# Patient Record
Sex: Female | Born: 1990
Health system: Southern US, Community
[De-identification: ages and names within clinical notes are randomized; demographics above are authoritative.]

## PROBLEM LIST (undated history)

## (undated) DIAGNOSIS — O139 Gestational [pregnancy-induced] hypertension without significant proteinuria, unspecified trimester: Secondary | ICD-10-CM

## (undated) DIAGNOSIS — Z933 Colostomy status: Secondary | ICD-10-CM

## (undated) DIAGNOSIS — C189 Malignant neoplasm of colon, unspecified: Secondary | ICD-10-CM

## (undated) DIAGNOSIS — R188 Other ascites: Secondary | ICD-10-CM

## (undated) DIAGNOSIS — J40 Bronchitis, not specified as acute or chronic: Secondary | ICD-10-CM

## (undated) HISTORY — PX: COLON SURGERY: SHX602

---

## 2012-03-26 ENCOUNTER — Emergency Department (HOSPITAL_BASED_OUTPATIENT_CLINIC_OR_DEPARTMENT_OTHER): Payer: Self-pay

## 2012-03-26 ENCOUNTER — Emergency Department (HOSPITAL_BASED_OUTPATIENT_CLINIC_OR_DEPARTMENT_OTHER)
Admission: EM | Admit: 2012-03-26 | Discharge: 2012-03-26 | Disposition: A | Discharge status: Home / Self Care | Payer: Self-pay | Attending: Emergency Medicine | Admitting: Emergency Medicine

## 2012-03-26 ENCOUNTER — Encounter (HOSPITAL_BASED_OUTPATIENT_CLINIC_OR_DEPARTMENT_OTHER): Payer: Self-pay | Admitting: *Deleted

## 2012-03-26 DIAGNOSIS — J029 Acute pharyngitis, unspecified: Secondary | ICD-10-CM | POA: Insufficient documentation

## 2012-03-26 DIAGNOSIS — R509 Fever, unspecified: Secondary | ICD-10-CM | POA: Insufficient documentation

## 2012-03-26 DIAGNOSIS — IMO0001 Reserved for inherently not codable concepts without codable children: Secondary | ICD-10-CM | POA: Insufficient documentation

## 2012-03-26 HISTORY — DX: Gestational (pregnancy-induced) hypertension without significant proteinuria, unspecified trimester: O13.9

## 2012-03-26 HISTORY — DX: Bronchitis, not specified as acute or chronic: J40

## 2012-03-26 LAB — URINALYSIS, ROUTINE W REFLEX MICROSCOPIC
Bilirubin Urine: NEGATIVE
Ketones, ur: 15 mg/dL — AB
Nitrite: NEGATIVE
Specific Gravity, Urine: 1.036 — ABNORMAL HIGH (ref 1.005–1.030)
Urobilinogen, UA: 1 mg/dL (ref 0.0–1.0)

## 2012-03-26 LAB — CBC WITH DIFFERENTIAL/PLATELET
Basophils Absolute: 0 10*3/uL (ref 0.0–0.1)
Basophils Relative: 0 % (ref 0–1)
Eosinophils Absolute: 0 10*3/uL (ref 0.0–0.7)
Eosinophils Relative: 0 % (ref 0–5)
MCH: 28.3 pg (ref 26.0–34.0)
MCHC: 35 g/dL (ref 30.0–36.0)
MCV: 80.9 fL (ref 78.0–100.0)
Platelets: 200 10*3/uL (ref 150–400)
RDW: 13.3 % (ref 11.5–15.5)

## 2012-03-26 LAB — URINE MICROSCOPIC-ADD ON

## 2012-03-26 LAB — BASIC METABOLIC PANEL
Calcium: 9.7 mg/dL (ref 8.4–10.5)
GFR calc non Af Amer: >90 mL/min (ref >90)
Glucose, Bld: 101 mg/dL — ABNORMAL HIGH (ref 70–99)
Sodium: 132 mEq/L — ABNORMAL LOW (ref 135–145)

## 2012-03-26 LAB — RAPID STREP SCREEN (MED CTR MEBANE ONLY): Streptococcus, Group A Screen (Direct): NEGATIVE

## 2012-03-26 MED ORDER — ACETAMINOPHEN 325 MG PO TABS
650.0000 mg | ORAL_TABLET | Freq: Once | ORAL | Status: AC
  Administered 2012-03-26: 650 mg via ORAL
  Filled 2012-03-26: qty 2

## 2012-03-26 MED ORDER — ONDANSETRON HCL 4 MG/2ML IJ SOLN
4.0000 mg | Freq: Once | INTRAMUSCULAR | Status: AC
  Administered 2012-03-26: 4 mg via INTRAVENOUS
  Filled 2012-03-26: qty 2

## 2012-03-26 MED ORDER — SODIUM CHLORIDE 0.9 % IV BOLUS (SEPSIS)
1000.0000 mL | Freq: Once | INTRAVENOUS | Status: AC
  Administered 2012-03-26: 1000 mL via INTRAVENOUS

## 2012-03-26 MED ORDER — IBUPROFEN 800 MG PO TABS: 800.0000 mg | ORAL_TABLET | Freq: Three times a day (TID) | ORAL | Status: DC | PRN

## 2012-03-26 MED ORDER — IOHEXOL 350 MG/ML SOLN
100.0000 mL | Freq: Once | INTRAVENOUS | Status: AC | PRN
  Administered 2012-03-26: 100 mL via INTRAVENOUS

## 2012-03-26 MED ORDER — OXYCODONE-ACETAMINOPHEN 5-325 MG PO TABS: 1.0000 | ORAL_TABLET | Freq: Four times a day (QID) | ORAL | Status: DC | PRN

## 2012-03-26 MED ORDER — PENICILLIN G BENZATHINE 1200000 UNIT/2ML IM SUSP
1.2000 10*6.[IU] | Freq: Once | INTRAMUSCULAR | Status: AC
  Administered 2012-03-26: 1.2 10*6.[IU] via INTRAMUSCULAR
  Filled 2012-03-26: qty 2

## 2012-03-26 MED ORDER — MORPHINE SULFATE 4 MG/ML IJ SOLN
4.0000 mg | Freq: Once | INTRAMUSCULAR | Status: AC
  Administered 2012-03-26: 4 mg via INTRAVENOUS
  Filled 2012-03-26: qty 1

## 2012-03-26 MED ORDER — DEXAMETHASONE SODIUM PHOSPHATE 10 MG/ML IJ SOLN
10.0000 mg | Freq: Once | INTRAMUSCULAR | Status: AC
  Administered 2012-03-26: 10 mg via INTRAVENOUS
  Filled 2012-03-26: qty 1

## 2012-03-26 NOTE — ED Notes (Signed)
Sheldon MD at bedside. 

## 2012-03-26 NOTE — ED Notes (Signed)
Pt c/o sore throat and fever onset Wed. Seen at [facility] yest. Received IV, labs "didn't tell me anything". Unable to keep fluids down. Pressure in chest and SHOB. Not given antibiotics. BBS-diminished in bases. Throat red, swollen with white patches. Strep obtained.

## 2012-03-26 NOTE — ED Provider Notes (Signed)
History     CSN: [id number]  Arrival date & time 03/26/12  1325   First MD Initiated Contact with Patient 03/26/12 1341      Chief Complaint  Patient presents with  . Sore Throat    (Consider location/radiation/quality/duration/timing/severity/associated sxs/prior treatment) HPI Pt reports several days of fever, sore throat, general malaise and myalgias, associated with mild cough and SOB. Seen at [facility] yesterday for same. By their records has 'normal tonsils'. She has labs and CXR but those results are not in the record faxed to [place]. She was not given Abx in their ED. She has had continued symptoms worsening overnight and comes to the ED today for re-evaluation. She has severe aching pain worse with swallowing.   Past Medical History  Diagnosis Date  . Pregnancy induced hypertension   . Bronchitis     History reviewed. No pertinent past surgical history.  History reviewed. No pertinent family history.  History  Substance Use Topics  . Smoking status: Never Smoker   . Smokeless tobacco: Not on file  . Alcohol Use: No    OB History    Grav Para Term Preterm Abortions TAB SAB Ect Mult Living                  Review of Systems All other systems reviewed and are negative except as noted in HPI.   Allergies  Review of patient's allergies indicates no known allergies.  Home Medications  No current outpatient prescriptions on file.  BP 117/58  Pulse 128  Temp 102.2 F (39 C) (Oral)  Resp 24  Ht 5\' 4"  (1.626 m)  Wt 205 lb (92.987 kg)  BMI 35.19 kg/m2  SpO2 99%  LMP 02/24/2012  Physical Exam  Nursing note and vitals reviewed. Constitutional: She is oriented to person, place, and time. She appears well-developed and well-nourished.  HENT:  Head: Normocephalic and atraumatic.  Mouth/Throat: Oropharyngeal exudate present.       Bilateral tonsilar enlargement and exudate R>L, no trismus, mild hot-potato voice  Eyes: EOM are normal. Pupils are equal,  round, and reactive to light.  Neck: Normal range of motion. Neck supple.  Cardiovascular: Normal rate, normal heart sounds and intact distal pulses.   Pulmonary/Chest: Effort normal and breath sounds normal.  Abdominal: Bowel sounds are normal. She exhibits no distension. There is no tenderness.  Musculoskeletal: Normal range of motion. She exhibits no edema and no tenderness.  Lymphadenopathy:    She has cervical adenopathy.  Neurological: She is alert and oriented to person, place, and time. She has normal strength. No cranial nerve deficit or sensory deficit.  Skin: Skin is warm and dry. No rash noted.  Psychiatric: She has a normal mood and affect.    ED Course  Procedures (including critical care time)  Labs Reviewed  URINALYSIS, ROUTINE W REFLEX MICROSCOPIC - Abnormal; Notable for the following:    Color, Urine AMBER (*)  BIOCHEMICALS MAY BE AFFECTED BY COLOR   Specific Gravity, Urine 1.036 (*)     Ketones, ur 15 (*)     Protein, ur 100 (*)     Leukocytes, UA SMALL (*)     All other components within normal limits  CBC WITH DIFFERENTIAL - Abnormal; Notable for the following:    Monocytes Relative 14 (*)     Monocytes Absolute 1.1 (*)     All other components within normal limits  BASIC METABOLIC PANEL - Abnormal; Notable for the following:    Sodium 132 (*)  Potassium 3.4 (*)     Chloride 94 (*)     Glucose, Bld 101 (*)     All other components within normal limits  URINE MICROSCOPIC-ADD ON - Abnormal; Notable for the following:    Squamous Epithelial / LPF MANY (*)     Bacteria, UA FEW (*)     All other components within normal limits  RAPID STREP SCREEN  PREGNANCY, URINE  STREP A DNA PROBE   Ct Soft Tissue Neck W Contrast  03/26/2012  *RADIOLOGY REPORT*  Clinical Data: Sore throat and fever.  CT NECK WITH CONTRAST  Technique:  Multidetector CT imaging of the neck was performed with intravenous contrast.  Contrast: OMNIPAQUE IOHEXOL 350 MG/ML SOLN   Comparison: None.  Findings: Prominent tonsillar pillars and palatine tonsils noted with mild hypertrophy of the lingual tonsils.  Hypertrophic adenoidal tissue noted.  No drainable abscess observed.  No prevertebral/retropharyngeal phlegmon.  Epiglottis unremarkable.  Glottic structures normal.  Scattered station II and station III lymph nodes in the neck are likely reactive.  Lung apices unremarkable.  Upper mediastinum unremarkable.  Parapharyngeal spaces symmetric.  No dental periapical lucency observed.  IMPRESSION:  1.  Prominent tonsillar tissues in the neck without abscess. Reactive station II and station III lymph nodes in the neck. 2.  Epiglottis unremarkable.   Original Report Authenticated By: Dellia Cloud, M.D.      1. Pharyngitis       MDM  Pt with fever, tachycardia and signs of pharyngitis. Strep neg, will send for culture. CT to eval for PTA, IVF, pain medications and decadron.   3:27 PM Pt feeling better, CT and labs as above. Strep neg, but treat for presumptive strep.      Magaly Pollina B. Bernette Mayers, MD 03/26/12 1527

## 2012-03-28 LAB — STREP A DNA PROBE

## 2012-11-16 ENCOUNTER — Encounter (HOSPITAL_BASED_OUTPATIENT_CLINIC_OR_DEPARTMENT_OTHER): Payer: Self-pay | Admitting: *Deleted

## 2012-11-16 ENCOUNTER — Emergency Department (HOSPITAL_BASED_OUTPATIENT_CLINIC_OR_DEPARTMENT_OTHER)
Admission: EM | Admit: 2012-11-16 | Discharge: 2012-11-16 | Disposition: A | Discharge status: Home / Self Care | Payer: Self-pay | Attending: Emergency Medicine | Admitting: Emergency Medicine

## 2012-11-16 DIAGNOSIS — R6883 Chills (without fever): Secondary | ICD-10-CM | POA: Insufficient documentation

## 2012-11-16 DIAGNOSIS — Z8709 Personal history of other diseases of the respiratory system: Secondary | ICD-10-CM | POA: Insufficient documentation

## 2012-11-16 DIAGNOSIS — J029 Acute pharyngitis, unspecified: Secondary | ICD-10-CM | POA: Insufficient documentation

## 2012-11-16 DIAGNOSIS — R110 Nausea: Secondary | ICD-10-CM | POA: Insufficient documentation

## 2012-11-16 DIAGNOSIS — IMO0001 Reserved for inherently not codable concepts without codable children: Secondary | ICD-10-CM | POA: Insufficient documentation

## 2012-11-16 LAB — RAPID STREP SCREEN (MED CTR MEBANE ONLY): Streptococcus, Group A Screen (Direct): NEGATIVE

## 2012-11-16 MED ORDER — DYCLONINE HCL 3 MG MT LOZG: 1.0000 | LOZENGE | OROMUCOSAL | Status: DC | PRN

## 2012-11-16 MED ORDER — BENZOCAINE-MENTHOL 15-10 MG MT LOZG: 1.0000 | LOZENGE | OROMUCOSAL | Status: DC | PRN

## 2012-11-16 MED ORDER — IBUPROFEN 800 MG PO TABS
800.0000 mg | ORAL_TABLET | Freq: Once | ORAL | Status: AC
  Administered 2012-11-16: 800 mg via ORAL
  Filled 2012-11-16: qty 1

## 2012-11-16 MED ORDER — IBUPROFEN 600 MG PO TABS: 600.0000 mg | ORAL_TABLET | Freq: Four times a day (QID) | ORAL | Status: DC | PRN

## 2012-11-16 MED ORDER — ONDANSETRON 4 MG PO TBDP
4.0000 mg | ORAL_TABLET | Freq: Once | ORAL | Status: AC
  Administered 2012-11-16: 4 mg via ORAL
  Filled 2012-11-16: qty 1

## 2012-11-16 NOTE — ED Notes (Signed)
Pt c/o sore throat and sinus drainage since yesterday. Denies fever.

## 2012-11-16 NOTE — ED Provider Notes (Signed)
History     CSN: [id number]  Arrival date & time 11/16/12  0148   First MD Initiated Contact with Patient 11/16/12 0214      Chief Complaint  Patient presents with  . Sore Throat   HPI Amber Silva is a 22 y.o. female presenting with sore throat and myalgias since yesterday. She denies any fevers but has had some chills. She's taken nothing for her symptoms. She denies any cough, productive cough, congestion has had some rhinorrhea. Denies any chest pains, abdominal pain, vomiting or diarrhea. 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   Past Medical History  Diagnosis Date  . Pregnancy induced hypertension   . Bronchitis     History reviewed. No pertinent past surgical history.  History reviewed. No pertinent family history.  History  Substance Use Topics  . Smoking status: Never Smoker   . Smokeless tobacco: Not on file  . Alcohol Use: No    OB History   Grav Para Term Preterm Abortions TAB SAB Ect Mult Living                  Review of Systems At least 10pt or greater review of systems completed and are negative except where specified in the HPI.  Allergies  Review of patient's allergies indicates no known allergies.  Home Medications   Current Outpatient Rx  Name  Route  Sig  Dispense  Refill  . ibuprofen (ADVIL,MOTRIN) 800 MG tablet   Oral   Take 1 tablet (800 mg total) by mouth every 8 (eight) hours as needed for pain.   30 tablet   0   . oxyCODONE-acetaminophen (PERCOCET/ROXICET) 5-325 MG per tablet   Oral   Take 1-2 tablets by mouth every 6 (six) hours as needed for pain.   20 tablet   0     BP 125/83  Pulse 103  Temp(Src) 99.3 F (37.4 C) (Oral)  Resp 18  Ht 5\' 7"  (1.702 m)  Wt 200 lb (90.719 kg)  BMI 31.32 kg/m2  SpO2 98%  LMP  10/14/2012  Physical Exam  Nursing notes reviewed.  Electronic medical record reviewed. VITAL SIGNS:   Filed Vitals:   11/16/12 0152  BP: 125/83  Pulse: 103  Temp: 99.3 F (37.4 C)  TempSrc: Oral  Resp: 18  Height: 5\' 7"  (1.702 m)  Weight: 200 lb (90.719 kg)  SpO2: 98%   CONSTITUTIONAL: Awake, oriented, appears non-toxic HENT: Atraumatic, normocephalic, oral mucosa pink and moist, airway patent. Nasal turbinates are mildly inflamed. Mildly enlarged tonsils-no exudate, no erythema. Nares patent without drainage. External ears normal. EYES: Conjunctiva clear, EOMI, PERRLA NECK: Trachea midline, non-tender, supple, no cervical lymphadenopathy CARDIOVASCULAR: Normal heart rate, Normal rhythm, No murmurs, rubs, gallops PULMONARY/CHEST: Clear to auscultation, no rhonchi, wheezes, or rales. Symmetrical breath sounds. Non-tender. ABDOMINAL: Non-distended, soft, non-tender - no rebound or guarding.  BS normal. NEUROLOGIC: Non-focal, moving all four extremities, no gross sensory or motor deficits. EXTREMITIES: No clubbing, cyanosis, or edema SKIN: Warm, Dry, No erythema, No rash  ED Course  Procedures (including critical care time)  Labs Reviewed  RAPID STREP SCREEN   No results found.   1. Influenza-like illness   2. Pharyngitis       MDM  Patient presents with pharyngitis, and body aches that began abruptly. Consider influenza-like  illness versus viral pharyngitis versus allergic rhinitis.  Patient's throat does not appear to be irritated, rapid strep was negative. Do not think she's got a retropharyngeal, peritonsillar abscess or streptococcal infection of any kind-I. do not think he require treatment with antibiotics. We'll start symptomatic treatment with ibuprofen. Have discussed symptomatic treatment with ibuprofen and over-the-counter throat lozenges to control her sore throat pain, increasing fluid intake and taking it easy. No evidence for pneumonia, I do not think any  further workup needs to be done at this time. Patient is nontoxic, afebrile, vital signs are stable. Do not think her pulse rate of 103 is representative of any toxic sequelae.         Jones Skene, MD 11/16/12 0425

## 2014-02-11 ENCOUNTER — Encounter (HOSPITAL_BASED_OUTPATIENT_CLINIC_OR_DEPARTMENT_OTHER): Payer: Self-pay | Admitting: Emergency Medicine

## 2014-02-11 ENCOUNTER — Emergency Department (HOSPITAL_BASED_OUTPATIENT_CLINIC_OR_DEPARTMENT_OTHER): Payer: Medicaid Other

## 2014-02-11 ENCOUNTER — Emergency Department (HOSPITAL_BASED_OUTPATIENT_CLINIC_OR_DEPARTMENT_OTHER)
Admission: EM | Admit: 2014-02-11 | Discharge: 2014-02-11 | Disposition: A | Discharge status: Home / Self Care | Payer: Medicaid Other | Attending: Emergency Medicine | Admitting: Emergency Medicine

## 2014-02-11 DIAGNOSIS — R109 Unspecified abdominal pain: Secondary | ICD-10-CM | POA: Diagnosis present

## 2014-02-11 DIAGNOSIS — G44209 Tension-type headache, unspecified, not intractable: Secondary | ICD-10-CM | POA: Diagnosis not present

## 2014-02-11 DIAGNOSIS — Z791 Long term (current) use of non-steroidal anti-inflammatories (NSAID): Secondary | ICD-10-CM | POA: Diagnosis not present

## 2014-02-11 DIAGNOSIS — Z3202 Encounter for pregnancy test, result negative: Secondary | ICD-10-CM | POA: Insufficient documentation

## 2014-02-11 DIAGNOSIS — R000 Tachycardia, unspecified: Secondary | ICD-10-CM | POA: Insufficient documentation

## 2014-02-11 DIAGNOSIS — A084 Viral intestinal infection, unspecified: Secondary | ICD-10-CM

## 2014-02-11 DIAGNOSIS — A088 Other specified intestinal infections: Secondary | ICD-10-CM | POA: Insufficient documentation

## 2014-02-11 DIAGNOSIS — M549 Dorsalgia, unspecified: Secondary | ICD-10-CM | POA: Diagnosis not present

## 2014-02-11 DIAGNOSIS — R0789 Other chest pain: Secondary | ICD-10-CM | POA: Insufficient documentation

## 2014-02-11 DIAGNOSIS — Z8709 Personal history of other diseases of the respiratory system: Secondary | ICD-10-CM | POA: Diagnosis not present

## 2014-02-11 LAB — COMPREHENSIVE METABOLIC PANEL
ALT: 14 U/L (ref 0–35)
ANION GAP: 11 (ref 5–15)
AST: 21 U/L (ref 0–37)
Albumin: 2.4 g/dL — ABNORMAL LOW (ref 3.5–5.2)
Alkaline Phosphatase: 76 U/L (ref 39–117)
BILIRUBIN TOTAL: 0.4 mg/dL (ref 0.3–1.2)
BUN: 5 mg/dL — AB (ref 6–23)
CALCIUM: 9.2 mg/dL (ref 8.4–10.5)
CHLORIDE: 98 meq/L (ref 96–112)
CO2: 26 meq/L (ref 19–32)
CREATININE: 0.7 mg/dL (ref 0.50–1.10)
GLUCOSE: 108 mg/dL — AB (ref 70–99)
Potassium: 3.2 mEq/L — ABNORMAL LOW (ref 3.7–5.3)
Sodium: 135 mEq/L — ABNORMAL LOW (ref 137–147)
Total Protein: 8.8 g/dL — ABNORMAL HIGH (ref 6.0–8.3)

## 2014-02-11 LAB — URINE MICROSCOPIC-ADD ON

## 2014-02-11 LAB — LIPASE, BLOOD: LIPASE: 18 U/L (ref 11–59)

## 2014-02-11 LAB — CBC WITH DIFFERENTIAL/PLATELET
Basophils Absolute: 0 10*3/uL (ref 0.0–0.1)
Basophils Relative: 0 % (ref 0–1)
EOS PCT: 0 % (ref 0–5)
Eosinophils Absolute: 0 10*3/uL (ref 0.0–0.7)
HEMATOCRIT: 30.2 % — AB (ref 36.0–46.0)
HEMOGLOBIN: 10.3 g/dL — AB (ref 12.0–15.0)
LYMPHS PCT: 20 % (ref 12–46)
Lymphs Abs: 0.8 10*3/uL (ref 0.7–4.0)
MCH: 28 pg (ref 26.0–34.0)
MCHC: 34.1 g/dL (ref 30.0–36.0)
MCV: 82.1 fL (ref 78.0–100.0)
MONO ABS: 0.6 10*3/uL (ref 0.1–1.0)
MONOS PCT: 16 % — AB (ref 3–12)
NEUTROS ABS: 2.6 10*3/uL (ref 1.7–7.7)
Neutrophils Relative %: 64 % (ref 43–77)
Platelets: 290 10*3/uL (ref 150–400)
RBC: 3.68 MIL/uL — ABNORMAL LOW (ref 3.87–5.11)
RDW: 12.9 % (ref 11.5–15.5)
WBC: 4 10*3/uL (ref 4.0–10.5)

## 2014-02-11 LAB — URINALYSIS, ROUTINE W REFLEX MICROSCOPIC
Bilirubin Urine: NEGATIVE
GLUCOSE, UA: NEGATIVE mg/dL
HGB URINE DIPSTICK: NEGATIVE
KETONES UR: NEGATIVE mg/dL
LEUKOCYTES UA: NEGATIVE
Nitrite: NEGATIVE
PH: 6.5 (ref 5.0–8.0)
PROTEIN: 30 mg/dL — AB
Specific Gravity, Urine: 1.023 (ref 1.005–1.030)
Urobilinogen, UA: >8 mg/dL — ABNORMAL HIGH (ref 0.0–1.0)

## 2014-02-11 LAB — TROPONIN I

## 2014-02-11 LAB — PREGNANCY, URINE: Preg Test, Ur: NEGATIVE

## 2014-02-11 LAB — I-STAT CG4 LACTIC ACID, ED: Lactic Acid, Venous: 1.16 mmol/L (ref 0.5–2.2)

## 2014-02-11 MED ORDER — ACETAMINOPHEN 325 MG PO TABS
650.0000 mg | ORAL_TABLET | Freq: Once | ORAL | Status: AC
  Administered 2014-02-11: 650 mg via ORAL
  Filled 2014-02-11: qty 2

## 2014-02-11 MED ORDER — SODIUM CHLORIDE 0.9 % IV BOLUS (SEPSIS)
1000.0000 mL | Freq: Once | INTRAVENOUS | Status: AC
  Administered 2014-02-11: 1000 mL via INTRAVENOUS

## 2014-02-11 MED ORDER — ONDANSETRON HCL 4 MG PO TABS: 4.0000 mg | ORAL_TABLET | Freq: Four times a day (QID) | ORAL | Status: DC

## 2014-02-11 MED ORDER — IOHEXOL 300 MG/ML  SOLN
100.0000 mL | Freq: Once | INTRAMUSCULAR | Status: AC | PRN
  Administered 2014-02-11: 100 mL via INTRAVENOUS

## 2014-02-11 MED ORDER — POTASSIUM CHLORIDE CRYS ER 20 MEQ PO TBCR: 20.0000 meq | EXTENDED_RELEASE_TABLET | Freq: Every day | ORAL | Status: DC

## 2014-02-11 MED ORDER — IOHEXOL 300 MG/ML  SOLN
50.0000 mL | Freq: Once | INTRAMUSCULAR | Status: AC | PRN
  Administered 2014-02-11: 50 mL via ORAL

## 2014-02-11 MED ORDER — SODIUM CHLORIDE 0.9 % IV BOLUS (SEPSIS)
500.0000 mL | Freq: Once | INTRAVENOUS | Status: AC
  Administered 2014-02-11: 500 mL via INTRAVENOUS

## 2014-02-11 MED ORDER — POTASSIUM CHLORIDE CRYS ER 20 MEQ PO TBCR
40.0000 meq | EXTENDED_RELEASE_TABLET | Freq: Once | ORAL | Status: AC
  Administered 2014-02-11: 40 meq via ORAL
  Filled 2014-02-11: qty 2

## 2014-02-11 NOTE — ED Provider Notes (Signed)
CSN: [id number]     Arrival date & time 02/11/14  1010 History   First MD Initiated Contact with Patient 02/11/14 1021     Chief Complaint  Patient presents with  . Abdominal Pain  . Chest Pain     (Consider location/radiation/quality/duration/timing/severity/associated sxs/prior Treatment) The history is provided by the patient. No language interpreter was used.  Gaye Scorza is a 23 y/o F with PMHx of pregnancy induced HTN and bronchitis presenting to the ED with abdominal pain, nausea, vomiting, diarrhea that started on Friday. Patient reported that the abdominal pain is localized to the epigastric region described as an aching sensation that is intermittent, depending on the patient's position. Patient stated that sitting down for long periods of time make the pain worse, sitting up makes the pain feel better. Stated that with certain foods she has been having nausea and emesis - NB/NB. Stated that she has been having diarrhea of loose brownish-greenish color - denied mucus and blood. Stated that she has been having intermittent chills, but has not taken her temperature. Reported that when she woke up this morning she started to experience chest tightness sensation. Stated that she has started to develop a headache, stated on Thursday described as an intermittent headache that gets worse with light. Stated that she's been unable to keep any food or fluids down secondary to feeling extremely nauseous afterwards having episodes of emesis. Denied cough, nasal congestion, hematuria, dysuria, hematochezia, melena, shortness of breath, difficulty breathing, chest pain, travels, neck pain, neck stiffness, blurred vision, sudden loss of vision, worse headache of life, vaginal complaints. LMP 01/25/2014. Reported that she is stopped smoking approximately 2 weeks ago. Denied history of cancer clots. PCP [facility]  Past Medical History  Diagnosis Date  . Pregnancy induced hypertension   .  Bronchitis    No past surgical history on file. No family history on file. History  Substance Use Topics  . Smoking status: Never Smoker   . Smokeless tobacco: Not on file  . Alcohol Use: No   OB History   Grav Para Term Preterm Abortions TAB SAB Ect Mult Living                 Review of Systems  Constitutional: Positive for fever and chills.  HENT: Negative for ear discharge.   Respiratory: Positive for chest tightness. Negative for cough and shortness of breath.   Cardiovascular: Negative for chest pain.  Gastrointestinal: Positive for nausea, vomiting, abdominal pain and diarrhea. Negative for constipation, blood in stool and anal bleeding.  Genitourinary: Negative for dysuria, hematuria and vaginal bleeding.  Musculoskeletal: Positive for back pain. Negative for neck pain and neck stiffness.  Neurological: Positive for headaches. Negative for dizziness, weakness and numbness.      Allergies  Review of patient's allergies indicates no known allergies.  Home Medications   Prior to Admission medications   Medication Sig Start Date End Date Taking? Authorizing Provider  Benzocaine-Menthol (CHLORASEPTIC MAX SORE THROAT) 15-10 MG LOZG Use as directed 1 lozenge in the mouth or throat every 2 (two) hours as needed. 11/16/12   John-Adam Bonk, MD  Dyclonine HCl 3 MG LOZG Use as directed 1 lozenge (3 mg total) in the mouth or throat every 2 (two) hours as needed (sore throat). 11/16/12   John-Adam Bonk, MD  ibuprofen (ADVIL,MOTRIN) 600 MG tablet Take 1 tablet (600 mg total) by mouth every 6 (six) hours as needed for pain. 11/16/12   Rhunette Croft, MD  ibuprofen (ADVIL,MOTRIN) 800  MG tablet Take 1 tablet (800 mg total) by mouth every 8 (eight) hours as needed for pain. 03/26/12   Charles B. Karle Starch, MD  ondansetron (ZOFRAN) 4 MG tablet Take 1 tablet (4 mg total) by mouth every 6 (six) hours. 02/11/14   Vanellope Passmore, PA-C  oxyCODONE-acetaminophen (PERCOCET/ROXICET) 5-325 MG per tablet  Take 1-2 tablets by mouth every 6 (six) hours as needed for pain. 03/26/12   Charles B. Karle Starch, MD   BP 133/66  Pulse 97  Temp(Src) 99.9 F (37.7 C) (Oral)  Resp 18  SpO2 100%  LMP 01/25/2014 Physical Exam  Nursing note and vitals reviewed. Constitutional: She is oriented to person, place, and time. She appears well-developed and well-nourished. No distress.  HENT:  Head: Normocephalic and atraumatic.  Mouth/Throat: Oropharynx is clear and moist. No oropharyngeal exudate.  Negative trismus. Negative swelling, erythema, inflammation, lesions, sores, exudate or petechiae identified to the tonsils bilaterally and soft palate. Uvula midline with symmetrical elevation. Negative uvula deviation. Negative uvula swelling.  Eyes: Conjunctivae and EOM are normal. Pupils are equal, round, and reactive to light. Right eye exhibits no discharge. Left eye exhibits no discharge.  Neck: Normal range of motion. Neck supple. No tracheal deviation present.  Negative neck stiffness Negative nuchal rigidity Next cervical lymphadenopathy Negative meningeal signs  Cardiovascular: Regular rhythm and normal heart sounds.  Tachycardia present.  Exam reveals no friction rub.   No murmur heard. Pulses:      Radial pulses are 2+ on the right side, and 2+ on the left side.       Dorsalis pedis pulses are 2+ on the right side, and 2+ on the left side.  Mild tachycardia upon auscultation Cap refill less than 3 seconds  Pulmonary/Chest: Effort normal and breath sounds normal. No respiratory distress. She has no wheezes. She has no rales. Chest wall is not dull to percussion. She exhibits tenderness (pain reproducible upon palpation to the chest wall) and bony tenderness. She exhibits no mass, no laceration, no crepitus and no retraction.    Patient is able to speak in full sentences without difficulty Negative use of accessory muscles Negative stridor Negative trauma noted to the chest wall, negative ecchymosis  and swelling noted Discomfort upon palpation to the chest wall-center Negative crepitus upon palpation to the chest wall  Abdominal: Soft. Normal appearance. She exhibits no distension. There is tenderness in the right upper quadrant and epigastric area. There is no rebound, no guarding and no CVA tenderness.  Obese Negative abdominal distention noted Bowel sounds normal active in all 4 quadrants Abdomen soft upon palpation Discomfort upon palpation to the right upper quadrant, epigastric region Negative guarding or rigidity Negative peritoneal signs   Musculoskeletal: Normal range of motion. She exhibits no edema and no tenderness.  Full ROM to upper and lower extremities without difficulty noted, negative ataxia noted.  Lymphadenopathy:    She has no cervical adenopathy.  Neurological: She is alert and oriented to person, place, and time. No cranial nerve deficit. She exhibits normal muscle tone. Coordination normal.  Cranial nerves III-XII grossly intact Strength 5+/5+ to upper and lower extremities bilaterally with resistance applied, equal distribution noted Equal grip strength bilaterally Negative facial droop Negative slurred speech Negative aphasia Patient is able to bring finger to nose bilaterally without difficulty or ataxia Patient follows commands well Patient is able to answer questions appropriately Negative arm drift Heel to knee down shin normal bilaterally Gait proper, proper balance - negative sway, negative drift, negative step-offs  Skin:  Skin is warm and dry. No rash noted. No erythema.  Psychiatric: She has a normal mood and affect. Her behavior is normal. Thought content normal.    ED Course  Procedures (including critical care time)  11:39 PM Patient seen and assessed by attending physician, Dr. Rebeca Alert - as per attending physician agrees to plan of labs and imaging. Does not recommend CT head or LP at this - doubt SAH, meningitis.   1:47 PM This  provider discussed labs and imaging in great detail. Discussed with patient the recommendation of the CT scan of the abdomen and pelvis. Patient understood and agreed.   Results for orders placed during the hospital encounter of 02/11/14  PREGNANCY, URINE      Result Value Ref Range   Preg Test, Ur NEGATIVE  NEGATIVE  URINALYSIS, ROUTINE W REFLEX MICROSCOPIC      Result Value Ref Range   Color, Urine AMBER (*) YELLOW   APPearance CLEAR  CLEAR   Specific Gravity, Urine 1.023  1.005 - 1.030   pH 6.5  5.0 - 8.0   Glucose, UA NEGATIVE  NEGATIVE mg/dL   Hgb urine dipstick NEGATIVE  NEGATIVE   Bilirubin Urine NEGATIVE  NEGATIVE   Ketones, ur NEGATIVE  NEGATIVE mg/dL   Protein, ur 30 (*) NEGATIVE mg/dL   Urobilinogen, UA >8.0 (*) 0.0 - 1.0 mg/dL   Nitrite NEGATIVE  NEGATIVE   Leukocytes, UA NEGATIVE  NEGATIVE  CBC WITH DIFFERENTIAL      Result Value Ref Range   WBC 4.0  4.0 - 10.5 K/uL   RBC 3.68 (*) 3.87 - 5.11 MIL/uL   Hemoglobin 10.3 (*) 12.0 - 15.0 g/dL   HCT 30.2 (*) 36.0 - 46.0 %   MCV 82.1  78.0 - 100.0 fL   MCH 28.0  26.0 - 34.0 pg   MCHC 34.1  30.0 - 36.0 g/dL   RDW 12.9  11.5 - 15.5 %   Platelets 290  150 - 400 K/uL   Neutrophils Relative % 64  43 - 77 %   Neutro Abs 2.6  1.7 - 7.7 K/uL   Lymphocytes Relative 20  12 - 46 %   Lymphs Abs 0.8  0.7 - 4.0 K/uL   Monocytes Relative 16 (*) 3 - 12 %   Monocytes Absolute 0.6  0.1 - 1.0 K/uL   Eosinophils Relative 0  0 - 5 %   Eosinophils Absolute 0.0  0.0 - 0.7 K/uL   Basophils Relative 0  0 - 1 %   Basophils Absolute 0.0  0.0 - 0.1 K/uL  COMPREHENSIVE METABOLIC PANEL      Result Value Ref Range   Sodium 135 (*) 137 - 147 mEq/L   Potassium 3.2 (*) 3.7 - 5.3 mEq/L   Chloride 98  96 - 112 mEq/L   CO2 26  19 - 32 mEq/L   Glucose, Bld 108 (*) 70 - 99 mg/dL   BUN 5 (*) 6 - 23 mg/dL   Creatinine, Ser 0.70  0.50 - 1.10 mg/dL   Calcium 9.2  8.4 - 10.5 mg/dL   Total Protein 8.8 (*) 6.0 - 8.3 g/dL   Albumin 2.4 (*) 3.5 - 5.2 g/dL     AST 21  0 - 37 U/L   ALT 14  0 - 35 U/L   Alkaline Phosphatase 76  39 - 117 U/L   Total Bilirubin 0.4  0.3 - 1.2 mg/dL   GFR calc non Af Amer >90  >90 mL/min  GFR calc Af Amer >90  >90 mL/min   Anion gap 11  5 - 15  LIPASE, BLOOD      Result Value Ref Range   Lipase 18  11 - 59 U/L  TROPONIN I      Result Value Ref Range   Troponin I <0.30  <0.30 ng/mL  URINE MICROSCOPIC-ADD ON      Result Value Ref Range   Squamous Epithelial / LPF RARE  RARE   WBC, UA 0-2  <3 WBC/hpf   RBC / HPF 0-2  <3 RBC/hpf   Bacteria, UA RARE  RARE   Urine-Other MUCOUS PRESENT    I-STAT CG4 LACTIC ACID, ED      Result Value Ref Range   Lactic Acid, Venous 1.16  0.5 - 2.2 mmol/L    Labs Review Labs Reviewed  URINALYSIS, ROUTINE W REFLEX MICROSCOPIC - Abnormal; Notable for the following:    Color, Urine AMBER (*)    Protein, ur 30 (*)    Urobilinogen, UA >8.0 (*)    All other components within normal limits  CBC WITH DIFFERENTIAL - Abnormal; Notable for the following:    RBC 3.68 (*)    Hemoglobin 10.3 (*)    HCT 30.2 (*)    Monocytes Relative 16 (*)    All other components within normal limits  COMPREHENSIVE METABOLIC PANEL - Abnormal; Notable for the following:    Sodium 135 (*)    Potassium 3.2 (*)    Glucose, Bld 108 (*)    BUN 5 (*)    Total Protein 8.8 (*)    Albumin 2.4 (*)    All other components within normal limits  PREGNANCY, URINE  LIPASE, BLOOD  TROPONIN I  URINE MICROSCOPIC-ADD ON  I-STAT CG4 LACTIC ACID, ED    Imaging Review Dg Chest 2 View  02/11/2014   CLINICAL DATA:  23 year old female with chest pain and fever.  EXAM: CHEST  2 VIEW  COMPARISON:  03/25/2012 chest radiograph  FINDINGS: The cardiomediastinal silhouette is unremarkable.  The lungs are clear.  There is no evidence of focal airspace disease, pulmonary edema, suspicious pulmonary nodule/mass, pleural effusion, or pneumothorax. No acute bony abnormalities are identified.  IMPRESSION: No evidence of active  cardiopulmonary disease.   Electronically Signed   By: Hassan Rowan M.D.   On: 02/11/2014 13:28   US Abdomen Complete  02/11/2014   CLINICAL DATA:  Abdominal pain.  EXAM: ULTRASOUND ABDOMEN COMPLETE  COMPARISON:  None.  FINDINGS: Gallbladder:  No gallstones or wall thickening visualized. No sonographic Murphy sign noted.  Common bile duct:  Diameter: Normal, 2 mm.  Liver:  No focal lesion identified. Within normal limits in parenchymal echogenicity.  IVC:  No abnormality visualized.  Pancreas:  Visualized portion unremarkable.  Spleen:  Size and appearance within normal limits.  Right Kidney:  Length: 11.2 cm. Suspicion of increased echogenicity. No mass or hydronephrosis visualized.  Left Kidney:  Length: 11.2 cm.   No mass or hydronephrosis visualized.  Abdominal aorta:  No aneurysm visualized.  Other findings:  Small to moderate volume ascites.  IMPRESSION: 1. Small to moderate volume ascites, of indeterminate etiology. 2. Subtle increased renal echogenicity suggesting, especially on the right. Cannot exclude medical renal disease. 3. If there is no known explanation for ascites, abdominal pelvic CT should be considered.   Electronically Signed   By: Abigail Miyamoto M.D.   On: 02/11/2014 13:20   Ct Abdomen Pelvis W Contrast  02/11/2014   CLINICAL DATA:  Upper abdominal pain  EXAM: CT ABDOMEN AND PELVIS WITH CONTRAST  TECHNIQUE: Multidetector CT imaging of the abdomen and pelvis was performed using the standard protocol following bolus administration of intravenous contrast.  CONTRAST:  60mL OMNIPAQUE IOHEXOL 300 MG/ML SOLN, 169mL OMNIPAQUE IOHEXOL 300 MG/ML SOLN  COMPARISON:  Ultrasound from earlier in the same day  FINDINGS: Lung bases are free of acute infiltrate or sizable effusion.  The gallbladder is decompressed. The liver, spleen, adrenal glands and pancreas are within normal limits. The kidneys are well visualized and reveal no obstructive changes. The demonstrate a normal enhancement pattern. The appendix  is air-filled. Mild to moderate ascites is noted. The bladder is well distended. No pelvic mass lesion is seen. No significant lymphadenopathy is noted. No acute bony abnormality is noted.  IMPRESSION: Mild to moderate ascites without definitive etiology on this examination.  No other focal abnormality is seen.   Electronically Signed   By: Inez Catalina M.D.   On: 02/11/2014 15:23     EKG Interpretation   Date/Time:  Monday February 11 2014 10:27:33 EDT Ventricular Rate:  111 PR Interval:  142 QRS Duration: 82 QT Interval:  302 QTC Calculation: 410 R Axis:   42 Text Interpretation:  Sinus tachycardia Nonspecific T wave abnormality No  previous ECGs available Confirmed by [facility]  MD, Jenny Reichmann ([id number]) on 02/11/2014  2:01:33 PM      MDM   Final diagnoses:  Viral gastroenteritis  Tension-type headache, not intractable, unspecified chronicity pattern    Medications  potassium chloride SA (K-DUR,KLOR-CON) CR tablet 40 mEq (not administered)  sodium chloride 0.9 % bolus 1,000 mL (0 mLs Intravenous Stopped 02/11/14 1142)  acetaminophen (TYLENOL) tablet 650 mg (650 mg Oral Given 02/11/14 1143)  sodium chloride 0.9 % bolus 500 mL (0 mLs Intravenous Stopped 02/11/14 1418)  iohexol (OMNIPAQUE) 300 MG/ML solution 50 mL (50 mLs Oral Contrast Given 02/11/14 1400)  iohexol (OMNIPAQUE) 300 MG/ML solution 100 mL (100 mLs Intravenous Contrast Given 02/11/14 1502)   Filed Vitals:   02/11/14 1040 02/11/14 1130 02/11/14 1303 02/11/14 1657  BP: 136/89 119/79 128/70 133/66  Pulse: 105 102 95 97  Temp:   98.8 F (37.1 C) 99.9 F (37.7 C)  TempSrc:   Oral Oral  Resp: 13 15 18 18   SpO2: 100% 100% 100% 100%    EKG noted sinus tachycardia with a heart rate of 111 beats per minute with nonspecific T-wave abnormality-no previous EKGs for comparison. Troponin negative elevation. CBC negative elevated white blood cell count-negative left shift. Mildly low hemoglobin of 10.3, hematocrit 30.2-the patient appears to  be anemic. CMP noted mildly low potassium of 3.2. Negative anion gap. Lipase negative elevation. Urinalysis negative nitrites, leukocytes, hemoglobin-negative findings of infection. Lactic acid negative elevation. Urine pregnancy negative. Chest x-ray negative for acute cardiopulmonary disease. Abdominal ultrasound identified small-to-moderate volume ascites with unknown etiology-increased renal echogenicity localized to the right side cannot exclude medical renal disease-abdomen and pelvic CT recommended. CT abdomen pelvis with contrast noted a mild to moderate ascites without definitive etiology-no focal abnormalities Patient seen and assessed by attending physician, Dr. Rebeca Alert. Doubt cholecystitis/cholangitis. Doubt pancreatitis. Doubt appendicitis. Negative acute abdominal processes identified. Doubt meningitis. Doubt SAH. Doubt CVA. CT abdomen pelvis identified ascites without etiology. Labs unremarkable. Suspicion to be high for viral gastroenteritis secondary to nausea, vomiting, diarrhea. Suspicion of chest tightness appears to be GERD-like reaction. Patient tolerated fluids by mouth without difficulty-negative episodes of emesis while in ED setting. Patient's fever controlled in ED setting with  Tylenol. Heart rate decreased from 121 to 98 beats per minute after IV fluids administered. Patient stable, afebrile. Patient not septic appearing. Discharged patient. Referred to primary care provider. Discussed with patient to rest and stay hydrated. Discharged patient with zofran and potassium. Discussed with patient proper diet. Discussed with patient to closely monitor symptoms and if symptoms are to worsen or change to report back to the ED - strict return instructions given.  Patient agreed to plan of care, understood, all questions answered.   Jamse Mead, PA-C 02/11/14 1737

## 2014-02-11 NOTE — ED Provider Notes (Signed)
Medical screening examination/treatment/procedure(s) were conducted as a shared visit with non-physician practitioner(s) and myself.  I personally evaluated the patient during the encounter.   EKG Interpretation   Date/Time:  Monday February 11 2014 10:27:33 EDT Ventricular Rate:  111 PR Interval:  142 QRS Duration: 82 QT Interval:  302 QTC Calculation: 410 R Axis:   42 Text Interpretation:  Sinus tachycardia Nonspecific T wave abnormality No  previous ECGs available Confirmed by [facility]  MD, Jenny Reichmann ([id number]) on 02/11/2014  2:01:33 PM     5 days of fever generalized body aches with intermittent gradual onset headache that resolved with ibuprofen, 4 days of generalized body aches backache sharp stabbing chest pain worse with position changes and palpation as well as epigastric abdominal pain constant waxing and waning for the last 4 days with multiple episodes of nonbloody vomiting and diarrhea with minimal epigastric tenderness to examination with the rest of the abdomen nontender; no confusion no change in speech or vision no lateralizing weakness or numbness just generalized weakness with chills with no stiff neck no rash clinically doubt bacterial meningitis, SAH, CVA.  Babette Relic, MD 02/19/14 1255

## 2014-02-11 NOTE — Discharge Instructions (Signed)
Please call your doctor for a followup appointment within 24-48 hours. When you talk to your doctor please let them know that you were seen in the emergency department and have them acquire all of your records so that they can discuss the findings with you and formulate a treatment plan to fully care for your new and ongoing problems. Please call and set up an appointment with your primary care provider Please take Zofran as needed for nausea Please drink plenty of water and fluids Please avoid foods high in fat, degrees, carbohydrates Please take potassium tablets as prescribed-please have potassium rechecked within approximately 5 days Please continue to monitor symptoms closely and if symptoms are to worsen or change (fever greater than 101, chills, chest pain, shortness of breath, difficulty breathing, numbness, tingling, worsening or changes to pain pattern, blood in the stools, black tarry stools, weakness, numbness, syncope, headache, dizziness, blurred vision, sudden loss of vision, neck pain, neck stiffness, worsening or changes to abdominal pain, inability to keep food or fluids down) please report back to emergency department immediately  Viral Gastroenteritis Viral gastroenteritis is also known as stomach flu. This condition affects the stomach and intestinal tract. It can cause sudden diarrhea and vomiting. The illness typically lasts 3 to 8 days. Most people develop an immune response that eventually gets rid of the virus. While this natural response develops, the virus can make you quite ill. CAUSES  Many different viruses can cause gastroenteritis, such as rotavirus or noroviruses. You can catch one of these viruses by consuming contaminated food or water. You may also catch a virus by sharing utensils or other personal items with an infected person or by touching a contaminated surface. SYMPTOMS  The most common symptoms are diarrhea and vomiting. These problems can cause a severe loss  of body fluids (dehydration) and a body salt (electrolyte) imbalance. Other symptoms may include:  Fever.  Headache.  Fatigue.  Abdominal pain. DIAGNOSIS  Your caregiver can usually diagnose viral gastroenteritis based on your symptoms and a physical exam. A stool sample may also be taken to test for the presence of viruses or other infections. TREATMENT  This illness typically goes away on its own. Treatments are aimed at rehydration. The most serious cases of viral gastroenteritis involve vomiting so severely that you are not able to keep fluids down. In these cases, fluids must be given through an intravenous line (IV). HOME CARE INSTRUCTIONS   Drink enough fluids to keep your urine clear or pale yellow. Drink small amounts of fluids frequently and increase the amounts as tolerated.  Ask your caregiver for specific rehydration instructions.  Avoid:  Foods high in sugar.  Alcohol.  Carbonated drinks.  Tobacco.  Juice.  Caffeine drinks.  Extremely hot or cold fluids.  Fatty, greasy foods.  Too much intake of anything at one time.  Dairy products until 24 to 48 hours after diarrhea stops.  You may consume probiotics. Probiotics are active cultures of beneficial bacteria. They may lessen the amount and number of diarrheal stools in adults. Probiotics can be found in yogurt with active cultures and in supplements.  Wash your hands well to avoid spreading the virus.  Only take over-the-counter or prescription medicines for pain, discomfort, or fever as directed by your caregiver. Do not give aspirin to children. Antidiarrheal medicines are not recommended.  Ask your caregiver if you should continue to take your regular prescribed and over-the-counter medicines.  Keep all follow-up appointments as directed by your caregiver. SEEK IMMEDIATE  MEDICAL CARE IF:   You are unable to keep fluids down.  You do not urinate at least once every 6 to 8 hours.  You develop  shortness of breath.  You notice blood in your stool or vomit. This may look like coffee grounds.  You have abdominal pain that increases or is concentrated in one small area (localized).  You have persistent vomiting or diarrhea.  You have a fever.  The patient is a child younger than 3 months, and he or she has a fever.  The patient is a child older than 3 months, and he or she has a fever and persistent symptoms.  The patient is a child older than 3 months, and he or she has a fever and symptoms suddenly get worse.  The patient is a baby, and he or she has no tears when crying. MAKE SURE YOU:   Understand these instructions.  Will watch your condition.  Will get help right away if you are not doing well or get worse. Document Released: 06/21/2005 Document Revised: 09/13/2011 Document Reviewed: 04/07/2011 [facility] Patient Information 2015 [place], [place]. This information is not intended to replace advice given to you by your health care provider. Make sure you discuss any questions you have with your health care provider.   Emergency Department Resource Guide 1) Find a Doctor and Pay Out of Pocket Although you won't have to find out who is covered by your insurance plan, it is a good idea to ask around and get recommendations. You will then need to call the office and see if the doctor you have chosen will accept you as a new patient and what types of options they offer for patients who are self-pay. Some doctors offer discounts or will set up payment plans for their patients who do not have insurance, but you will need to ask so you aren't surprised when you get to your appointment.  2) Contact Your Local Health Department Not all health departments have doctors that can see patients for sick visits, but many do, so it is worth a call to see if yours does. If you don't know where your local health department is, you can check in your phone book. The CDC also has a tool to help  you locate your state's health department, and many state websites also have listings of all of their local health departments.  3) Find a [facility] If your illness is not likely to be very severe or complicated, you may want to try a walk in clinic. These are popping up all over the country in pharmacies, drugstores, and shopping centers. They're usually staffed by nurse practitioners or physician assistants that have been trained to treat common illnesses and complaints. They're usually fairly quick and inexpensive. However, if you have serious medical issues or chronic medical problems, these are probably not your best option.  No Primary Care Doctor: - Call Health Connect at  [phone] - they can help you locate a primary care doctor that  accepts your insurance, provides certain services, etc. - Physician Referral Service- [phone]  Chronic Pain Problems: Organization         Address  Phone   Notes  [facility]  [phone] Patients need to be referred by their primary care doctor.   Medication Assistance: Organization         Address  Phone   Notes  [facility] Medication Assistance Program [address] [place], [place]  [address] [phone] --Must be a resident of [facility] -- Must have NO insurance coverage whatsoever (no Medicaid/ Medicare, etc.) -- The pt. MUST have a primary care doctor that directs their care regularly and follows them in the community   MedAssist  [phone]   [employer]  [phone]    Agencies that provide inexpensive medical care: Organization         Address  Phone   Notes  [place]  [phone]   [facility]    [phone]   [facility] [place], [place] [address] [phone]   [place] [address] [place] [phone]   Planned Parenthood    [phone]   [facility]    [phone]   [place] [address], [place] Phone:  [phone], Fax:  [phone] Hours of Operation:  9 am - 6 pm, M-F.  Also accepts Medicaid/Medicare and self-pay.  [facility] for [place] [address], [address], [place] Phone: [phone], Fax: [phone]. Hours of Operation:  8:30 am - 5:30 pm, M-F.  Also accepts Medicaid and self-pay.  [facility] [place] [address] [place] Phone: [phone]   [place], [place], [place] [phone], Ext. [phone] Mondays & Thursdays: 7-9 AM.  First 15 patients are seen on a first come, first serve basis.    [place] Providers:  Organization         Address  Phone   Notes  [facility] [address], [place] [phone] Also accepts self-pay patients.  [facility] [address], [place]  [phone]   [place], [address], [place] [phone]   [facility] [address], [place] [phone]   Lucianne Lei [address], [place]   [phone] Only accepts [place] Access [place] patients after they have their name applied to their card.   Self-Pay (no insurance) in [facility]:  Organization         Address  Phone   Notes  Sickle Cell Patients, [facility] [place] [phone]   [facility] [place] [phone]   [facility] [place]  [place], [place], [place] [phone]   [facility]/Dr. Osei-Bonsu  [address], [place] or [address], [place] [phone] Phone number for both [place] and [place] locations is the same.  Urgent Medical and [facility] [address],  [place] [phone]   [facility] [address] [place] or [address] [phone]   [facility] [address] [place] [phone], phone; [phone], fax Sees patients 1st and 3rd Saturday of every month.  Must not qualify for public or private insurance (i.e. Medicaid, Medicare, [place] Health Choice, Veterans' Benefits)  Household income should be no more than 200% of the poverty level The clinic cannot treat you if you are pregnant or think you are pregnant  Sexually transmitted diseases are not treated at the clinic.    Dental Care: Organization  Address  Phone  Notes  [facility] [place], [place] [phone] Accepts children up to age 26 who are enrolled in [place] or [place]; pregnant women with a Medicaid card; and children who have applied for Medicaid or [place] Health Choice, but were declined, whose parents can pay a reduced fee at time of service.  [facility] Department of [facility]  [address], [place] [phone] Accepts children up to age 64 who are enrolled in [place] or [place]; pregnant women with a Medicaid card; and children who have applied for Medicaid or [place] Health Choice, but were declined, whose parents can pay a reduced fee at time of service.  [place] Adult Dental Access PROGRAM  [place] [phone] Patients are seen by appointment only. Walk-ins are not accepted. [place] will see patients 78 years of age and older. Monday - Tuesday (8am-5pm) Most Wednesdays (8:30-5pm) $30 per visit, cash only  [facility] Adult Dental Access PROGRAM  [address], [facility] [phone] Patients are seen by appointment only. Walk-ins are not accepted. [place] will see patients 7 years of age and older. One Wednesday Evening  (Monthly: Volunteer Based).  $30 per visit, cash only  [place]  [phone] for adults; Children under age 75, call Graduate Pediatric Dentistry at [phone]. Children aged 61-14, please call [phone] to request a pediatric application.  Dental services are provided in all areas of dental care including fillings, crowns and bridges, complete and partial dentures, implants, gum treatment, root canals, and extractions. Preventive care is also provided. Treatment is provided to both adults and children. Patients are selected via a lottery and there is often a waiting list.   [facility] [address] [place]  [phone] [email or web address]   [facility] [address] [place], [place] [phone], Ext. [phone] Second and Fourth Thursday of each month, opens at 6:30 AM; Clinic ends at 9 AM.  Patients are seen on a first-come first-served basis, and a limited number are seen during each clinic.   [facility]  [address] Hillard Danker [place], [place] [phone]   Eligibility Requirements You must have lived in [place], [place], or [place] counties for at least the last three months.   You cannot be eligible for state or federal sponsored [employer], including [employer], [place], or [employer].   You generally cannot be eligible for healthcare insurance through your employer.    How to apply: Eligibility screenings are held every Tuesday and Wednesday afternoon from 1:00 pm until 4:00 pm. You do not need an appointment for the interview!  [facility] [address], [place], [place]   [place]  [place] Department  [place]  [phone]    Behavioral Health Resources in the Community: Intensive Outpatient Programs Organization         Address  Phone  Notes  [place]. [address] [place], [place] [phone]   [facility] [address] [place], [place]   ADS: Alcohol & Drug S[facility] [address] [place], [facility]   [place] [address],  [place], [place] or [phone]   Substance Abuse Resources  Organization         Address  Phone  Notes  Alcohol and Drug Services  [place]  [phone]   [facility] [place]  [phone]   Chinita Pester  [phone]   Residential & Outpatient Substance Abuse Program  [phone]   Psychological Services Organization         Address  Phone  Notes  [facility] [place]  [place]  [phone]   [place] [address], [place] or [phone]    Mobile Crisis Teams Organization         Address  Phone  Notes  [facility], Mobile Crisis Care Unit  [phone]   [facility] Psychotherapeutic Services  [address] [place], [place]   [place] [address] [place] [phone]    Self-Help/Support Groups Organization         Address  Phone             Notes  [place]. of [place] - variety of support groups  [place] Call for more information  Narcotics Anonymous (NA), [facility]aring [facility] [address], [employer] [place]  2 meetings at this location   Special educational needs teacher         Address  Phone  Notes  [facility] Residential Treatment [place],    [place]  [phone]   [facility]  [address] [place] [phone], [place], [place]   [place], [place] [phone] Admissions: 8am-3pm M-F  Incentives Substance [place] [address],    [place], [place] [phone]   [facility] [address] [place],  [place], [place]   [facility] [address]  [place], [place]   [facility] Programs - Intensive Outpatient [address], [place], [place]   [facility] ([place].,  [place], [place] [phone] or [phone]   [facility]idential Treatment Services (RTS) [address], [place], [place] Accepts Medicaid  [facility] [place] [address]  [place] [phone] Substance Abuse/Addiction Treatment   [facility] Organization         Address  Phone  Notes  [facility]  [phone]   Domenic Schwab, PhD [address] Arlis Porta [place], [place]   [phone] or [phone]   [facility] [place], [place] [phone]   [facility] [address], [place], [place] [phone] Insurance/Medicaid/sponsorship through [facility] and Families [address] [place]                                    [place], [place] [phone] [place] [address], [place] [phone]    Dr. Adele Schilder  [phone]   [facility] Dept. 1) [address] [place] 2) [place] 3)  [address], [place] [phone]  [phone]   [place] [phone] or [phone] (After Hours)

## 2014-02-11 NOTE — ED Notes (Addendum)
N/V/D with upper abdominal pain since Thursday.   Some fever since yesterday.  Pt had some chest tightness since this am.  Also c/o headache.

## 2014-02-11 NOTE — ED Notes (Signed)
Patient changed into gown from waist up.

## 2014-02-11 NOTE — ED Notes (Signed)
RN & PA at bedside.

## 2015-09-11 ENCOUNTER — Encounter (HOSPITAL_BASED_OUTPATIENT_CLINIC_OR_DEPARTMENT_OTHER): Payer: Self-pay | Admitting: *Deleted

## 2015-09-11 ENCOUNTER — Emergency Department (HOSPITAL_BASED_OUTPATIENT_CLINIC_OR_DEPARTMENT_OTHER)
Admission: EM | Admit: 2015-09-11 | Discharge: 2015-09-11 | Disposition: A | Discharge status: Home / Self Care | Payer: Medicaid Other | Attending: Emergency Medicine | Admitting: Emergency Medicine

## 2015-09-11 DIAGNOSIS — Z8709 Personal history of other diseases of the respiratory system: Secondary | ICD-10-CM | POA: Diagnosis not present

## 2015-09-11 DIAGNOSIS — R1084 Generalized abdominal pain: Secondary | ICD-10-CM | POA: Insufficient documentation

## 2015-09-11 DIAGNOSIS — Z3202 Encounter for pregnancy test, result negative: Secondary | ICD-10-CM | POA: Diagnosis not present

## 2015-09-11 DIAGNOSIS — R1013 Epigastric pain: Secondary | ICD-10-CM | POA: Diagnosis present

## 2015-09-11 DIAGNOSIS — M25512 Pain in left shoulder: Secondary | ICD-10-CM | POA: Diagnosis not present

## 2015-09-11 LAB — COMPREHENSIVE METABOLIC PANEL
ALBUMIN: 3.6 g/dL (ref 3.5–5.0)
ALT: 14 U/L (ref 14–54)
ANION GAP: 8 (ref 5–15)
AST: 18 U/L (ref 15–41)
Alkaline Phosphatase: 69 U/L (ref 38–126)
BILIRUBIN TOTAL: 0.5 mg/dL (ref 0.3–1.2)
BUN: 15 mg/dL (ref 6–20)
CO2: 25 mmol/L (ref 22–32)
Calcium: 9 mg/dL (ref 8.9–10.3)
Chloride: 105 mmol/L (ref 101–111)
Creatinine, Ser: 0.65 mg/dL (ref 0.44–1.00)
GFR calc Af Amer: >60 mL/min (ref >60)
GFR calc non Af Amer: >60 mL/min (ref >60)
GLUCOSE: 97 mg/dL (ref 65–99)
Potassium: 4.1 mmol/L (ref 3.5–5.1)
SODIUM: 138 mmol/L (ref 135–145)
TOTAL PROTEIN: 7.6 g/dL (ref 6.5–8.1)

## 2015-09-11 LAB — URINALYSIS, ROUTINE W REFLEX MICROSCOPIC
Bilirubin Urine: NEGATIVE
GLUCOSE, UA: NEGATIVE mg/dL
Hgb urine dipstick: NEGATIVE
Ketones, ur: NEGATIVE mg/dL
LEUKOCYTES UA: NEGATIVE
NITRITE: NEGATIVE
PH: 6 (ref 5.0–8.0)
Protein, ur: NEGATIVE mg/dL
SPECIFIC GRAVITY, URINE: 1.03 (ref 1.005–1.030)

## 2015-09-11 LAB — CBC WITH DIFFERENTIAL/PLATELET
BASOS ABS: 0 10*3/uL (ref 0.0–0.1)
BASOS PCT: 0 %
EOS ABS: 0 10*3/uL (ref 0.0–0.7)
EOS PCT: 1 %
HCT: 37.1 % (ref 36.0–46.0)
HEMOGLOBIN: 12.7 g/dL (ref 12.0–15.0)
LYMPHS ABS: 1.5 10*3/uL (ref 0.7–4.0)
Lymphocytes Relative: 44 %
MCH: 30 pg (ref 26.0–34.0)
MCHC: 34.2 g/dL (ref 30.0–36.0)
MCV: 87.5 fL (ref 78.0–100.0)
Monocytes Absolute: 0.6 10*3/uL (ref 0.1–1.0)
Monocytes Relative: 16 %
NEUTROS PCT: 39 %
Neutro Abs: 1.3 10*3/uL — ABNORMAL LOW (ref 1.7–7.7)
Platelets: 198 10*3/uL (ref 150–400)
RBC: 4.24 MIL/uL (ref 3.87–5.11)
RDW: 13 % (ref 11.5–15.5)
WBC: 3.4 10*3/uL — ABNORMAL LOW (ref 4.0–10.5)

## 2015-09-11 LAB — LIPASE, BLOOD: Lipase: 27 U/L (ref 11–51)

## 2015-09-11 LAB — PREGNANCY, URINE: Preg Test, Ur: NEGATIVE

## 2015-09-11 MED ORDER — METHOCARBAMOL 500 MG PO TABS: 500.0000 mg | ORAL_TABLET | Freq: Two times a day (BID) | ORAL | Status: DC

## 2015-09-11 MED ORDER — NAPROXEN 500 MG PO TABS
500.0000 mg | ORAL_TABLET | Freq: Two times a day (BID) | ORAL | Status: DC
Start: 2015-09-11 — End: 2017-01-04

## 2015-09-11 MED FILL — NAPROXEN 500 MG TABLET: 500 | 15 days supply | Qty: 30 | Fill #0

## 2015-09-11 MED FILL — METHOCARBAMOL 500 MG TABLET: 500 | 10 days supply | Qty: 20 | Fill #0

## 2015-09-11 NOTE — ED Provider Notes (Signed)
CSN: [id number]     Arrival date & time 09/11/15  0945 History   First MD Initiated Contact with Patient 09/11/15 0957     No chief complaint on file.    (Consider location/radiation/quality/duration/timing/severity/associated sxs/prior Treatment) HPI Comments: Patient presents today with two separate complaints.  She is complaining of left shoulder pain and also abdominal pain.  She denies any recent injury or trauma to the shoulder or to the abdomen.  Left shoulder pain located in the area of the trapezius.  She reports that the pain is worse with movement and lifting heavy objects.  She states that she does a lot of heavy lifting at work.  Pain radiates to the left side of her neck.  She has taken Ibuprofen for the pain with mild relief.  She denies numbness, tingling, chest pain, or SOB.  Patient is also complaining of abdominal pain.  Pain located in the epigastrium and has been present since yesterday.  Pain unchanged from onset.  She is unable to characterize the pain.  Ibuprofen has not helped with this pain.  She denies nausea, vomiting, diarrhea, constipation, urinary symptoms, or vaginal discharge.  The history is provided by the patient.    Past Medical History  Diagnosis Date  . Pregnancy induced hypertension   . Bronchitis    History reviewed. No pertinent past surgical history. No family history on file. Social History  Substance Use Topics  . Smoking status: Never Smoker   . Smokeless tobacco: None  . Alcohol Use: No   OB History    No data available     Review of Systems  All other systems reviewed and are negative.     Allergies  Review of patient's allergies indicates no known allergies.  Home Medications   Prior to Admission medications   Not on File   BP 126/67 mmHg  Pulse 64  Temp(Src) 98 F (36.7 C) (Oral)  Resp 18  Wt 90.719 kg  SpO2 100%  LMP 08/14/2015 Physical Exam  Constitutional: She appears well-developed and well-nourished.  HENT:   Head: Normocephalic and atraumatic.  Mouth/Throat: Oropharynx is clear and moist.  Neck: Normal range of motion. Neck supple.  Cardiovascular: Normal rate, regular rhythm and normal heart sounds.   Pulses:      Radial pulses are 2+ on the right side, and 2+ on the left side.  Pulmonary/Chest: Effort normal and breath sounds normal.  Abdominal: Soft. Bowel sounds are normal. She exhibits no distension and no mass. There is tenderness. There is no rebound and no guarding.  Mild diffuse tenderness to palpation, worse in the epigastrium  Musculoskeletal: Normal range of motion.  Full ROM of the left shoulder.  Pain with ROM of the left shoulder.   Tenderness to palpation and tightness of the left trapezius muscle  Neurological: She is alert.  Distal sensation of both hands intact  Skin: Skin is warm and dry.  Psychiatric: She has a normal mood and affect.  Nursing note and vitals reviewed.   ED Course  Procedures (including critical care time) Labs Review Labs Reviewed  CBC WITH DIFFERENTIAL/PLATELET  COMPREHENSIVE METABOLIC PANEL  LIPASE, BLOOD  URINALYSIS, ROUTINE W REFLEX MICROSCOPIC (NOT AT [facility])  POC URINE PREG, ED    Imaging Review No results found. I have personally reviewed and evaluated these images and lab results as part of my medical decision-making.   EKG Interpretation None      MDM   Final diagnoses:  None   Patient presents  today with two separate complaints.  She is complaining of left shoulder pain and also abdominal pain.  Left shoulder pain located over the trapezius and most consistent with muscular strain.  No acute injury or trauma.  Full ROM.  Abdominal pain is diffuse.  No localized tenderness.  No rebound or guarding.  Labs unremarkable.  UA and Urine pregnancy are negative.  Feel that the patient is stable for discharge.  Return precautions given.   Hyman Bible, PA-C 09/11/15 [place], PA-C 09/11/15 [id number]  Fredia Sorrow,  MD 09/12/15 [phone]

## 2015-09-11 NOTE — ED Notes (Signed)
Directed to pharmacy to pick up medications 

## 2015-09-11 NOTE — ED Notes (Signed)
C/o pain left shoulder worse with lifting arm or turn neck. C/o mid abd pain. No n/v/d. No urination sx or vag. Discharge. Onset 2 days ago. States she does a lot of lifting at work. No recent injury.

## 2016-12-05 ENCOUNTER — Emergency Department (HOSPITAL_BASED_OUTPATIENT_CLINIC_OR_DEPARTMENT_OTHER)
Admission: EM | Admit: 2016-12-05 | Discharge: 2016-12-05 | Disposition: A | Discharge status: Home / Self Care | Payer: Medicaid Other | Attending: Emergency Medicine | Admitting: Emergency Medicine

## 2016-12-05 ENCOUNTER — Encounter (HOSPITAL_BASED_OUTPATIENT_CLINIC_OR_DEPARTMENT_OTHER): Payer: Self-pay

## 2016-12-05 DIAGNOSIS — R1030 Lower abdominal pain, unspecified: Secondary | ICD-10-CM

## 2016-12-05 DIAGNOSIS — D649 Anemia, unspecified: Secondary | ICD-10-CM | POA: Insufficient documentation

## 2016-12-05 DIAGNOSIS — Z79899 Other long term (current) drug therapy: Secondary | ICD-10-CM | POA: Insufficient documentation

## 2016-12-05 DIAGNOSIS — K5909 Other constipation: Secondary | ICD-10-CM | POA: Insufficient documentation

## 2016-12-05 DIAGNOSIS — K5900 Constipation, unspecified: Secondary | ICD-10-CM

## 2016-12-05 DIAGNOSIS — R102 Pelvic and perineal pain: Secondary | ICD-10-CM

## 2016-12-05 LAB — URINALYSIS, MICROSCOPIC (REFLEX)

## 2016-12-05 LAB — COMPREHENSIVE METABOLIC PANEL
ALBUMIN: 3.2 g/dL — AB (ref 3.5–5.0)
ALK PHOS: 75 U/L (ref 38–126)
ALT: 11 U/L — AB (ref 14–54)
AST: 16 U/L (ref 15–41)
Anion gap: 7 (ref 5–15)
BILIRUBIN TOTAL: 0.3 mg/dL (ref 0.3–1.2)
BUN: 6 mg/dL (ref 6–20)
CALCIUM: 8.9 mg/dL (ref 8.9–10.3)
CO2: 24 mmol/L (ref 22–32)
Chloride: 107 mmol/L (ref 101–111)
Creatinine, Ser: 0.59 mg/dL (ref 0.44–1.00)
GFR calc Af Amer: >60 mL/min (ref >60)
GFR calc non Af Amer: >60 mL/min (ref >60)
GLUCOSE: 84 mg/dL (ref 65–99)
Potassium: 3.1 mmol/L — ABNORMAL LOW (ref 3.5–5.1)
SODIUM: 138 mmol/L (ref 135–145)
TOTAL PROTEIN: 6.6 g/dL (ref 6.5–8.1)

## 2016-12-05 LAB — CBC WITH DIFFERENTIAL/PLATELET
BASOS ABS: 0 10*3/uL (ref 0.0–0.1)
BASOS PCT: 0 %
Eosinophils Absolute: 0 10*3/uL (ref 0.0–0.7)
Eosinophils Relative: 0 %
HEMATOCRIT: 28.9 % — AB (ref 36.0–46.0)
HEMOGLOBIN: 9.6 g/dL — AB (ref 12.0–15.0)
Lymphocytes Relative: 31 %
Lymphs Abs: 1.4 10*3/uL (ref 0.7–4.0)
MCH: 24.7 pg — ABNORMAL LOW (ref 26.0–34.0)
MCHC: 33.2 g/dL (ref 30.0–36.0)
MCV: 74.5 fL — ABNORMAL LOW (ref 78.0–100.0)
Monocytes Absolute: 0.5 10*3/uL (ref 0.1–1.0)
Monocytes Relative: 11 %
NEUTROS ABS: 2.7 10*3/uL (ref 1.7–7.7)
NEUTROS PCT: 58 %
Platelets: 313 10*3/uL (ref 150–400)
RBC: 3.88 MIL/uL (ref 3.87–5.11)
RDW: 16.1 % — ABNORMAL HIGH (ref 11.5–15.5)
WBC: 4.6 10*3/uL (ref 4.0–10.5)

## 2016-12-05 LAB — URINALYSIS, ROUTINE W REFLEX MICROSCOPIC
BILIRUBIN URINE: NEGATIVE
Glucose, UA: NEGATIVE mg/dL
Ketones, ur: 15 mg/dL — AB
Nitrite: POSITIVE — AB
PROTEIN: NEGATIVE mg/dL
Specific Gravity, Urine: 1.018 (ref 1.005–1.030)
pH: 6.5 (ref 5.0–8.0)

## 2016-12-05 LAB — WET PREP, GENITAL
Clue Cells Wet Prep HPF POC: NONE SEEN
Sperm: NONE SEEN
Trich, Wet Prep: NONE SEEN
Yeast Wet Prep HPF POC: NONE SEEN

## 2016-12-05 LAB — LIPASE, BLOOD: Lipase: 16 U/L (ref 11–51)

## 2016-12-05 LAB — OCCULT BLOOD X 1 CARD TO LAB, STOOL: Fecal Occult Bld: POSITIVE — AB

## 2016-12-05 LAB — HCG, SERUM, QUALITATIVE: Preg, Serum: NEGATIVE

## 2016-12-05 MED ORDER — AZITHROMYCIN 1 G PO PACK
1.0000 g | PACK | Freq: Once | ORAL | Status: AC
  Administered 2016-12-05: 1 g via ORAL
  Filled 2016-12-05: qty 1

## 2016-12-05 MED ORDER — CEFTRIAXONE SODIUM 250 MG IJ SOLR
250.0000 mg | Freq: Once | INTRAMUSCULAR | Status: AC
  Administered 2016-12-05: 250 mg via INTRAMUSCULAR
  Filled 2016-12-05: qty 250

## 2016-12-05 MED ORDER — SODIUM CHLORIDE 0.9 % IV BOLUS (SEPSIS)
1000.0000 mL | Freq: Once | INTRAVENOUS | Status: AC
  Administered 2016-12-05: 1000 mL via INTRAVENOUS

## 2016-12-05 NOTE — ED Notes (Signed)
ED Provider at bedside. 

## 2016-12-05 NOTE — ED Provider Notes (Signed)
[facility] DE[facility] Provider Note   CSN: [id number] Arrival date & time: 12/05/16  1059     History   Chief Complaint Chief Complaint  Patient presents with  . Abdominal Pain    HPI Amber Silva is a 26 y.o. female who presents with bilateral lower abdominal pain for approximately 1 week. She reports that she had one episode of vomiting on Tuesday however has otherwise been without nausea or vomiting. She reports that she feels like she may be constipated.  She used to take  MiraLAX every other day however, Reports that she stopped taking it a few weeks ago. She reports that she has not had a bowel movement since Tuesday. She reports that she feels like she has to defecate however when she tries there is only a small amount applied that comes out into the toilet additionally she notices blood when she wipes.  She denies any dietary changes.  She is sexually active and does not use condoms.  She denies abnormal vaginal discharge, no vaginal pain. Denies urinary symptoms including frequency, urgency, burning/stinging with urination.  She reports that she has been consistently having on and off spotting/bleeding since she had nexplanon inserted approximately 3 months ago.  She is approximately 4 months postpartum.  She reports she has a history of anemia.  HPI  Past Medical History:  Diagnosis Date  . Bronchitis   . Pregnancy induced hypertension     There are no active problems to display for this patient.   History reviewed. No pertinent surgical history.  OB History    No data available       Home Medications    Prior to Admission medications   Medication Sig Start Date End Date Taking? Authorizing Provider  methocarbamol (ROBAXIN) 500 MG tablet Take 1 tablet (500 mg total) by mouth 2 (two) times daily. 09/11/15   Hyman Bible, PA-C  naproxen (NAPROSYN) 500 MG tablet Take 1 tablet (500 mg total) by mouth 2 (two) times daily. 09/11/15   Hyman Bible, PA-C     Family History History reviewed. No pertinent family history.  Social History Social History  Substance Use Topics  . Smoking status: Never Smoker  . Smokeless tobacco: Never Used  . Alcohol use No     Allergies   Patient has no known allergies.   Review of Systems Review of Systems  Constitutional: Negative for chills and fever.  HENT: Negative for ear pain and sore throat.   Eyes: Negative for pain and visual disturbance.  Respiratory: Negative for cough and shortness of breath.   Cardiovascular: Negative for chest pain and palpitations.  Gastrointestinal: Positive for abdominal pain, anal bleeding, blood in stool, constipation and rectal pain. Negative for abdominal distention, diarrhea, nausea and vomiting.  Genitourinary: Positive for menstrual problem. Negative for decreased urine volume, difficulty urinating, dysuria, flank pain, frequency, hematuria, vaginal bleeding, vaginal discharge and vaginal pain.  Musculoskeletal: Negative for arthralgias and back pain.  Skin: Negative for color change and rash.  Neurological: Negative for seizures, syncope and headaches.  All other systems reviewed and are negative.    Physical Exam Updated Vital Signs BP 117/66 (BP Location: Right Arm)   Pulse 83   Temp 97.8 F (36.6 C)   Resp 16   Ht 5\' 4"  (1.626 m)   Wt 90.7 kg (200 lb)   LMP 11/22/2016   SpO2 99%   BMI 34.33 kg/m   Physical Exam  Constitutional: She appears well-developed and well-nourished. No distress.  HENT:  Head: Normocephalic and atraumatic.  Mouth/Throat: Oropharynx is clear and moist. No oropharyngeal exudate.  Eyes: Conjunctivae are normal. Right eye exhibits no discharge. Left eye exhibits no discharge. No scleral icterus.  Neck: Normal range of motion.  Cardiovascular: Normal rate, regular rhythm, normal heart sounds and intact distal pulses.  Exam reveals no friction rub.   No murmur heard. Pulmonary/Chest: Effort normal and breath sounds  normal. No stridor. No respiratory distress. She has no wheezes.  Abdominal: Soft. Bowel sounds are normal. She exhibits no distension and no mass. There is no tenderness. There is no guarding.  Genitourinary: Rectal exam shows guaiac positive stool. Rectal exam shows no external hemorrhoid, no fissure, no mass, no tenderness and anal tone normal. There is no tenderness on the right labia. There is no tenderness on the left labia. Cervix exhibits no motion tenderness. Right adnexum displays no mass, no tenderness and no fullness. Left adnexum displays no mass, no tenderness and no fullness. No tenderness or bleeding in the vagina. No foreign body in the vagina. Vaginal discharge found.  Musculoskeletal: She exhibits no edema or deformity.  Neurological: She is alert. She exhibits normal muscle tone.  Skin: Skin is warm and dry. No rash noted. She is not diaphoretic.  Psychiatric: She has a normal mood and affect. Her behavior is normal.  Nursing note and vitals reviewed.    ED Treatments / Results  Labs (all labs ordered are listed, but only abnormal results are displayed) Labs Reviewed  WET PREP, GENITAL - Abnormal; Notable for the following:       Result Value   WBC, Wet Prep HPF POC MODERATE (*)    All other components within normal limits  URINALYSIS, ROUTINE W REFLEX MICROSCOPIC - Abnormal; Notable for the following:    APPearance CLOUDY (*)    Hgb urine dipstick SMALL (*)    Ketones, ur 15 (*)    Nitrite POSITIVE (*)    Leukocytes, UA SMALL (*)    All other components within normal limits  CBC WITH DIFFERENTIAL/PLATELET - Abnormal; Notable for the following:    Hemoglobin 9.6 (*)    HCT 28.9 (*)    MCV 74.5 (*)    MCH 24.7 (*)    RDW 16.1 (*)    All other components within normal limits  COMPREHENSIVE METABOLIC PANEL - Abnormal; Notable for the following:    Potassium 3.1 (*)    Albumin 3.2 (*)    ALT 11 (*)    All other components within normal limits  OCCULT BLOOD X 1  CARD TO LAB, STOOL - Abnormal; Notable for the following:    Fecal Occult Bld POSITIVE (*)    All other components within normal limits  URINALYSIS, MICROSCOPIC (REFLEX) - Abnormal; Notable for the following:    Bacteria, UA MANY (*)    Squamous Epithelial / LPF 6-30 (*)    All other components within normal limits  URINE CULTURE  LIPASE, BLOOD  HCG, SERUM, QUALITATIVE  GC/CHLAMYDIA PROBE AMP ([place]) NOT AT [facility]    EKG  EKG Interpretation None       Radiology No results found.  Procedures Procedures (including critical care time)  Medications Ordered in ED Medications  sodium chloride 0.9 % bolus 1,000 mL (0 mLs Intravenous Stopped 12/05/16 1404)  cefTRIAXone (ROCEPHIN) injection 250 mg (250 mg Intramuscular Given 12/05/16 1556)  azithromycin (ZITHROMAX) powder 1 g (1 g Oral Given 12/05/16 1555)     Initial Impression / Assessment and Plan / ED  Course  I have reviewed the triage vital signs and the nursing notes.  Pertinent labs & imaging results that were available during my care of the patient were reviewed by me and considered in my medical decision making (see chart for details).    Freeman Caldron presents with symptoms consistent with constipation. This is supported by her recently stopping her MiraLAX.  Patient is nontoxic, nonseptic appearing, in no apparent distress.  Patient's pain and other symptoms adequately managed in emergency department.  Fluid bolus given.  Labs, and vitals reviewed.  Patient does not meet the SIRS or Sepsis criteria.  On repeat exam patient does not have a surgical abdomin and there are no peritoneal signs.  No indication of appendicitis, bowel obstruction, bowel perforation, cholecystitis, diverticulitis, PID or ectopic pregnancy.  Due to abdominal pain without tenderness on exam, patient's history, and physical exam at this point I do not feel that CT imaging is warranted, patient agrees and understands that that limits my ability to fully  evaluate her abdominal pain.    Patient was also instructed to restart her MiraLAX, take a stool softener, and eat a high fiber diet. Due to her positive Hemoccult and GI symptoms patient instructed to follow up with gastroenterology. Patient was also noted to be anemic, she has been told to start taking iron pills and to follow up with her OB/GYN regarding her vaginal spotting/bleeding which she attributes to her nexplanon.  Patient was advised to begin taking iron supplements to help with her anemia. Unsure if her anemia is caused by iron deficiency, vaginal bleeding,  rectal bleeding, or some combination. Is possible that patient has a bleeding internal hemorrhoid however none was palpated on rectal exam. Based on physical exam suspect combination of iron deficiency and vaginal spotting/bleeding.   Patient instructed to follow up with her PCP regarding her anemia.   I have also discussed reasons to return immediately to the ER.  Patient expresses understanding and agrees with plan. Patient was given the option to ask questions, all of which were answered to the best of my ability.   Final Clinical Impressions(s) / ED Diagnoses   Final diagnoses:  Anemia, unspecified type  Lower abdominal pain  Constipation, unspecified constipation type  Pelvic pain in female    New Prescriptions Discharge Medication List as of 12/05/2016  3:55 PM       Lorin Glass, PA-C 12/05/16 Jolayne Panther, MD 12/08/16 2258

## 2016-12-05 NOTE — ED Triage Notes (Signed)
Pt reports bilateral lower abd pain for one week, emesis (last episode was Tuesday), constipation, daughter with AGE recently.

## 2016-12-05 NOTE — Discharge Instructions (Signed)
Please take Ibuprofen (Advil, motrin) and Tylenol (acetaminophen) to relieve your pain.  You may take up to 800 MG (4 pills) of normal strength ibuprofen every 8 hours as needed.  In between doses of ibuprofen you make take tylenol, up to 1,000 mg (two extra strength pills).  Do not take more than 3,000 mg tylenol in a 24 hour period.  Please check all medication labels as many medications such as pain and cold medications may contain tylenol.  Do not drink while taking these medications.  Do not take other NSAID'S while taking ibuprofen (such as aleve or naproxen).  Please start taking iron pills, a stool softener, and miralax.

## 2016-12-05 NOTE — ED Notes (Signed)
Pt still unable to void

## 2016-12-06 LAB — GC/CHLAMYDIA PROBE AMP (~~LOC~~) NOT AT ARMC
Chlamydia: NEGATIVE
Neisseria Gonorrhea: NEGATIVE

## 2016-12-08 LAB — URINE CULTURE: Culture: >=100000 — AB

## 2016-12-09 ENCOUNTER — Telehealth: Payer: Self-pay | Admitting: *Deleted

## 2016-12-09 NOTE — Progress Notes (Addendum)
ED Antimicrobial Stewardship Positive Culture Follow Up   Amber Silva is an 26 y.o. female who presented to [facility] on 12/05/2016 with a chief complaint of  Chief Complaint  Patient presents with  . Abdominal Pain    Recent Results (from the past 720 hour(s))  Urine culture     Status: Abnormal   Collection Time: 12/05/16 11:09 AM  Result Value Ref Range Status   Specimen Description URINE, CLEAN CATCH  Final   Special Requests NONE  Final   Culture >=100,000 COLONIES/mL KLEBSIELLA PNEUMONIAE (A)  Final   Report Status 12/08/2016 FINAL  Final   Organism ID, Bacteria KLEBSIELLA PNEUMONIAE (A)  Final      Susceptibility   Klebsiella pneumoniae - MIC*    AMPICILLIN >=32 RESISTANT Resistant     CEFAZOLIN <=4 SENSITIVE Sensitive     CEFTRIAXONE <=1 SENSITIVE Sensitive     CIPROFLOXACIN <=0.25 SENSITIVE Sensitive     GENTAMICIN <=1 SENSITIVE Sensitive     IMIPENEM <=0.25 SENSITIVE Sensitive     NITROFURANTOIN 64 INTERMEDIATE Intermediate     TRIMETH/SULFA >=320 RESISTANT Resistant     AMPICILLIN/SULBACTAM >=32 RESISTANT Resistant     PIP/TAZO 64 INTERMEDIATE Intermediate     Extended ESBL NEGATIVE Sensitive     * >=100,000 COLONIES/mL KLEBSIELLA PNEUMONIAE  Wet prep, genital     Status: Abnormal   Collection Time: 12/05/16  2:55 PM  Result Value Ref Range Status   Yeast Wet Prep HPF POC NONE SEEN NONE SEEN Final   Trich, Wet Prep NONE SEEN NONE SEEN Final   Clue Cells Wet Prep HPF POC NONE SEEN NONE SEEN Final   WBC, Wet Prep HPF POC MODERATE (A) NONE SEEN Final   Sperm NONE SEEN  Final   Keflex 500 mg q6 x 1 week  ED Provider: Quintella Reichert, MD  Wynell Balloon 12/09/2016, 8:25 AM Infectious Diseases Pharmacist Phone# [phone]

## 2016-12-09 NOTE — Telephone Encounter (Signed)
Post ED Visit - Positive Culture Follow-up: Successful Patient Follow-Up  Culture assessed and recommendations reviewed by: []  Elenor Quinones, Pharm.D. []  Heide Guile, Pharm.D., BCPS AQ-ID [x]  Parks Neptune, Pharm.D., BCPS []  Alycia Rossetti, Pharm.D., BCPS []  [place], Pharm.D., BCPS, AAHIVP []  Legrand Como, Pharm.D., BCPS, AAHIVP []  Salome Arnt, PharmD, BCPS []  Dimitri Ped, PharmD, BCPS []  Vincenza Hews, PharmD, BCPS  Positive urine culture  [x]  Patient discharged without antimicrobial prescription and treatment is now indicated [x]  Organism is resistant to prescribed ED discharge antimicrobial []  Patient with positive blood cultures  Changes discussed with ED provider: Quintella Reichert, MD New antibiotic prescription Keflex 500mg  PO QID x 7 days Called to Doreene Nest [facility] [phone]  Contacted patient, date 12/10/2015, time [place], [place] 12/09/2016, 10:19 AM

## 2017-01-03 ENCOUNTER — Emergency Department (HOSPITAL_BASED_OUTPATIENT_CLINIC_OR_DEPARTMENT_OTHER)
Admission: EM | Admit: 2017-01-03 | Discharge: 2017-01-04 | Disposition: A | Discharge status: Home / Self Care | Payer: Medicaid Other | Attending: Emergency Medicine | Admitting: Emergency Medicine

## 2017-01-03 ENCOUNTER — Encounter (HOSPITAL_BASED_OUTPATIENT_CLINIC_OR_DEPARTMENT_OTHER): Payer: Self-pay | Admitting: *Deleted

## 2017-01-03 ENCOUNTER — Emergency Department (HOSPITAL_BASED_OUTPATIENT_CLINIC_OR_DEPARTMENT_OTHER): Payer: Medicaid Other

## 2017-01-03 DIAGNOSIS — Z79899 Other long term (current) drug therapy: Secondary | ICD-10-CM | POA: Insufficient documentation

## 2017-01-03 DIAGNOSIS — K599 Functional intestinal disorder, unspecified: Secondary | ICD-10-CM | POA: Insufficient documentation

## 2017-01-03 DIAGNOSIS — K5909 Other constipation: Secondary | ICD-10-CM

## 2017-01-03 DIAGNOSIS — K5641 Fecal impaction: Secondary | ICD-10-CM

## 2017-01-03 LAB — PREGNANCY, URINE: Preg Test, Ur: NEGATIVE

## 2017-01-03 NOTE — ED Notes (Signed)
C/o constipation and abd pain x 4 weeks  States no otc meds are working

## 2017-01-03 NOTE — ED Triage Notes (Signed)
Constipation. She was seen here 4 weeks ago for the same and was told to take Miralax. No BM since last visit. States she is having bright red rectal bleeding when she tries to have a BM.

## 2017-01-04 NOTE — ED Notes (Signed)
Pt on bedside commode attempting BM at this time. No further assistance needed at this time.

## 2017-01-04 NOTE — ED Provider Notes (Signed)
[facility] Provider Note: Amber Spurling, MD, FACEP  CSN: [id number] MRN: [id number] ARRIVAL: 01/03/17 at 2042 ROOM: Amber Silva  Constipation   Amber Silva  Amber Silva is a 26 y.o. female who was seen a month ago for constipation and was told to take MiraLAX. She states she has been taking MiraLAX daily as well as a stool softener and prunes but insists she has not had a bowel movement since then. She is having mild to moderate diffuse abdominal pain. When she attempts to move her bowels she passes a small amount of blood but no stool.    Past Medical History:  Diagnosis Date  . Bronchitis   . Pregnancy induced hypertension     History reviewed. No pertinent surgical history.  No family history on file.  Social History  Substance Use Topics  . Smoking status: Never Smoker  . Smokeless tobacco: Never Used  . Alcohol use No    Prior to Admission medications   Medication Sig Start Date End Date Taking? Authorizing Provider  methocarbamol (ROBAXIN) 500 MG tablet Take 1 tablet (500 mg total) by mouth 2 (two) times daily. 09/11/15   Hyman Bible, PA-C  naproxen (NAPROSYN) 500 MG tablet Take 1 tablet (500 mg total) by mouth 2 (two) times daily. 09/11/15   Hyman Bible, PA-C    Allergies Patient has no known allergies.   REVIEW OF SYSTEMS  Negative except as noted here or in the History of Present Illness.   PHYSICAL EXAMINATION  Initial Vital Signs Blood pressure (!) 131/57, pulse 75, temperature 98.4 F (36.9 C), temperature source Oral, resp. rate 18, height 5\' 4"  (1.626 m), weight 90.7 kg (200 lb), last menstrual period 12/27/2016, SpO2 100 %.  Examination General: Well-developed, well-nourished female in no acute distress; appearance consistent with age of record HENT: normocephalic; atraumatic Eyes: pupils equal, round and reactive to light; extraocular muscles intact Neck: supple Heart: regular rate and  rhythm Lungs: clear to auscultation bilaterally Abdomen: soft; nondistended; mild diffuse tenderness; no masses or hepatosplenomegaly; bowel sounds present Rectal normal sphincter tone; copious formed stool in rectal vault; blood on examining glove Extremities: No deformity; full range of motion; pulses normal Neurologic: Awake, alert and oriented; motor function intact in all extremities and symmetric; no facial droop Skin: Warm and dry Psychiatric: Normal mood and affect   RESULTS  Summary of this visit's results, reviewed by myself:   EKG Interpretation  Date/Time:    Ventricular Rate:    PR Interval:    QRS Duration:   QT Interval:    QTC Calculation:   R Axis:     Text Interpretation:        Laboratory Studies: Results for orders placed or performed during the hospital encounter of 01/03/17 (from the past 24 hour(s))  Pregnancy, urine     Status: None   Collection Time: 01/03/17  9:30 PM  Result Value Ref Range   Preg Test, Ur NEGATIVE NEGATIVE   Imaging Studies: Dg Abdomen Acute W/chest  Result Date: 01/03/2017 CLINICAL DATA:  Chronic constipation and generalized abdominal distention. Acute onset of right-sided chest pain and wheezing. Initial encounter. EXAM: DG ABDOMEN ACUTE W/ 1V CHEST COMPARISON:  Chest radiograph and CT of the abdomen and pelvis performed 02/11/2014 FINDINGS: The lungs are well-aerated and clear. There is no evidence of focal opacification, pleural effusion or pneumothorax. The cardiomediastinal silhouette is within normal limits. The visualized bowel gas pattern is unremarkable. Scattered stool and air  are seen within the colon; there is no evidence of small bowel dilatation to suggest obstruction. No free intra-abdominal air is identified on the provided upright view. No acute osseous abnormalities are seen; the sacroiliac joints are unremarkable in appearance. IMPRESSION: 1. Unremarkable bowel gas pattern; no free intra-abdominal air seen. Moderate  amount of stool noted in the colon. 2. No acute cardiopulmonary process seen. Electronically Signed   By: Garald Balding M.D.   On: 01/03/2017 22:03    ED COURSE  Nursing notes and initial vitals signs, including pulse oximetry, reviewed.  Vitals:   01/03/17 2046 01/03/17 2049 01/03/17 2332  BP:  (!) 142/94 (!) 131/57  Pulse:  95 75  Resp:  16 18  Temp:  98.4 F (36.9 C)   TempSrc:  Oral   SpO2:  100% 100%  Weight: 90.7 kg (200 lb)    Height: 5\' 4"  (1.626 m)     2:02 AM Large bowel movement and significant relief of discomfort after soapsuds enema. Patient given instruction for one time high-dose MiraLAX treatment to help relieve the stool burden in the right colon.  PROCEDURES    ED DIAGNOSES     ICD-10-CM   1. Chronic constipation K59.09   2. Fecal impaction in rectum ([place]) K56.41        Amber Silva, Amber Reichmann, MD 01/04/17 [phone]

## 2017-01-04 NOTE — ED Notes (Signed)
Pt reports having good results from enema. Pt states she has some abd cramping. MD made aware.

## 2017-02-12 ENCOUNTER — Emergency Department (HOSPITAL_BASED_OUTPATIENT_CLINIC_OR_DEPARTMENT_OTHER)
Admission: EM | Admit: 2017-02-12 | Discharge: 2017-02-12 | Disposition: A | Discharge status: Other Acute Inpatient Hospital | Payer: Medicaid Other | Attending: Emergency Medicine | Admitting: Emergency Medicine

## 2017-02-12 ENCOUNTER — Encounter (HOSPITAL_BASED_OUTPATIENT_CLINIC_OR_DEPARTMENT_OTHER): Payer: Self-pay

## 2017-02-12 DIAGNOSIS — K625 Hemorrhage of anus and rectum: Secondary | ICD-10-CM | POA: Diagnosis not present

## 2017-02-12 DIAGNOSIS — R1032 Left lower quadrant pain: Secondary | ICD-10-CM

## 2017-02-12 DIAGNOSIS — E876 Hypokalemia: Secondary | ICD-10-CM | POA: Diagnosis not present

## 2017-02-12 DIAGNOSIS — D012 Carcinoma in situ of rectum: Secondary | ICD-10-CM | POA: Insufficient documentation

## 2017-02-12 DIAGNOSIS — C20 Malignant neoplasm of rectum: Secondary | ICD-10-CM

## 2017-02-12 DIAGNOSIS — Z85038 Personal history of other malignant neoplasm of large intestine: Secondary | ICD-10-CM | POA: Diagnosis not present

## 2017-02-12 HISTORY — DX: Malignant neoplasm of colon, unspecified: C18.9

## 2017-02-12 LAB — URINALYSIS, ROUTINE W REFLEX MICROSCOPIC
BILIRUBIN URINE: NEGATIVE
Glucose, UA: NEGATIVE mg/dL
Ketones, ur: 15 mg/dL — AB
Leukocytes, UA: NEGATIVE
Nitrite: NEGATIVE
Protein, ur: NEGATIVE mg/dL
Specific Gravity, Urine: 1.013 (ref 1.005–1.030)
pH: 7 (ref 5.0–8.0)

## 2017-02-12 LAB — CBC
HEMATOCRIT: 27.2 % — AB (ref 36.0–46.0)
HEMOGLOBIN: 8.7 g/dL — AB (ref 12.0–15.0)
MCH: 23.8 pg — AB (ref 26.0–34.0)
MCHC: 32 g/dL (ref 30.0–36.0)
MCV: 74.3 fL — AB (ref 78.0–100.0)
PLATELETS: 313 10*3/uL (ref 150–400)
RBC: 3.66 MIL/uL — AB (ref 3.87–5.11)
RDW: 17.4 % — ABNORMAL HIGH (ref 11.5–15.5)
WBC: 4.3 10*3/uL (ref 4.0–10.5)

## 2017-02-12 LAB — PREGNANCY, URINE: Preg Test, Ur: NEGATIVE

## 2017-02-12 LAB — I-STAT CG4 LACTIC ACID, ED: Lactic Acid, Venous: 1.04 mmol/L (ref 0.5–1.9)

## 2017-02-12 LAB — CBC WITH DIFFERENTIAL/PLATELET
Basophils Absolute: 0 10*3/uL (ref 0.0–0.1)
Basophils Relative: 0 %
Eosinophils Absolute: 0 10*3/uL (ref 0.0–0.7)
Eosinophils Relative: 1 %
HEMATOCRIT: 28 % — AB (ref 36.0–46.0)
Hemoglobin: 9.1 g/dL — ABNORMAL LOW (ref 12.0–15.0)
LYMPHS ABS: 1.3 10*3/uL (ref 0.7–4.0)
LYMPHS PCT: 30 %
MCH: 23.8 pg — AB (ref 26.0–34.0)
MCHC: 32.5 g/dL (ref 30.0–36.0)
MCV: 73.3 fL — AB (ref 78.0–100.0)
MONOS PCT: 10 %
Monocytes Absolute: 0.4 10*3/uL (ref 0.1–1.0)
NEUTROS ABS: 2.6 10*3/uL (ref 1.7–7.7)
Neutrophils Relative %: 59 %
Platelets: 359 10*3/uL (ref 150–400)
RBC: 3.82 MIL/uL — AB (ref 3.87–5.11)
RDW: 17.3 % — ABNORMAL HIGH (ref 11.5–15.5)
WBC: 4.3 10*3/uL (ref 4.0–10.5)

## 2017-02-12 LAB — COMPREHENSIVE METABOLIC PANEL
ALK PHOS: 87 U/L (ref 38–126)
ALT: 10 U/L — AB (ref 14–54)
ANION GAP: 10 (ref 5–15)
AST: 16 U/L (ref 15–41)
Albumin: 3.5 g/dL (ref 3.5–5.0)
BUN: 5 mg/dL — AB (ref 6–20)
CO2: 23 mmol/L (ref 22–32)
Calcium: 8.9 mg/dL (ref 8.9–10.3)
Chloride: 105 mmol/L (ref 101–111)
Creatinine, Ser: 0.62 mg/dL (ref 0.44–1.00)
GFR calc non Af Amer: >60 mL/min (ref >60)
Glucose, Bld: 97 mg/dL (ref 65–99)
Potassium: 3 mmol/L — ABNORMAL LOW (ref 3.5–5.1)
Sodium: 138 mmol/L (ref 135–145)
Total Bilirubin: 0.4 mg/dL (ref 0.3–1.2)
Total Protein: 7.2 g/dL (ref 6.5–8.1)

## 2017-02-12 LAB — URINALYSIS, MICROSCOPIC (REFLEX)

## 2017-02-12 LAB — PROTIME-INR
INR: 1.01
Prothrombin Time: 13.3 seconds (ref 11.4–15.2)

## 2017-02-12 LAB — LIPASE, BLOOD: LIPASE: 23 U/L (ref 11–51)

## 2017-02-12 MED ORDER — ONDANSETRON HCL 4 MG/2ML IJ SOLN
4.0000 mg | Freq: Once | INTRAMUSCULAR | Status: AC
  Administered 2017-02-12: 4 mg via INTRAVENOUS
  Filled 2017-02-12: qty 2

## 2017-02-12 MED ORDER — LACTATED RINGERS IV BOLUS (SEPSIS)
1000.0000 mL | Freq: Once | INTRAVENOUS | Status: AC
  Administered 2017-02-12: 1000 mL via INTRAVENOUS

## 2017-02-12 MED ORDER — MORPHINE SULFATE (PF) 4 MG/ML IV SOLN
4.0000 mg | Freq: Once | INTRAVENOUS | Status: AC
  Administered 2017-02-12: 4 mg via INTRAVENOUS
  Filled 2017-02-12: qty 1

## 2017-02-12 MED ORDER — LACTATED RINGERS IV SOLN
INTRAVENOUS | Status: DC
  Administered 2017-02-12: 10:00:00 via INTRAVENOUS

## 2017-02-12 MED ORDER — POTASSIUM CHLORIDE CRYS ER 20 MEQ PO TBCR
40.0000 meq | EXTENDED_RELEASE_TABLET | Freq: Once | ORAL | Status: AC
  Administered 2017-02-12: 40 meq via ORAL
  Filled 2017-02-12: qty 2

## 2017-02-12 NOTE — ED Triage Notes (Signed)
PT reports abdominal pain for "a while" with associated vomiting. States worse today. Pain localized to left abdominal area. States dx with Colon CA on Monday after colonoscopy with biopsy, CT Abd done Friday but patient states she is awaiting the results to know the stage of her Colon CA and the treatment plan.

## 2017-02-12 NOTE — ED Notes (Signed)
PAL line representative at [facility], states patient will have a bed this evening. Updated EDP.

## 2017-02-12 NOTE — ED Provider Notes (Signed)
[facility] DEPT [facility] Provider Note   CSN: [id number] Arrival date & time: 02/12/17  0703     History   Chief Complaint Chief Complaint  Patient presents with  . Abdominal Pain    HPI Amber Silva is a 26 y.o. female.  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 Pt just underwent colonoscopy and evaluation at [facility] had CT yesterday showing invasive ulcerated adenoCA. She is here today because over past 24 hours she has had worsening rectal bleeding, LLQ abdominal pain, and nausea/vomiting. She has not been able to eat today. She has had associated severe pain, which is aching, cramp-like, and comes and goes. She has not had a BM today, but reports passing large clots. She does not know the results of her PET scan. She has felt fatigued and had >80 lb weight loss, but denies any fevers. No chills.     Past Medical History:  Diagnosis Date  . Bronchitis   . Colon cancer ([place])   . Pregnancy induced hypertension     There are no active problems to display for this patient.   History reviewed. No pertinent surgical history.  OB History    No data available       Home Medications    Prior to Admission medications   Not on File    Family History History reviewed. No pertinent family history.  Social History Social History  Substance Use Topics  . Smoking status: Never Smoker  . Smokeless tobacco: Never Used  . Alcohol use No     Allergies   Patient has no known allergies.   Review of Systems Review of Systems  Constitutional: Positive for appetite change, fatigue and unexpected weight change.  Gastrointestinal: Positive for abdominal pain, blood in stool, nausea, rectal pain and vomiting.  Neurological: Positive for weakness.  All other systems reviewed and are negative.    Physical Exam Updated Vital Signs BP (!) 131/93 (BP Location: Left Arm)   Pulse 96   Temp 98.3 F (36.8 C) (Oral)    Resp 18   Ht 5\' 4"  (1.626 m)   Wt 71.7 kg (158 lb)   LMP 02/04/2017   SpO2 100%   BMI 27.12 kg/m   Physical Exam  Constitutional: She is oriented to person, place, and time. She appears well-developed and well-nourished. No distress.  HENT:  Head: Normocephalic and atraumatic.  Eyes: Conjunctivae are normal.  Neck: Neck supple.  Cardiovascular: Normal rate, regular rhythm and normal heart sounds.  Exam reveals no friction rub.   No murmur heard. Pulmonary/Chest: Effort normal and breath sounds normal. No respiratory distress. She has no wheezes. She has no rales.  Abdominal: She exhibits no distension. There is tenderness in the left lower quadrant. There is no rigidity, no rebound and no guarding.  Musculoskeletal: She exhibits no edema.  Neurological: She is alert and oriented to person, place, and time. She exhibits normal muscle tone.  Skin: Skin is warm. Capillary refill takes less than 2 seconds.  Psychiatric: She has a normal mood and affect.  Nursing note and vitals reviewed.    ED Treatments / Results  Labs (all labs ordered are listed, but only abnormal results are displayed) Labs Reviewed  CBC WITH DIFFERENTIAL/PLATELET - Abnormal; Notable for the following:       Result Value   RBC 3.82 (*)    Hemoglobin 9.1 (*)    HCT 28.0 (*)    MCV  73.3 (*)    MCH 23.8 (*)    RDW 17.3 (*)    All other components within normal limits  COMPREHENSIVE METABOLIC PANEL - Abnormal; Notable for the following:    Potassium 3.0 (*)    BUN 5 (*)    ALT 10 (*)    All other components within normal limits  LIPASE, BLOOD  PROTIME-INR  URINALYSIS, ROUTINE W REFLEX MICROSCOPIC  I-STAT CG4 LACTIC ACID, ED    EKG  EKG Interpretation None       Radiology No results found.  Procedures Procedures (including critical care time)  Medications Ordered in ED Medications  lactated ringers bolus 1,000 mL (1,000 mLs Intravenous New Bag/Given 02/12/17 0836)  morphine 4 MG/ML  injection 4 mg (4 mg Intravenous Given 02/12/17 0742)  ondansetron (ZOFRAN) injection 4 mg (4 mg Intravenous Given 02/12/17 0742)  potassium chloride SA (K-DUR,KLOR-CON) CR tablet 40 mEq (40 mEq Oral Given 02/12/17 0836)     Initial Impression / Assessment and Plan / ED Course  I have reviewed the triage vital signs and the nursing notes.  Pertinent labs & imaging results that were available during my care of the patient were reviewed by me and considered in my medical decision making (see chart for details).     26 yo F with recently diagnosed rectal CA here with nausea, vomiting, abdominal pain. 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 On exam, pt in NAD. Abdomen soft. Lab work shows Hgb 9.1, K 3.0. LA normal. Potassium replaced, will consult pt's GI physician re: possible need for admission for sx control, monitoring of Hgb, and surg/onc evaluation.  Discussed with Dr. Shana Chute of [facility], who recommends likely admission to [facility] for surg/onc evaluation and management of sx, rectal bleeding. Discussed with pt who is in agreement. Consult placed to [facility] Oncology team.  Discussed with Dr. Jodelle Green of [facility], who has accepted pt to inpt bed. Pt made aware. Will transfer. Pt in otherwise stable condition, HDS.  Final Clinical Impressions(s) / ED Diagnoses   Final diagnoses:  Rectal bleeding  Left lower quadrant pain  Rectal carcinoma ([place])  Hypokalemia      Duffy Bruce, MD 02/12/17 [phone]

## 2017-02-12 NOTE — ED Notes (Signed)
PALS line called at [facility] per patient requesting status update - PALS line associate states they are waiting on patients to be discharged home so that they may clean a bed for her. Patient and physician updated. Pt requesting to eat, physician states patient may eat. Pt given grahams, applesauce, gatorade, sprite, italian ice and states her mother will bring her food of her choice.

## 2017-03-11 ENCOUNTER — Telehealth: Payer: Self-pay | Admitting: *Deleted

## 2017-03-11 NOTE — Telephone Encounter (Signed)
Received request for Medical records from [place], forwarded to [place] for email/scan/SLS

## 2017-03-13 ENCOUNTER — Encounter (HOSPITAL_BASED_OUTPATIENT_CLINIC_OR_DEPARTMENT_OTHER): Payer: Self-pay | Admitting: Emergency Medicine

## 2017-03-13 ENCOUNTER — Emergency Department (HOSPITAL_BASED_OUTPATIENT_CLINIC_OR_DEPARTMENT_OTHER)
Admission: EM | Admit: 2017-03-13 | Discharge: 2017-03-14 | Disposition: A | Discharge status: Other Acute Inpatient Hospital | Payer: Medicaid Other | Attending: Emergency Medicine | Admitting: Emergency Medicine

## 2017-03-13 ENCOUNTER — Emergency Department (HOSPITAL_BASED_OUTPATIENT_CLINIC_OR_DEPARTMENT_OTHER): Payer: Medicaid Other

## 2017-03-13 DIAGNOSIS — R109 Unspecified abdominal pain: Secondary | ICD-10-CM | POA: Diagnosis present

## 2017-03-13 DIAGNOSIS — R0789 Other chest pain: Secondary | ICD-10-CM

## 2017-03-13 DIAGNOSIS — C787 Secondary malignant neoplasm of liver and intrahepatic bile duct: Secondary | ICD-10-CM | POA: Diagnosis not present

## 2017-03-13 DIAGNOSIS — R6510 Systemic inflammatory response syndrome (SIRS) of non-infectious origin without acute organ dysfunction: Secondary | ICD-10-CM | POA: Diagnosis not present

## 2017-03-13 DIAGNOSIS — Z79899 Other long term (current) drug therapy: Secondary | ICD-10-CM | POA: Diagnosis not present

## 2017-03-13 DIAGNOSIS — C20 Malignant neoplasm of rectum: Secondary | ICD-10-CM | POA: Diagnosis not present

## 2017-03-13 DIAGNOSIS — Z95828 Presence of other vascular implants and grafts: Secondary | ICD-10-CM

## 2017-03-13 DIAGNOSIS — Z933 Colostomy status: Secondary | ICD-10-CM | POA: Insufficient documentation

## 2017-03-13 DIAGNOSIS — R079 Chest pain, unspecified: Secondary | ICD-10-CM | POA: Diagnosis not present

## 2017-03-13 LAB — CBC WITH DIFFERENTIAL/PLATELET
BASOS PCT: 0 %
Basophils Absolute: 0 10*3/uL (ref 0.0–0.1)
Eosinophils Absolute: 0 10*3/uL (ref 0.0–0.7)
Eosinophils Relative: 0 %
HCT: 30 % — ABNORMAL LOW (ref 36.0–46.0)
Hemoglobin: 9.8 g/dL — ABNORMAL LOW (ref 12.0–15.0)
Lymphocytes Relative: 8 %
Lymphs Abs: 1.2 10*3/uL (ref 0.7–4.0)
MCH: 24.7 pg — AB (ref 26.0–34.0)
MCHC: 32.7 g/dL (ref 30.0–36.0)
MCV: 75.8 fL — ABNORMAL LOW (ref 78.0–100.0)
MONO ABS: 0.1 10*3/uL (ref 0.1–1.0)
Monocytes Relative: 1 %
NEUTROS PCT: 91 %
Neutro Abs: 13.1 10*3/uL — ABNORMAL HIGH (ref 1.7–7.7)
PLATELETS: 261 10*3/uL (ref 150–400)
RBC: 3.96 MIL/uL (ref 3.87–5.11)
RDW: 16.7 % — AB (ref 11.5–15.5)
WBC: 14.4 10*3/uL — AB (ref 4.0–10.5)

## 2017-03-13 LAB — I-STAT CG4 LACTIC ACID, ED: LACTIC ACID, VENOUS: 1.47 mmol/L (ref 0.5–1.9)

## 2017-03-13 LAB — COMPREHENSIVE METABOLIC PANEL
ALT: 10 U/L — ABNORMAL LOW (ref 14–54)
ANION GAP: 6 (ref 5–15)
AST: 14 U/L — ABNORMAL LOW (ref 15–41)
Albumin: 2.9 g/dL — ABNORMAL LOW (ref 3.5–5.0)
Alkaline Phosphatase: 83 U/L (ref 38–126)
BUN: 9 mg/dL (ref 6–20)
CHLORIDE: 104 mmol/L (ref 101–111)
CO2: 24 mmol/L (ref 22–32)
Calcium: 8.3 mg/dL — ABNORMAL LOW (ref 8.9–10.3)
Creatinine, Ser: 0.66 mg/dL (ref 0.44–1.00)
GFR calc Af Amer: >60 mL/min (ref >60)
Glucose, Bld: 93 mg/dL (ref 65–99)
POTASSIUM: 3.4 mmol/L — AB (ref 3.5–5.1)
Sodium: 134 mmol/L — ABNORMAL LOW (ref 135–145)
TOTAL PROTEIN: 6.7 g/dL (ref 6.5–8.1)
Total Bilirubin: 0.1 mg/dL — ABNORMAL LOW (ref 0.3–1.2)

## 2017-03-13 LAB — URINALYSIS, ROUTINE W REFLEX MICROSCOPIC
BILIRUBIN URINE: NEGATIVE
GLUCOSE, UA: NEGATIVE mg/dL
HGB URINE DIPSTICK: NEGATIVE
KETONES UR: NEGATIVE mg/dL
LEUKOCYTES UA: NEGATIVE
Nitrite: NEGATIVE
PROTEIN: NEGATIVE mg/dL
Specific Gravity, Urine: <1.005 — ABNORMAL LOW (ref 1.005–1.030)
pH: 6.5 (ref 5.0–8.0)

## 2017-03-13 LAB — LIPASE, BLOOD: LIPASE: 24 U/L (ref 11–51)

## 2017-03-13 MED ORDER — SODIUM CHLORIDE 0.9 % IV BOLUS (SEPSIS)
1000.0000 mL | Freq: Once | INTRAVENOUS | Status: AC
  Administered 2017-03-13: 1000 mL via INTRAVENOUS

## 2017-03-13 MED ORDER — IOPAMIDOL (ISOVUE-300) INJECTION 61%
100.0000 mL | Freq: Once | INTRAVENOUS | Status: AC | PRN
  Administered 2017-03-13: 100 mL via INTRAVENOUS

## 2017-03-13 MED ORDER — ONDANSETRON HCL 4 MG/2ML IJ SOLN
4.0000 mg | Freq: Once | INTRAMUSCULAR | Status: AC
  Administered 2017-03-13: 4 mg via INTRAVENOUS
  Filled 2017-03-13: qty 2

## 2017-03-13 MED ORDER — VANCOMYCIN HCL IN DEXTROSE 1-5 GM/200ML-% IV SOLN
1000.0000 mg | Freq: Once | INTRAVENOUS | Status: AC
  Administered 2017-03-13: 1000 mg via INTRAVENOUS
  Filled 2017-03-13: qty 200

## 2017-03-13 MED ORDER — HYDROMORPHONE HCL 1 MG/ML IJ SOLN
1.0000 mg | Freq: Once | INTRAMUSCULAR | Status: AC
  Administered 2017-03-13: 1 mg via INTRAVENOUS
  Filled 2017-03-13: qty 1

## 2017-03-13 MED ORDER — DEXTROSE 5 % IV SOLN
2.0000 g | Freq: Once | INTRAVENOUS | Status: AC
  Administered 2017-03-13: 2 g via INTRAVENOUS

## 2017-03-13 MED ORDER — HYDROMORPHONE HCL 1 MG/ML IJ SOLN
1.0000 mg | INTRAMUSCULAR | Status: DC | PRN
  Administered 2017-03-13 (×2): 1 mg via INTRAVENOUS
  Filled 2017-03-13 (×2): qty 1

## 2017-03-13 MED ORDER — SODIUM CHLORIDE 0.9 % IV SOLN
1000.0000 mL | INTRAVENOUS | Status: DC
  Administered 2017-03-13: 1000 mL via INTRAVENOUS

## 2017-03-13 MED ORDER — CEFEPIME HCL 2 G IJ SOLR
INTRAMUSCULAR | Status: AC
  Filled 2017-03-13: qty 2

## 2017-03-13 NOTE — ED Notes (Signed)
Patient stated that she had a colon cancer and her first chemo treatment was the past Wednesday.  She has a colostomy to her LLQ and the last time she had an output was last Wednesday and the stool is soft and watery and "looks-like a soft-formed stool and yesterday I had 2 little hard stool ," per patient.

## 2017-03-13 NOTE — ED Notes (Signed)
ED Provider at bedside. 

## 2017-03-13 NOTE — ED Provider Notes (Signed)
[place] DEPT [facility] Provider Note   CSN: [id number] Arrival date & time: 03/13/17  1602     History   Chief Complaint Chief Complaint  Patient presents with  . Abdominal Pain    HPI Amber Silva is a 26 y.o. female.  HPI Patient has history of recently diagnosed invasive rectal adenocarcinoma. Patient is undergoing treatment at [facility]. Patient reports that she has developed worsening pain in her abdomen. She reports it has been worse over the past 1-1/2 days. She reports she's had decreased output from her colostomy. She states she has only had 2 very small firm stool balls to pass. As well, patient has had chest pain. She reports as a burning and diffuse quality on the right. She has a port that was placed recently. She has not had fever. Patient did contact her oncologist and was advised to come to the emergency department for further evaluation. Past Medical History:  Diagnosis Date  . Bronchitis   . Colon cancer ([place])   . Pregnancy induced hypertension     There are no active problems to display for this patient.   Past Surgical History:  Procedure Laterality Date  . COLON SURGERY      OB History    No data available       Home Medications    Prior to Admission medications   Medication Sig Start Date End Date Taking? Authorizing Provider  oxycodone (OXY-IR) 5 MG capsule Take 5 mg by mouth every 4 (four) hours as needed.   Yes [provider]  potassium chloride (K-DUR) 10 MEQ tablet Take 10 mEq by mouth daily.   Yes [provider]    Family History History reviewed. No pertinent family history.  Social History Social History  Substance Use Topics  . Smoking status: Never Smoker  . Smokeless tobacco: Never Used  . Alcohol use No     Allergies   Patient has no known allergies.   Review of Systems Review of Systems  10 Systems reviewed and are negative for acute change except as noted in the HPI.  Physical  Exam Updated Vital Signs BP 125/89   Pulse (!) 104   Temp 98.4 F (36.9 C) (Oral)   Resp 18   Ht 5\' 4"  (1.626 m)   Wt 71.7 kg (158 lb)   LMP 03/09/2017   SpO2 100%   BMI 27.12 kg/m   Physical Exam  Constitutional: She is oriented to person, place, and time.  Patient is alert and appropriate. No confusion. Tachypnea but no respiratory distress. Patient appears to be in significant pain. She is, however calm.  HENT:  Head: Normocephalic and atraumatic.  Mouth/Throat: Oropharynx is clear and moist.  Eyes: EOM are normal.  Neck: Neck supple.  Cardiovascular:  Tachycardia. No gross rub murmur gallop.  Pulmonary/Chest:  Tachypnea. Grossly clear lung sounds bilaterally. No gross wheezes rhonchi or rail. Patient has a recently placed subcutaneous port on the right anterior chest. Incision is clean dry and intact. No erythema of the chest wall.  Abdominal: Soft.  Patient has colostomy bag is empty in the left mid quadrant. Abdomen is soft. Patient does endorse discomfort diffusely to palpation. No guarding.  Musculoskeletal: Normal range of motion. She exhibits no edema or tenderness.  Soft and nontender. No peripheral edema.  Neurological: She is alert and oriented to person, place, and time. No cranial nerve deficit. She exhibits normal muscle tone. Coordination normal.  Skin: Skin is warm and dry.  Psychiatric:  She has a normal mood and affect.     ED Treatments / Results  Labs (all labs ordered are listed, but only abnormal results are displayed) Labs Reviewed  CBC WITH DIFFERENTIAL/PLATELET - Abnormal; Notable for the following:       Result Value   WBC 14.4 (*)    Hemoglobin 9.8 (*)    HCT 30.0 (*)    MCV 75.8 (*)    MCH 24.7 (*)    RDW 16.7 (*)    Neutro Abs 13.1 (*)    All other components within normal limits  URINALYSIS, ROUTINE W REFLEX MICROSCOPIC - Abnormal; Notable for the following:    Specific Gravity, Urine <1.005 (*)    All other components within normal  limits  COMPREHENSIVE METABOLIC PANEL - Abnormal; Notable for the following:    Sodium 134 (*)    Potassium 3.4 (*)    Calcium 8.3 (*)    Albumin 2.9 (*)    AST 14 (*)    ALT 10 (*)    Total Bilirubin 0.1 (*)    All other components within normal limits  CULTURE, BLOOD (ROUTINE X 2)  CULTURE, BLOOD (ROUTINE X 2)  LIPASE, BLOOD  I-STAT CG4 LACTIC ACID, ED    EKG  EKG Interpretation None       Radiology Ct Chest W Contrast  Result Date: 03/13/2017 CLINICAL DATA:  25 year old female recently diagnosed with rectal cancer status post colostomy in August 2018, presenting with left lower quadrant abdominal pain and nausea. Recent initiation of chemotherapy September 2018. EXAM: CT CHEST, ABDOMEN, AND PELVIS WITH CONTRAST TECHNIQUE: Multidetector CT imaging of the chest, abdomen and pelvis was performed following the standard protocol during bolus administration of intravenous contrast. CONTRAST:  153mL ISOVUE-300 IOPAMIDOL (ISOVUE-300) INJECTION 61% COMPARISON:  CT of the chest, abdomen and pelvis 02/11/2017. FINDINGS: CT CHEST FINDINGS Cardiovascular: Heart size is normal. There is no significant pericardial fluid, thickening or pericardial calcification. No atherosclerotic disease noted in the thoracic aorta. No coronary artery calcifications. Right internal jugular double-lumen porta cath with tip terminating in the right atrium. Mediastinum/Nodes: No pathologically enlarged mediastinal or hilar lymph nodes. Esophagus is unremarkable in appearance. No axillary lymphadenopathy. Lungs/Pleura: No suspicious-appearing pulmonary nodules or masses. No acute consolidative airspace disease. No pleural effusions. Musculoskeletal: There are no aggressive appearing lytic or blastic lesions noted in the visualized portions of the skeleton. CT ABDOMEN PELVIS FINDINGS Hepatobiliary: Previously noted hypovascular lesions in the liver appear slightly larger than the prior study, measuring 3.1 x 3.7 cm between  segments 7 and 8 (axial image 39 of series 2) and 2.4 x 3.0 cm in segment 4A (axial image 38 of series 2). No new hepatic lesions are otherwise noted. No intra or extrahepatic biliary ductal dilatation. Gallbladder is normal in appearance. Pancreas: No pancreatic mass. No pancreatic ductal dilatation. No pancreatic or peripancreatic fluid or inflammatory changes. Spleen: Unremarkable. Adrenals/Urinary Tract: Left kidney and bilateral adrenal glands are normal in appearance. Mild right-sided hydroureteronephrosis. This appears likely related to extrinsic compression of the distal right ureter related to the large rectal mass (discussed below). Urinary bladder is unremarkable in appearance. Stomach/Bowel: The appearance of the stomach is normal. There is no pathologic dilatation of small bowel or colon. Postoperative changes of colostomy are now noted in the left lower quadrant. Large distal rectal neoplasm again noted, similar to slightly increased in size compared to the prior study measuring up to 8.7 x 8.0 cm (axial image 103 of series 2). Complete obscuration of normal  mesorectal fat planes indicative of diffuse infiltration from neoplasm again noted. Presacral soft tissues also appear diffusely infiltrated. Normal appendix. Vascular/Lymphatic: No significant atherosclerotic disease, aneurysm or dissection noted in the abdominal or pelvic vasculature. Lymph nodes along the pelvic sidewall bilaterally in the internal iliac distribution are mildly enlarged measuring up to 12 mm in short axis on the right, similar to the prior examination. No other lymphadenopathy confidently identified elsewhere in the abdomen. Reproductive: Uterus and ovaries are grossly unremarkable in appearance. The large rectal mass is very intimately associated with the posterior wall the vagina such that direct infiltration of the posterior wall of the vagina is strongly suspected. Other: No significant volume of ascites.  No  pneumoperitoneum. Musculoskeletal: There are no aggressive appearing lytic or blastic lesions noted in the visualized portions of the skeleton. IMPRESSION: 1. Findings suggest slight progression of disease with slight enlargement of previously demonstrated large rectal mass which demonstrates local invasion into the posterior wall the vagina as well as the perirectal soft tissues and presacral space, persistent pelvic lymphadenopathy and 2 metastatic lesions in the liver. As well, today's study demonstrates new right-sided mild hydroureteronephrosis, presumably from extrinsic compression of the distal third of the right ureter related to the large pelvic mass. 2. No evidence of metastatic disease in the thorax. 3. Status post colostomy. 4. Additional incidental findings, as above. Electronically Signed   By: Vinnie Langton M.D.   On: 03/13/2017 18:03   Ct Abdomen Pelvis W Contrast  Result Date: 03/13/2017 CLINICAL DATA:  26 year old female recently diagnosed with rectal cancer status post colostomy in August 2018, presenting with left lower quadrant abdominal pain and nausea. Recent initiation of chemotherapy September 2018. EXAM: CT CHEST, ABDOMEN, AND PELVIS WITH CONTRAST TECHNIQUE: Multidetector CT imaging of the chest, abdomen and pelvis was performed following the standard protocol during bolus administration of intravenous contrast. CONTRAST:  142mL ISOVUE-300 IOPAMIDOL (ISOVUE-300) INJECTION 61% COMPARISON:  CT of the chest, abdomen and pelvis 02/11/2017. FINDINGS: CT CHEST FINDINGS Cardiovascular: Heart size is normal. There is no significant pericardial fluid, thickening or pericardial calcification. No atherosclerotic disease noted in the thoracic aorta. No coronary artery calcifications. Right internal jugular double-lumen porta cath with tip terminating in the right atrium. Mediastinum/Nodes: No pathologically enlarged mediastinal or hilar lymph nodes. Esophagus is unremarkable in appearance. No  axillary lymphadenopathy. Lungs/Pleura: No suspicious-appearing pulmonary nodules or masses. No acute consolidative airspace disease. No pleural effusions. Musculoskeletal: There are no aggressive appearing lytic or blastic lesions noted in the visualized portions of the skeleton. CT ABDOMEN PELVIS FINDINGS Hepatobiliary: Previously noted hypovascular lesions in the liver appear slightly larger than the prior study, measuring 3.1 x 3.7 cm between segments 7 and 8 (axial image 39 of series 2) and 2.4 x 3.0 cm in segment 4A (axial image 38 of series 2). No new hepatic lesions are otherwise noted. No intra or extrahepatic biliary ductal dilatation. Gallbladder is normal in appearance. Pancreas: No pancreatic mass. No pancreatic ductal dilatation. No pancreatic or peripancreatic fluid or inflammatory changes. Spleen: Unremarkable. Adrenals/Urinary Tract: Left kidney and bilateral adrenal glands are normal in appearance. Mild right-sided hydroureteronephrosis. This appears likely related to extrinsic compression of the distal right ureter related to the large rectal mass (discussed below). Urinary bladder is unremarkable in appearance. Stomach/Bowel: The appearance of the stomach is normal. There is no pathologic dilatation of small bowel or colon. Postoperative changes of colostomy are now noted in the left lower quadrant. Large distal rectal neoplasm again noted, similar to slightly increased in  size compared to the prior study measuring up to 8.7 x 8.0 cm (axial image 103 of series 2). Complete obscuration of normal mesorectal fat planes indicative of diffuse infiltration from neoplasm again noted. Presacral soft tissues also appear diffusely infiltrated. Normal appendix. Vascular/Lymphatic: No significant atherosclerotic disease, aneurysm or dissection noted in the abdominal or pelvic vasculature. Lymph nodes along the pelvic sidewall bilaterally in the internal iliac distribution are mildly enlarged measuring up to  12 mm in short axis on the right, similar to the prior examination. No other lymphadenopathy confidently identified elsewhere in the abdomen. Reproductive: Uterus and ovaries are grossly unremarkable in appearance. The large rectal mass is very intimately associated with the posterior wall the vagina such that direct infiltration of the posterior wall of the vagina is strongly suspected. Other: No significant volume of ascites.  No pneumoperitoneum. Musculoskeletal: There are no aggressive appearing lytic or blastic lesions noted in the visualized portions of the skeleton. IMPRESSION: 1. Findings suggest slight progression of disease with slight enlargement of previously demonstrated large rectal mass which demonstrates local invasion into the posterior wall the vagina as well as the perirectal soft tissues and presacral space, persistent pelvic lymphadenopathy and 2 metastatic lesions in the liver. As well, today's study demonstrates new right-sided mild hydroureteronephrosis, presumably from extrinsic compression of the distal third of the right ureter related to the large pelvic mass. 2. No evidence of metastatic disease in the thorax. 3. Status post colostomy. 4. Additional incidental findings, as above. Electronically Signed   By: Vinnie Langton M.D.   On: 03/13/2017 18:03    Procedures Procedures (including critical care time) CRITICAL CARE Performed by: Charlesetta Shanks   Total critical care time: 45 minutes  Critical care time was exclusive of separately billable procedures and treating other patients.  Critical care was necessary to treat or prevent imminent or life-threatening deterioration.  Critical care was time spent personally by me on the following activities: development of treatment plan with patient and/or surrogate as well as nursing, discussions with consultants, evaluation of patient's response to treatment, examination of patient, obtaining history from patient or surrogate,  ordering and performing treatments and interventions, ordering and review of laboratory studies, ordering and review of radiographic studies, pulse oximetry and re-evaluation of patient's condition. Medications Ordered in ED Medications  HYDROmorphone (DILAUDID) injection 1 mg (1 mg Intravenous Given 03/13/17 2004)  sodium chloride 0.9 % bolus 1,000 mL (0 mLs Intravenous Stopped 03/13/17 2157)    Followed by  0.9 %  sodium chloride infusion (not administered)  ceFEPIme (MAXIPIME) 2 g injection (  Not Given 03/13/17 2000)  sodium chloride 0.9 % bolus 1,000 mL (0 mLs Intravenous Stopped 03/13/17 1730)  HYDROmorphone (DILAUDID) injection 1 mg (1 mg Intravenous Given 03/13/17 1701)  ondansetron (ZOFRAN) injection 4 mg (4 mg Intravenous Given 03/13/17 1702)  iopamidol (ISOVUE-300) 61 % injection 100 mL (100 mLs Intravenous Contrast Given 03/13/17 1723)  ceFEPIme (MAXIPIME) 2 g in dextrose 5 % 50 mL IVPB (0 g Intravenous Stopped 03/13/17 2033)  vancomycin (VANCOCIN) IVPB 1000 mg/200 mL premix (1,000 mg Intravenous New Bag/Given 03/13/17 2004)     Initial Impression / Assessment and Plan / ED Course  I have reviewed the triage vital signs and the nursing notes.  Pertinent labs & imaging results that were available during my care of the patient were reviewed by me and considered in my medical decision making (see chart for details).    Consult: Reviewed with Dr. Baltazar Najjar, oncology at [facility]. Will  except for transport.  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 She does have tachypnea. She has complaints of both abdominal pain and chest pain. A shunt has critical medical illness with metastatic and advanced rectal adenocarcinoma. She has recently had a port placed in the  left anterior chest. At this time, will empirically start antibiotics for possible infection. CT of the abdomen does not show an obstruction however patient does have multiple compressive lesions due to her cancer. Fluids and pain control continued for the patient while in the emergency department. Patient will be transferred to [facility] to be admitted oncology service. New Prescriptions New Prescriptions   No medications on file     Charlesetta Shanks, MD 03/13/17 2219

## 2017-03-13 NOTE — ED Triage Notes (Signed)
Patient states that she is having abdominal pain with constipation  - reports that it is bad up into her chest. She has a colostomy bag that she is says only gets mucous out of

## 2017-03-13 NOTE — ED Notes (Addendum)
7:46 pm --[employer] transfer line - [place]) will page Dr. Latricia Heft [facility]

## 2017-03-19 LAB — CULTURE, BLOOD (ROUTINE X 2)
CULTURE: NO GROWTH
CULTURE: NO GROWTH
SPECIAL REQUESTS: ADEQUATE
SPECIAL REQUESTS: ADEQUATE

## 2017-04-17 ENCOUNTER — Emergency Department (HOSPITAL_BASED_OUTPATIENT_CLINIC_OR_DEPARTMENT_OTHER): Payer: Medicaid Other

## 2017-04-17 ENCOUNTER — Emergency Department (HOSPITAL_BASED_OUTPATIENT_CLINIC_OR_DEPARTMENT_OTHER)
Admission: EM | Admit: 2017-04-17 | Discharge: 2017-04-17 | Disposition: A | Discharge status: Home / Self Care | Payer: Medicaid Other | Attending: Emergency Medicine | Admitting: Emergency Medicine

## 2017-04-17 ENCOUNTER — Encounter (HOSPITAL_BASED_OUTPATIENT_CLINIC_OR_DEPARTMENT_OTHER): Payer: Self-pay | Admitting: Emergency Medicine

## 2017-04-17 DIAGNOSIS — Z79899 Other long term (current) drug therapy: Secondary | ICD-10-CM | POA: Insufficient documentation

## 2017-04-17 DIAGNOSIS — K6289 Other specified diseases of anus and rectum: Secondary | ICD-10-CM | POA: Diagnosis present

## 2017-04-17 DIAGNOSIS — R1084 Generalized abdominal pain: Secondary | ICD-10-CM | POA: Diagnosis not present

## 2017-04-17 LAB — CBC WITH DIFFERENTIAL/PLATELET
Basophils Absolute: 0 10*3/uL (ref 0.0–0.1)
Basophils Relative: 0 %
EOS ABS: 0 10*3/uL (ref 0.0–0.7)
Eosinophils Relative: 1 %
HEMATOCRIT: 34 % — AB (ref 36.0–46.0)
HEMOGLOBIN: 11.3 g/dL — AB (ref 12.0–15.0)
LYMPHS ABS: 1 10*3/uL (ref 0.7–4.0)
LYMPHS PCT: 24 %
MCH: 26.7 pg (ref 26.0–34.0)
MCHC: 33.2 g/dL (ref 30.0–36.0)
MCV: 80.4 fL (ref 78.0–100.0)
MONOS PCT: 2 %
Monocytes Absolute: 0.1 10*3/uL (ref 0.1–1.0)
NEUTROS PCT: 73 %
Neutro Abs: 3.1 10*3/uL (ref 1.7–7.7)
Platelets: 228 10*3/uL (ref 150–400)
RBC: 4.23 MIL/uL (ref 3.87–5.11)
RDW: 19.5 % — ABNORMAL HIGH (ref 11.5–15.5)
WBC: 4.3 10*3/uL (ref 4.0–10.5)

## 2017-04-17 LAB — COMPREHENSIVE METABOLIC PANEL
ALK PHOS: 94 U/L (ref 38–126)
ALT: 12 U/L — AB (ref 14–54)
ANION GAP: 7 (ref 5–15)
AST: 17 U/L (ref 15–41)
Albumin: 3.5 g/dL (ref 3.5–5.0)
BILIRUBIN TOTAL: 0.4 mg/dL (ref 0.3–1.2)
BUN: 13 mg/dL (ref 6–20)
CALCIUM: 9.4 mg/dL (ref 8.9–10.3)
CO2: 24 mmol/L (ref 22–32)
CREATININE: 0.59 mg/dL (ref 0.44–1.00)
Chloride: 106 mmol/L (ref 101–111)
Glucose, Bld: 94 mg/dL (ref 65–99)
Potassium: 3.7 mmol/L (ref 3.5–5.1)
SODIUM: 137 mmol/L (ref 135–145)
TOTAL PROTEIN: 7.8 g/dL (ref 6.5–8.1)

## 2017-04-17 LAB — URINALYSIS, ROUTINE W REFLEX MICROSCOPIC
BILIRUBIN URINE: NEGATIVE
Glucose, UA: NEGATIVE mg/dL
Hgb urine dipstick: NEGATIVE
Ketones, ur: NEGATIVE mg/dL
Leukocytes, UA: NEGATIVE
NITRITE: NEGATIVE
PROTEIN: NEGATIVE mg/dL
Specific Gravity, Urine: 1.01 (ref 1.005–1.030)
pH: 7 (ref 5.0–8.0)

## 2017-04-17 LAB — PREGNANCY, URINE: Preg Test, Ur: NEGATIVE

## 2017-04-17 LAB — LIPASE, BLOOD: LIPASE: 25 U/L (ref 11–51)

## 2017-04-17 MED ORDER — IOPAMIDOL (ISOVUE-300) INJECTION 61%
100.0000 mL | Freq: Once | INTRAVENOUS | Status: AC | PRN
  Administered 2017-04-17: 100 mL via INTRAVENOUS

## 2017-04-17 MED ORDER — SODIUM CHLORIDE 0.9 % IV BOLUS (SEPSIS)
1000.0000 mL | Freq: Once | INTRAVENOUS | Status: AC
  Administered 2017-04-17: 1000 mL via INTRAVENOUS

## 2017-04-17 MED ORDER — ONDANSETRON HCL 4 MG/2ML IJ SOLN
4.0000 mg | Freq: Once | INTRAMUSCULAR | Status: AC
  Administered 2017-04-17: 4 mg via INTRAVENOUS
  Filled 2017-04-17: qty 2

## 2017-04-17 MED ORDER — MORPHINE SULFATE (PF) 4 MG/ML IV SOLN
4.0000 mg | Freq: Once | INTRAVENOUS | Status: AC
  Administered 2017-04-17: 4 mg via INTRAVENOUS
  Filled 2017-04-17: qty 1

## 2017-04-17 NOTE — ED Notes (Signed)
Pt unable to void at this moment. Drinking ginger ale.

## 2017-04-17 NOTE — Discharge Instructions (Signed)
Your CT scan is very reassuring. There is a lot of stool around the colostomy and that may be why you are having abdominal pain.   Please call your PCP for follow-up.  Return without fail for worsening symptoms, including fever, intractable vomiting, bloody stools or any other symptoms concerning to you.

## 2017-04-17 NOTE — ED Provider Notes (Signed)
[place] DEPT [facility] Provider Note   CSN: [id number] Arrival date & time: 04/17/17  1150     History   Chief Complaint Chief Complaint  Patient presents with  . Rectal Pain    ostomy bag    HPI Amber Silva is a 26 y.o. female.  The history is provided by the patient.   26 year old female who presents with abdominal pain, nausea and vomiting. 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 She is currently on chemotherapy with FOLFIRINOX, last received on Wednesday. For the past day she has had abdominal pa around her colostomyh one episode of vomiting and persistent nausea. Continues to pass gas through her colostomy but has had some looser stool with mucus. Reports rectal pain, but this is chronic related to rectal mass. Denies fever chills, cough, shortness of breath, chest pain,vaginal bleeding or discharge, dysuria or urinary frequency, rectal bleeding, melena or hematochezia. She does state that these are atypical symptoms for after her chemotherapy  Past Medical History:  Diagnosis Date  . Bronchitis   . Colon cancer ([place])   . Pregnancy induced hypertension     There are no active problems to display for this patient.   Past Surgical History:  Procedure Laterality Date  . COLON SURGERY      OB History    No data available       Home Medications    Prior to Admission medications   Medication Sig Start Date End Date Taking? Authorizing Provider  oxycodone (OXY-IR) 5 MG capsule Take 5 mg by mouth every 4 (four) hours as needed.    [provider]  potassium chloride (K-DUR) 10 MEQ tablet Take 10 mEq by mouth daily.    [provider]    Family History History reviewed. No pertinent family history.  Social History Social History  Substance Use Topics  . Smoking status: Never Smoker  . Smokeless tobacco: Never Used  . Alcohol use No     Allergies   Patient has no known  allergies.   Review of Systems Review of Systems  Constitutional: Negative for fever.  Respiratory: Negative for shortness of breath.   Cardiovascular: Negative for chest pain.  Gastrointestinal: Positive for abdominal pain, nausea and vomiting.  Genitourinary: Negative for dysuria and frequency.  All other systems reviewed and are negative.    Physical Exam Updated Vital Signs BP 114/74 (BP Location: Left Arm)   Pulse 94   Temp 98.7 F (37.1 C) (Oral)   Resp 18   Ht 5\' 3"  (1.6 m)   Wt 64 kg (141 lb)   LMP 04/15/2017   SpO2 100%   BMI 24.98 kg/m   Physical Exam Physical Exam  Nursing note and vitals reviewed. Constitutional: Well developed, well nourished, non-toxic, and in no acute distress Head: Normocephalic and atraumatic.  Mouth/Throat: Oropharynx is clear and moist.  Neck: Normal range of motion. Neck supple.  Cardiovascular: Normal rate and regular rhythm.   Pulmonary/Chest: Effort normal and breath sounds normal.  Abdominal: Soft. Mild distension around colostomy in the RLQ. There is tenderness to palpation throughout. There is no rebound and no guarding.  Musculoskeletal: Normal range of motion.  Neurological: Alert, no facial droop, fluent speech, moves all extremities symmetrically Skin: Skin is warm and dry.  Psychiatric: Cooperative   ED Treatments / Results  Labs (all labs ordered are listed, but only abnormal results are displayed) Labs Reviewed  CBC WITH DIFFERENTIAL/PLATELET -  Abnormal; Notable for the following:       Result Value   Hemoglobin 11.3 (*)    HCT 34.0 (*)    RDW 19.5 (*)    All other components within normal limits  COMPREHENSIVE METABOLIC PANEL - Abnormal; Notable for the following:    ALT 12 (*)    All other components within normal limits  LIPASE, BLOOD  PREGNANCY, URINE  URINALYSIS, ROUTINE W REFLEX MICROSCOPIC    EKG  EKG Interpretation None       Radiology Ct Abdomen Pelvis W Contrast  Result Date:  04/17/2017 CLINICAL DATA:  New diagnosis rectal carcinoma. LEFT colostomy. Large rectal mass. Pain around stoma. EXAM: CT ABDOMEN AND PELVIS WITH CONTRAST TECHNIQUE: Multidetector CT imaging of the abdomen and pelvis was performed using the standard protocol following bolus administration of intravenous contrast. CONTRAST:  149mL ISOVUE-300 IOPAMIDOL (ISOVUE-300) INJECTION 61% COMPARISON:  CT 03/13/2017, 02/11/2017 FINDINGS: Lower chest: Lung bases are clear. Hepatobiliary: 2050 enhancing hepatic metastasis again demonstrated. More superior lesion measures 22 by 23 mm compared with 24 x 29 mm more inferior lesion measures 33 x 24 mm compared with 37 x 30 mm. No new hepatic lesion. Gallbladder normal. Pancreas: Pancreas is normal. No ductal dilatation. No pancreatic inflammation. Spleen: Normal spleen Adrenals/urinary tract: Adrenal glands normal. Kidneys normal. No RIGHT hydronephrosis as seen on comparison exam. Bladder normal. Stomach/Bowel: The stomach, duodenum and small bowel are normal. Moderate volume of stool in the colon. Appendix is normal. LEFT lower quadrant colostomy without complicating features. No peristomal hernia identified. No abscess. The rectum is massively expanded by circumferential mass within the rectal wall rectum mass measures measures 8.1 by 8.9 cm by 11.6 in axial dimension which compares with 8.7 by of 8.0 by 11.5 cm . Visually medially mass appears slightly larger. Mass approaches the posterior wall of the vagina. The uterus is elevated. Vascular/Lymphatic: Abdominal aortic normal caliber. No periportal adenopathy. No pelvic lymphadenopathy. Reproductive: Uterus elevated by rectal mass. Poor definition between the posterior wall vagina and rectal mass. Other: No free fluid or free air. Musculoskeletal: No aggressive osseous lesion. IMPRESSION: 1. LEFT lower quadrant colostomy without complicating features. Moderate volume stool proximal to the colostomy. 2. Large rectal mass measures  similar in size to comparison exam. Visually mass appears slightly larger. 3. Hepatic metastasis measures smaller.  No new hepatic lesions. 4. Resolved RIGHT hydronephrosis. Electronically Signed   By: Suzy Bouchard M.D.   On: 04/17/2017 15:44    Procedures Procedures (including critical care time)  Medications Ordered in ED Medications  morphine 4 MG/ML injection 4 mg (4 mg Intravenous Given 04/17/17 1339)  ondansetron (ZOFRAN) injection 4 mg (4 mg Intravenous Given 04/17/17 1339)  sodium chloride 0.9 % bolus 1,000 mL (0 mLs Intravenous Stopped 04/17/17 1400)  iopamidol (ISOVUE-300) 61 % injection 100 mL (100 mLs Intravenous Contrast Given 04/17/17 1456)     Initial Impression / Assessment and Plan / ED Course  I have reviewed the triage vital signs and the nursing notes.  Pertinent labs & imaging results that were available during my care of the patient were reviewed by me and considered in my medical decision making (see chart for details).     26 year old female with history of stage IV colon cancer status post partial colectomy with diverting loop colostomy who presents with abdominal pain. She is clinically nontoxic and not acutely ill appearing. Her vital signs are within normal limits. Her abdomen is overall soft non-peritoneal, but there is mild distention around her  colostomy with tenderness to palpation.her blood work is reassuring. UA is unremarkable. CT abdomen and pelvis was performed to evaluate for possible obstruction versus infection or other serious intra-abdominal processes. This is visualized and shows no acute intra-abdominal processes. There is a significant amount of stool near the area of her colostomy, likely related to her pain and distention.   The patient appears reasonably screened and/or stabilized for discharge and I doubt any other medical condition or other [facility] requiring further screening, evaluation, or treatment in the ED at this time prior to  discharge.  Strict return and follow-up instructions reviewed. She expressed understanding of all discharge instructions and felt comfortable with the plan of care.   Final Clinical Impressions(s) / ED Diagnoses   Final diagnoses:  Generalized abdominal pain    New Prescriptions New Prescriptions   No medications on file     Forde Dandy, MD 04/17/17 1643

## 2017-04-17 NOTE — ED Triage Notes (Signed)
Patient reports that she is having pain in her ostomy bag, her rectum and she is noticing that she has a lump in her left groin

## 2017-07-06 ENCOUNTER — Emergency Department (HOSPITAL_BASED_OUTPATIENT_CLINIC_OR_DEPARTMENT_OTHER): Payer: Medicaid Other

## 2017-07-06 ENCOUNTER — Other Ambulatory Visit: Payer: Self-pay

## 2017-07-06 ENCOUNTER — Emergency Department (HOSPITAL_BASED_OUTPATIENT_CLINIC_OR_DEPARTMENT_OTHER)
Admission: EM | Admit: 2017-07-06 | Discharge: 2017-07-06 | Disposition: A | Discharge status: Home / Self Care | Payer: Medicaid Other | Attending: Emergency Medicine | Admitting: Emergency Medicine

## 2017-07-06 ENCOUNTER — Encounter (HOSPITAL_BASED_OUTPATIENT_CLINIC_OR_DEPARTMENT_OTHER): Payer: Self-pay | Admitting: Emergency Medicine

## 2017-07-06 DIAGNOSIS — E876 Hypokalemia: Secondary | ICD-10-CM | POA: Diagnosis not present

## 2017-07-06 DIAGNOSIS — R0689 Other abnormalities of breathing: Secondary | ICD-10-CM | POA: Insufficient documentation

## 2017-07-06 DIAGNOSIS — R079 Chest pain, unspecified: Secondary | ICD-10-CM | POA: Insufficient documentation

## 2017-07-06 DIAGNOSIS — R000 Tachycardia, unspecified: Secondary | ICD-10-CM | POA: Diagnosis not present

## 2017-07-06 DIAGNOSIS — C785 Secondary malignant neoplasm of large intestine and rectum: Secondary | ICD-10-CM | POA: Diagnosis not present

## 2017-07-06 DIAGNOSIS — Z79899 Other long term (current) drug therapy: Secondary | ICD-10-CM | POA: Diagnosis not present

## 2017-07-06 LAB — BRAIN NATRIURETIC PEPTIDE: B Natriuretic Peptide: 9.7 pg/mL (ref 0.0–100.0)

## 2017-07-06 LAB — TROPONIN I

## 2017-07-06 LAB — COMPREHENSIVE METABOLIC PANEL
ALT: 10 U/L — ABNORMAL LOW (ref 14–54)
AST: 18 U/L (ref 15–41)
Albumin: 2.7 g/dL — ABNORMAL LOW (ref 3.5–5.0)
Alkaline Phosphatase: 80 U/L (ref 38–126)
Anion gap: 9 (ref 5–15)
BILIRUBIN TOTAL: 0.5 mg/dL (ref 0.3–1.2)
BUN: 6 mg/dL (ref 6–20)
CHLORIDE: 99 mmol/L — AB (ref 101–111)
CO2: 27 mmol/L (ref 22–32)
CREATININE: 0.47 mg/dL (ref 0.44–1.00)
Calcium: 8.6 mg/dL — ABNORMAL LOW (ref 8.9–10.3)
Glucose, Bld: 96 mg/dL (ref 65–99)
POTASSIUM: 2.9 mmol/L — AB (ref 3.5–5.1)
Sodium: 135 mmol/L (ref 135–145)
TOTAL PROTEIN: 7.7 g/dL (ref 6.5–8.1)

## 2017-07-06 LAB — CBC WITH DIFFERENTIAL/PLATELET
BASOS ABS: 0 10*3/uL (ref 0.0–0.1)
Basophils Relative: 0 %
EOS ABS: 0 10*3/uL (ref 0.0–0.7)
EOS PCT: 0 %
HCT: 27 % — ABNORMAL LOW (ref 36.0–46.0)
HEMOGLOBIN: 8.8 g/dL — AB (ref 12.0–15.0)
LYMPHS ABS: 0.4 10*3/uL — AB (ref 0.7–4.0)
LYMPHS PCT: 9 %
MCH: 28.1 pg (ref 26.0–34.0)
MCHC: 32.6 g/dL (ref 30.0–36.0)
MCV: 86.3 fL (ref 78.0–100.0)
Monocytes Absolute: 0.3 10*3/uL (ref 0.1–1.0)
Monocytes Relative: 8 %
NEUTROS PCT: 83 %
Neutro Abs: 3.4 10*3/uL (ref 1.7–7.7)
PLATELETS: 306 10*3/uL (ref 150–400)
RBC: 3.13 MIL/uL — AB (ref 3.87–5.11)
RDW: 13.5 % (ref 11.5–15.5)
WBC: 4.1 10*3/uL (ref 4.0–10.5)

## 2017-07-06 LAB — MAGNESIUM: MAGNESIUM: 1.8 mg/dL (ref 1.7–2.4)

## 2017-07-06 LAB — PREGNANCY, URINE: PREG TEST UR: NEGATIVE

## 2017-07-06 MED ORDER — FENTANYL CITRATE (PF) 100 MCG/2ML IJ SOLN
100.0000 ug | Freq: Once | INTRAMUSCULAR | Status: AC
  Administered 2017-07-06: 100 ug via INTRAVENOUS
  Filled 2017-07-06: qty 2

## 2017-07-06 MED ORDER — POTASSIUM CHLORIDE 20 MEQ/15ML (10%) PO SOLN
ORAL | Status: AC
  Filled 2017-07-06: qty 30

## 2017-07-06 MED ORDER — POTASSIUM CHLORIDE 10 MEQ/100ML IV SOLN
10.0000 meq | INTRAVENOUS | Status: AC
  Administered 2017-07-06 (×2): 10 meq via INTRAVENOUS
  Filled 2017-07-06 (×2): qty 100

## 2017-07-06 MED ORDER — POTASSIUM CHLORIDE 20 MEQ/15ML (10%) PO SOLN
40.0000 meq | Freq: Once | ORAL | Status: AC
  Administered 2017-07-06: 40 meq via ORAL

## 2017-07-06 MED ORDER — MORPHINE SULFATE ER 15 MG PO TBCR
15.0000 mg | EXTENDED_RELEASE_TABLET | Freq: Two times a day (BID) | ORAL | Status: DC
  Filled 2017-07-06: qty 1

## 2017-07-06 MED ORDER — IOPAMIDOL (ISOVUE-370) INJECTION 76%
100.0000 mL | Freq: Once | INTRAVENOUS | Status: AC | PRN
  Administered 2017-07-06: 82 mL via INTRAVENOUS

## 2017-07-06 MED ORDER — SODIUM CHLORIDE 0.9 % IV BOLUS (SEPSIS)
1000.0000 mL | Freq: Once | INTRAVENOUS | Status: AC
  Administered 2017-07-06: 1000 mL via INTRAVENOUS

## 2017-07-06 MED ORDER — POTASSIUM CHLORIDE CRYS ER 20 MEQ PO TBCR: 40.0000 meq | EXTENDED_RELEASE_TABLET | Freq: Once | ORAL | Status: DC

## 2017-07-06 MED ORDER — POTASSIUM CHLORIDE 20 MEQ PO PACK
40.0000 meq | PACK | Freq: Once | ORAL | Status: DC
  Filled 2017-07-06: qty 2

## 2017-07-06 NOTE — ED Notes (Signed)
Lab notified of orders 

## 2017-07-06 NOTE — ED Provider Notes (Signed)
[place] EMERGENCY DEPARTMENT Provider Note   CSN: [id number] Arrival date & time: 07/06/17  1718     History   Chief Complaint Chief Complaint  Patient presents with  . Chest Pain    HPI Amber Silva is a 27 y.o. female.  27yo F w/ PMH including stage IV colon cancer who p/w chest pain and tachycardia. Around lunchtime today she began having sharp, central, non-radiating chest pain that has become more constant since it began. She has had associated "breathing heavy" and pain is worse w/ coughing or deep breathing. She reports having similar chest pain previously while hospitalized at [facility] in the past. She went to radiation today and they noted her heart was racing so they sent her to ED for further evaluation. She reports mild cough and runny nose, one episode of vomiting earlier today. She has abdominal pain currently which is the same as her chronic pain, has not had her home pain medications today. no fevers, urinary symptoms, recent travel, or h/o blood clots. No drug or alcohol use. No  FH heart disease.   The history is provided by the patient.    Past Medical History:  Diagnosis Date  . Bronchitis   . Colon cancer ([place])   . Pregnancy induced hypertension     There are no active problems to display for this patient.   Past Surgical History:  Procedure Laterality Date  . COLON SURGERY      OB History    No data available       Home Medications    Prior to Admission medications   Medication Sig Start Date End Date Taking? Authorizing Provider  morphine (MS CONTIN) 15 MG 12 hr tablet Take 15 mg by mouth every 12 (twelve) hours.   Yes [provider]  morphine (MSIR) 15 MG tablet Take 15 mg by mouth every 4 (four) hours as needed for severe pain.   Yes [provider]  oxycodone (OXY-IR) 5 MG capsule Take 5 mg by mouth every 4 (four) hours as needed.    [provider]  potassium chloride (K-DUR) 10 MEQ tablet  Take 10 mEq by mouth daily.    [provider]    Family History History reviewed. No pertinent family history.  Social History Social History   Tobacco Use  . Smoking status: Never Smoker  . Smokeless tobacco: Never Used  Substance Use Topics  . Alcohol use: No  . Drug use: No     Allergies   Patient has no known allergies.   Review of Systems Review of Systems All other systems reviewed and are negative except that which was mentioned in HPI   Physical Exam Updated Vital Signs BP 108/66   Pulse (!) 110   Temp 99 F (37.2 C) (Oral)   Resp 20   Ht 5\' 4"  (1.626 m)   Wt 59.4 kg (131 lb)   LMP 06/05/2017 (Approximate)   SpO2 100%   BMI 22.49 kg/m   Physical Exam  Constitutional: She is oriented to person, place, and time. She appears well-developed and well-nourished. No distress.  HENT:  Head: Normocephalic and atraumatic.  Moist mucous membranes  Eyes: Conjunctivae are normal. Pupils are equal, round, and reactive to light.  Neck: Neck supple.  Cardiovascular: Regular rhythm and normal heart sounds. Tachycardia present.  No murmur heard. Pulmonary/Chest: Effort normal and breath sounds normal.  Abdominal: Soft. Bowel sounds are normal. She exhibits no distension. There is tenderness.  Generalized  abdominal tenderness, colostomy draining normal appearing stool  Musculoskeletal: She exhibits no edema.  Neurological: She is alert and oriented to person, place, and time.  Fluent speech  Skin: Skin is warm and dry.  Psychiatric: Judgment normal.  Depressed mood  Nursing note and vitals reviewed.    ED Treatments / Results  Labs (all labs ordered are listed, but only abnormal results are displayed) Labs Reviewed  COMPREHENSIVE METABOLIC PANEL - Abnormal; Notable for the following components:      Result Value   Potassium 2.9 (*)    Chloride 99 (*)    Calcium 8.6 (*)    Albumin 2.7 (*)    ALT 10 (*)    All other components within normal  limits  CBC WITH DIFFERENTIAL/PLATELET - Abnormal; Notable for the following components:   RBC 3.13 (*)    Hemoglobin 8.8 (*)    HCT 27.0 (*)    Lymphs Abs 0.4 (*)    All other components within normal limits  TROPONIN I  BRAIN NATRIURETIC PEPTIDE  PREGNANCY, URINE  MAGNESIUM  TROPONIN I    EKG  EKG Interpretation  Date/Time:  Wednesday July 06 2017 17:30:44 EST Ventricular Rate:  113 PR Interval:  128 QRS Duration: 72 QT Interval:  328 QTC Calculation: 449 R Axis:   64 Text Interpretation:  Sinus tachycardia Nonspecific T wave abnormality Abnormal ECG mild diffuse T wave inversions compared to previous with inferior Q waves Confirmed by Theotis Burrow [phone]) on 07/06/2017 5:44:14 PM       Radiology Ct Angio Chest Pe W/cm &/or Wo Cm  Result Date: 07/06/2017 CLINICAL DATA:  Colon carcinoma.  Chest pain. EXAM: CT ANGIOGRAPHY CHEST WITH CONTRAST TECHNIQUE: Multidetector CT imaging of the chest was performed using the standard protocol during bolus administration of intravenous contrast. Multiplanar CT image reconstructions and MIPs were obtained to evaluate the vascular anatomy. CONTRAST:  59mL ISOVUE-370 IOPAMIDOL (ISOVUE-370) INJECTION 76% COMPARISON:  Chest CT March 13, 2017; chest radiograph July 06, 2017 FINDINGS: Cardiovascular: There is no demonstrable pulmonary embolus. There is no thoracic aortic aneurysm or dissection. No pericardial effusion or pericardial thickening evident. Port-A-Cath tip is in the superior vena cava. Mediastinum/Nodes: Thyroid appears normal. There is no appreciable thoracic adenopathy. No esophageal lesions are evident. Lungs/Pleura: Lungs are clear. No pleural effusion or pleural thickening evident. Upper Abdomen: There are mass lesions within the liver consistent with metastases. There is a lesion in the right lobe of the liver near the dome measuring 3.8 x 3.5 cm which is essentially stable. A lesion more anteriorly in the right lobe of the liver  near the dome measures 2.5 x 2.4 cm, similar to prior study. There is a degree of ascites in the visualized upper abdomen. Musculoskeletal: No blastic or lytic bone lesions are evident. Review of the MIP images confirms the above findings. IMPRESSION: 1.  No demonstrable pulmonary embolus. 2. Essentially stable liver metastases in the dome of the liver region. 3.  There is a degree of ascites in the upper abdomen. 4.  Lungs clear. 5.  No evident thoracic adenopathy. Electronically Signed   By: Lowella Grip III M.D.   On: 07/06/2017 19:33    Procedures Procedures (including critical care time)  Medications Ordered in ED Medications  morphine (MS CONTIN) 12 hr tablet 15 mg (15 mg Oral Not Given 07/06/17 2200)  potassium chloride 10 mEq in 100 mL IVPB (0 mEq Intravenous Stopped 07/06/17 2254)  sodium chloride 0.9 % bolus 1,000 mL (0 mLs Intravenous  Stopped 07/06/17 2255)  fentaNYL (SUBLIMAZE) injection 100 mcg (100 mcg Intravenous Given 07/06/17 1940)  iopamidol (ISOVUE-370) 76 % injection 100 mL (82 mLs Intravenous Contrast Given 07/06/17 1914)  potassium chloride 20 MEQ/15ML (10%) solution 40 mEq (40 mEq Oral Given 07/06/17 1941)     Initial Impression / Assessment and Plan / ED Course  I have reviewed the triage vital signs and the nursing notes.  Pertinent labs & imaging results that were available during my care of the patient were reviewed by me and considered in my medical decision making (see chart for details).     PT w/ metastatic colon CA sent from radiation to the ER for evaluation of tachycardia and reports of chest pain today.  Tachycardic on arrival, remainder of vital signs were normal.  No signs or symptoms of sepsis.  EKG shows sinus tachycardia with inferior Q waves.  Labs notable for potassium 2.9, 8 of serial troponins, hemoglobin 8.8 which is similar to recent hemoglobin, normal BNP.  Gave oral and IV potassium repletion as well as fluid bolus and pain medication for her cancer  related abdominal pain.  Concern for PE given tachycardia and cancer, obtain CTA which was negative for PE, no acute findings to explain symptoms.  On reexamination she was resting comfortably, heart rate 102.  She has no major risk factors for early heart disease and given reassuring workup I feel she is safe for discharge.  I have recommended she follow closely with her outpatient providers and I have extensively reviewed return precautions.  She voiced understanding and was discharged in satisfactory condition.  Final Clinical Impressions(s) / ED Diagnoses   Final diagnoses:  Nonspecific chest pain  Tachycardia    ED Discharge Orders    None       Little, Wenda Overland, MD 07/06/17 2320

## 2017-07-06 NOTE — ED Triage Notes (Addendum)
Patient reports she was receiving radiation today for colon cancer.  States that she began having heart palpitations and chest pain during this.  Referred by clinic to [facility] ER.  Patient states "they were taking too long".  Patient taking oral chemo at present.

## 2017-08-09 ENCOUNTER — Encounter: Payer: Self-pay | Admitting: Oncology

## 2017-08-09 ENCOUNTER — Telehealth: Payer: Self-pay | Admitting: Oncology

## 2017-08-09 NOTE — Telephone Encounter (Signed)
Appt has been scheduled for the pt to see Dr. Benay Spice on 2/19 at 2pm. Pt is seeking a second opinion. Letter mailed to the pt.

## 2017-08-11 ENCOUNTER — Emergency Department (HOSPITAL_BASED_OUTPATIENT_CLINIC_OR_DEPARTMENT_OTHER): Payer: Medicaid Other

## 2017-08-11 ENCOUNTER — Encounter (HOSPITAL_BASED_OUTPATIENT_CLINIC_OR_DEPARTMENT_OTHER): Payer: Self-pay | Admitting: Emergency Medicine

## 2017-08-11 ENCOUNTER — Inpatient Hospital Stay (HOSPITAL_BASED_OUTPATIENT_CLINIC_OR_DEPARTMENT_OTHER)
Admission: EM | Admit: 2017-08-11 | Discharge: 2017-08-23 | DRG: 374 | Disposition: A | Discharge status: Home Health | Payer: Medicaid Other | Attending: Family Medicine | Admitting: Family Medicine

## 2017-08-11 ENCOUNTER — Other Ambulatory Visit: Payer: Self-pay

## 2017-08-11 DIAGNOSIS — R7989 Other specified abnormal findings of blood chemistry: Secondary | ICD-10-CM

## 2017-08-11 DIAGNOSIS — Z800 Family history of malignant neoplasm of digestive organs: Secondary | ICD-10-CM

## 2017-08-11 DIAGNOSIS — C787 Secondary malignant neoplasm of liver and intrahepatic bile duct: Secondary | ICD-10-CM | POA: Diagnosis present

## 2017-08-11 DIAGNOSIS — R188 Other ascites: Secondary | ICD-10-CM

## 2017-08-11 DIAGNOSIS — E8770 Fluid overload, unspecified: Secondary | ICD-10-CM | POA: Diagnosis not present

## 2017-08-11 DIAGNOSIS — C20 Malignant neoplasm of rectum: Principal | ICD-10-CM

## 2017-08-11 DIAGNOSIS — Z7901 Long term (current) use of anticoagulants: Secondary | ICD-10-CM

## 2017-08-11 DIAGNOSIS — E875 Hyperkalemia: Secondary | ICD-10-CM | POA: Diagnosis present

## 2017-08-11 DIAGNOSIS — C786 Secondary malignant neoplasm of retroperitoneum and peritoneum: Secondary | ICD-10-CM | POA: Diagnosis present

## 2017-08-11 DIAGNOSIS — R180 Malignant ascites: Secondary | ICD-10-CM | POA: Diagnosis present

## 2017-08-11 DIAGNOSIS — E440 Moderate protein-calorie malnutrition: Secondary | ICD-10-CM

## 2017-08-11 DIAGNOSIS — R140 Abdominal distension (gaseous): Secondary | ICD-10-CM | POA: Diagnosis present

## 2017-08-11 DIAGNOSIS — Z6824 Body mass index (BMI) 24.0-24.9, adult: Secondary | ICD-10-CM

## 2017-08-11 DIAGNOSIS — R945 Abnormal results of liver function studies: Secondary | ICD-10-CM

## 2017-08-11 DIAGNOSIS — Z86718 Personal history of other venous thrombosis and embolism: Secondary | ICD-10-CM

## 2017-08-11 DIAGNOSIS — Z933 Colostomy status: Secondary | ICD-10-CM

## 2017-08-11 DIAGNOSIS — K5900 Constipation, unspecified: Secondary | ICD-10-CM

## 2017-08-11 DIAGNOSIS — Z923 Personal history of irradiation: Secondary | ICD-10-CM

## 2017-08-11 DIAGNOSIS — D649 Anemia, unspecified: Secondary | ICD-10-CM | POA: Diagnosis present

## 2017-08-11 DIAGNOSIS — R0600 Dyspnea, unspecified: Secondary | ICD-10-CM

## 2017-08-11 DIAGNOSIS — R64 Cachexia: Secondary | ICD-10-CM | POA: Diagnosis present

## 2017-08-11 DIAGNOSIS — E871 Hypo-osmolality and hyponatremia: Secondary | ICD-10-CM

## 2017-08-11 DIAGNOSIS — R1084 Generalized abdominal pain: Secondary | ICD-10-CM

## 2017-08-11 DIAGNOSIS — E43 Unspecified severe protein-calorie malnutrition: Secondary | ICD-10-CM | POA: Diagnosis present

## 2017-08-11 DIAGNOSIS — R000 Tachycardia, unspecified: Secondary | ICD-10-CM | POA: Diagnosis present

## 2017-08-11 DIAGNOSIS — G893 Neoplasm related pain (acute) (chronic): Secondary | ICD-10-CM | POA: Diagnosis present

## 2017-08-11 DIAGNOSIS — Z9221 Personal history of antineoplastic chemotherapy: Secondary | ICD-10-CM

## 2017-08-11 DIAGNOSIS — Z801 Family history of malignant neoplasm of trachea, bronchus and lung: Secondary | ICD-10-CM

## 2017-08-11 HISTORY — DX: Colostomy status: Z93.3

## 2017-08-11 HISTORY — DX: Other ascites: R18.8

## 2017-08-11 LAB — COMPREHENSIVE METABOLIC PANEL
ALT: 8 U/L — ABNORMAL LOW (ref 14–54)
ANION GAP: 13 (ref 5–15)
AST: 23 U/L (ref 15–41)
Albumin: 2.1 g/dL — ABNORMAL LOW (ref 3.5–5.0)
Alkaline Phosphatase: 131 U/L — ABNORMAL HIGH (ref 38–126)
BUN: 16 mg/dL (ref 6–20)
CHLORIDE: 92 mmol/L — AB (ref 101–111)
CO2: 23 mmol/L (ref 22–32)
Calcium: 9.2 mg/dL (ref 8.9–10.3)
Creatinine, Ser: 0.93 mg/dL (ref 0.44–1.00)
Glucose, Bld: 105 mg/dL — ABNORMAL HIGH (ref 65–99)
POTASSIUM: 5 mmol/L (ref 3.5–5.1)
Sodium: 128 mmol/L — ABNORMAL LOW (ref 135–145)
Total Bilirubin: 0.7 mg/dL (ref 0.3–1.2)
Total Protein: 7.4 g/dL (ref 6.5–8.1)

## 2017-08-11 LAB — CBC WITH DIFFERENTIAL/PLATELET
BASOS ABS: 0 10*3/uL (ref 0.0–0.1)
Basophils Relative: 0 %
Eosinophils Absolute: 0 10*3/uL (ref 0.0–0.7)
Eosinophils Relative: 0 %
HCT: 28.5 % — ABNORMAL LOW (ref 36.0–46.0)
HEMOGLOBIN: 9.1 g/dL — AB (ref 12.0–15.0)
LYMPHS ABS: 0.2 10*3/uL — AB (ref 0.7–4.0)
LYMPHS PCT: 2 %
MCH: 26.1 pg (ref 26.0–34.0)
MCHC: 31.9 g/dL (ref 30.0–36.0)
MCV: 81.9 fL (ref 78.0–100.0)
Monocytes Absolute: 0.5 10*3/uL (ref 0.1–1.0)
Monocytes Relative: 5 %
NEUTROS ABS: 8.8 10*3/uL — AB (ref 1.7–7.7)
NEUTROS PCT: 93 %
Platelets: 547 10*3/uL — ABNORMAL HIGH (ref 150–400)
RBC: 3.48 MIL/uL — AB (ref 3.87–5.11)
RDW: 16.5 % — ABNORMAL HIGH (ref 11.5–15.5)
WBC: 9.5 10*3/uL (ref 4.0–10.5)

## 2017-08-11 LAB — GRAM STAIN: GRAM STAIN: NONE SEEN

## 2017-08-11 LAB — CBG MONITORING, ED: Glucose-Capillary: 111 mg/dL — ABNORMAL HIGH (ref 65–99)

## 2017-08-11 LAB — SYNOVIAL CELL COUNT + DIFF, W/ CRYSTALS
CRYSTALS FLUID: NONE SEEN
Eosinophils-Synovial: 0 % (ref 0–1)
Lymphocytes-Synovial Fld: 34 % — ABNORMAL HIGH (ref 0–20)
Monocyte-Macrophage-Synovial Fluid: 36 % — ABNORMAL LOW (ref 50–90)
Neutrophil, Synovial: 30 % — ABNORMAL HIGH (ref 0–25)
WBC, SYNOVIAL: 128 /mm3 (ref 0–200)

## 2017-08-11 LAB — APTT: aPTT: 48 seconds — ABNORMAL HIGH (ref 24–36)

## 2017-08-11 LAB — PROTIME-INR
INR: 1.52
Prothrombin Time: 18.2 seconds — ABNORMAL HIGH (ref 11.4–15.2)

## 2017-08-11 LAB — I-STAT CG4 LACTIC ACID, ED: LACTIC ACID, VENOUS: 1.46 mmol/L (ref 0.5–1.9)

## 2017-08-11 MED ORDER — LIDOCAINE HCL (PF) 1 % IJ SOLN
10.0000 mL | Freq: Once | INTRAMUSCULAR | Status: AC
  Administered 2017-08-11: 10 mL
  Filled 2017-08-11: qty 10

## 2017-08-11 MED ORDER — ONDANSETRON HCL 4 MG/2ML IJ SOLN
4.0000 mg | Freq: Once | INTRAMUSCULAR | Status: AC
  Administered 2017-08-11: 4 mg via INTRAVENOUS
  Filled 2017-08-11: qty 2

## 2017-08-11 MED ORDER — SODIUM CHLORIDE 0.9 % IV SOLN
Freq: Once | INTRAVENOUS | Status: AC
  Administered 2017-08-11: 11:00:00 via INTRAVENOUS
  Administered 2017-08-12: 1000 mL via INTRAVENOUS

## 2017-08-11 MED ORDER — SODIUM CHLORIDE 0.9 % IV BOLUS (SEPSIS)
1000.0000 mL | Freq: Once | INTRAVENOUS | Status: AC
  Administered 2017-08-11: 1000 mL via INTRAVENOUS

## 2017-08-11 MED ORDER — IOPAMIDOL (ISOVUE-300) INJECTION 61%
100.0000 mL | Freq: Once | INTRAVENOUS | Status: AC | PRN
  Administered 2017-08-11: 100 mL via INTRAVENOUS

## 2017-08-11 MED ORDER — HYDROMORPHONE HCL 1 MG/ML IJ SOLN
1.0000 mg | INTRAMUSCULAR | Status: DC | PRN
  Administered 2017-08-11 (×2): 1 mg via INTRAVENOUS
  Filled 2017-08-11 (×2): qty 1

## 2017-08-11 MED ORDER — HYDROMORPHONE HCL 1 MG/ML IJ SOLN
1.0000 mg | INTRAMUSCULAR | Status: DC | PRN
  Administered 2017-08-11 – 2017-08-14 (×40): 1 mg via INTRAVENOUS
  Filled 2017-08-11 (×41): qty 1

## 2017-08-11 NOTE — ED Notes (Signed)
Patient transported to CT 

## 2017-08-11 NOTE — ED Provider Notes (Signed)
Emergency Department Provider Note   I have reviewed the triage vital signs and the nursing notes.   HISTORY  Chief Complaint Abdominal Pain   HPI Amber Silva is a 27 y.o. female with history of active stage IV colon cancer recently receiving radiation and chemotherapy, malignant ascites requiring LVP last week, and DVT on Eliquis presents to the emergency department with 2 days of worsening abdominal pain and distention.  Patient states she feels like her fluid is reaccumulating.  She denies fevers or chills.  Her pain is diffuse and nonradiating.  Pain worse with palpation of the abdomen.  She continues to have output in her colostomy bag.  Several days ago she saw some blood there but that has resolved.  She has nausea but no vomiting.  She has very low appetite.  Any dysuria, hesitancy, urgency.  Patient also experiencing some burning discomfort in the right upper leg.    Past Medical History:  Diagnosis Date  . Ascites   . Bronchitis   . Colon cancer ([place])   . Pregnancy induced hypertension   . S/P colostomy ([place])     There are no active problems to display for this patient.   Past Surgical History:  Procedure Laterality Date  . COLON SURGERY      Current Outpatient Rx  . Order #: [id number] Class: Historical Med  . Order #: [id number] Class: Historical Med    Allergies Patient has no known allergies.  No family history on file.  Social History Social History   Tobacco Use  . Smoking status: Never Smoker  . Smokeless tobacco: Never Used  Substance Use Topics  . Alcohol use: No  . Drug use: No    Review of Systems  Constitutional: No fever/chills Eyes: No visual changes. ENT: No sore throat. Cardiovascular: Denies chest pain. Respiratory: Positive shortness of breath if laying flat.  Gastrointestinal: Positive abdominal pain. Positive nausea, no vomiting.  No diarrhea.  No constipation. Positive abdominal distension.  Genitourinary: Negative for  dysuria. Musculoskeletal: Negative for back pain. Skin: Negative for rash. Neurological: Negative for headaches, focal weakness or numbness.  10-point ROS otherwise negative.  ____________________________________________   PHYSICAL EXAM:  VITAL SIGNS: ED Triage Vitals  Enc Vitals Group     BP --      Pulse Rate 08/11/17 0754 (!) 136     Resp 08/11/17 0754 (!) 24     Temp 08/11/17 0754 97.7 F (36.5 C)     Temp Source 08/11/17 0754 Oral     SpO2 08/11/17 0754 100 %     Weight 08/11/17 0753 140 lb (63.5 kg)     Height 08/11/17 0753 5\' 3"  (1.6 m)    Constitutional: Alert and oriented. Patient appears chronically ill and cachectic.  Eyes: Conjunctivae are normal.  Head: Atraumatic. Nose: No congestion/rhinnorhea. Mouth/Throat: Mucous membranes are moist.  Oropharynx non-erythematous. Neck: No stridor.   Cardiovascular: Sinus tachycardia. Good peripheral circulation. Grossly normal heart sounds.   Respiratory: Normal respiratory effort.  No retractions. Lungs CTAB. Gastrointestinal: Tense, distended abdomen with diffuse tenderness. No rebound. Colostomy with non-bloody output in bag. No cellulitis surrounding the stoma.  Musculoskeletal: No lower extremity tenderness nor edema. No gross deformities of extremities.  Neurologic:  Normal speech and language. No gross focal neurologic deficits are appreciated.  Skin:  Skin is warm, dry and intact. No rash noted.   ____________________________________________   LABS (all labs ordered are listed, but only abnormal results are displayed)  Labs Reviewed  COMPREHENSIVE METABOLIC PANEL -  Abnormal; Notable for the following components:      Result Value   Sodium 128 (*)    Chloride 92 (*)    Glucose, Bld 105 (*)    Albumin 2.1 (*)    ALT 8 (*)    Alkaline Phosphatase 131 (*)    All other components within normal limits  CBC WITH DIFFERENTIAL/PLATELET - Abnormal; Notable for the following components:   RBC 3.48 (*)     Hemoglobin 9.1 (*)    HCT 28.5 (*)    RDW 16.5 (*)    Platelets 547 (*)    Neutro Abs 8.8 (*)    Lymphs Abs 0.2 (*)    All other components within normal limits  APTT - Abnormal; Notable for the following components:   aPTT 48 (*)    All other components within normal limits  PROTIME-INR - Abnormal; Notable for the following components:   Prothrombin Time 18.2 (*)    All other components within normal limits  SYNOVIAL CELL COUNT + DIFF, W/ CRYSTALS - Abnormal; Notable for the following components:   Color, Synovial AMBER (*)    Appearance-Synovial CLOUDY (*)    Neutrophil, Synovial 30 (*)    Lymphocytes-Synovial Fld 34 (*)    Monocyte-Macrophage-Synovial Fluid 36 (*)    All other components within normal limits  CBG MONITORING, ED - Abnormal; Notable for the following components:   Glucose-Capillary 111 (*)    All other components within normal limits  CULTURE, BLOOD (ROUTINE X 2)  CULTURE, BLOOD (ROUTINE X 2)  URINE CULTURE  CULTURE, BODY FLUID-BOTTLE  GRAM STAIN  URINALYSIS, ROUTINE W REFLEX MICROSCOPIC  GLUCOSE, BODY FLUID OTHER  I-STAT CG4 LACTIC ACID, ED  I-STAT CG4 LACTIC ACID, ED   ____________________________________________  EKG   EKG Interpretation  Date/Time:  Thursday August 11 2017 08:00:31 EST Ventricular Rate:  132 PR Interval:    QRS Duration: 92 QT Interval:  298 QTC Calculation: 442 R Axis:   105 Text Interpretation:  Sinus tachycardia Borderline right axis deviation No STEMI.  Confirmed by Nanda Quinton [phone]) on 08/11/2017 8:04:19 AM Also confirmed by Nanda Quinton [phone]), editor Lynder Parents [phone])  on 08/11/2017 8:20:39 AM       ____________________________________________  RADIOLOGY  Ct Abdomen Pelvis W Contrast  Result Date: 08/11/2017 CLINICAL DATA:  Generalized acute abdominal pain EXAM: CT ABDOMEN AND PELVIS WITH CONTRAST TECHNIQUE: Multidetector CT imaging of the abdomen and pelvis was performed using the standard  protocol following bolus administration of intravenous contrast. CONTRAST:  134mL ISOVUE-300 IOPAMIDOL (ISOVUE-300) INJECTION 61% COMPARISON:  04/17/2017. report from abdominal CT 07/28/2017 at [facility]. FINDINGS: Lower chest: Pericardial fluid that appears low-density. Porta catheter tip extends to the tricuspid valve. Atelectasis in the right more than left lower lobes. Hepatobiliary: Hepatic metastases that have increased from prior, up to 4.7 cm in the upper right liver. The liver is steatotic.No evidence of biliary obstruction or stone. Pancreas: Unremarkable. Spleen: Unremarkable. Adrenals/Urinary Tract: Negative adrenals. No hydronephrosis or stone. Unremarkable bladder. Stomach/Bowel: Patient has rectal cancer with large rectal mass that is low-density peripherally and enhancing centrally. There is dystrophic calcifications about the mass which may reflect treatment change. The mass appears necrotic and with discrete track extending towards the vagina/perineum. This is known per comparison report. No pneumoperitoneum. There has been interval development of massive ascites with enhancing septations best seen about the liver, which is scalloped. There is diffuse caking of the omentum. Descending colostomy which is bulging due to peritoneal metastases. Diffuse stool  in the colon. Negative for small bowel obstruction. Vascular/Lymphatic: Large thrombus in the left femoral and common femoral veins. No acute arterial finding. No mass or adenopathy. Reproductive:No pathologic findings. Other: Ascites noted above. Musculoskeletal: No acute abnormalities. Critical Value/emergent results were called by telephone at the time of interpretation on 08/11/2017 at 10:14 am to Dr. Nanda Quinton , who verbally acknowledged these results. IMPRESSION: 1. Rectal cancer with extensive peritoneal metastatic disease and massive ascites. The abdomen appears tense. Known hepatic metastatic disease. 2. Necrotic primary rectal cancer  with visible tract into the vagina/perineum. 3. Extensive DVT in the left femoral and common femoral veins. 4. Anasarca and small pericardial effusion. Electronically Signed   By: Monte Fantasia M.D.   On: 08/11/2017 10:18    ____________________________________________   PROCEDURES  Procedure(s) performed:   .Paracentesis  Date/Time: 08/11/2017 10:39 AM  Performed by: Margette Fast, MD  Authorized by: Margette Fast, MD   Consent:    Consent obtained:  Verbal   Consent given by:  Patient   Risks discussed:  Bowel perforation, bleeding, infection and pain   Alternatives discussed:  Alternative treatment and delayed treatment Pre-procedure details:    Procedure purpose:  Diagnostic   Preparation: Patient was prepped and draped in usual sterile fashion   Anesthesia (see MAR for exact dosages):    Anesthesia method:  Local infiltration   Local anesthetic:  Lidocaine 1% w/o epi Procedure details:    Needle gauge:  20   Ultrasound guidance: yes     Puncture site:  R lower quadrant   Fluid removed amount:  60   Fluid appearance:  Serosanguinous   Dressing:  4x4 sterile gauze Post-procedure details:    Patient tolerance of procedure:  Tolerated well, no immediate complications .Critical Care  Performed by: Margette Fast, MD  Authorized by: Margette Fast, MD   Critical care provider statement:    Critical care time (minutes):  50   Critical care time was exclusive of:  Separately billable procedures and treating other patients and teaching time   Critical care was necessary to treat or prevent imminent or life-threatening deterioration of the following conditions:  Circulatory failure, metabolic crisis and dehydration   Critical care was time spent personally by me on the following activities:  Blood draw for specimens, development of treatment plan with patient or surrogate, discussions with consultants, evaluation of patient's response to treatment, examination of patient,  ordering and performing treatments and interventions, ordering and review of laboratory studies, ordering and review of radiographic studies, pulse oximetry, re-evaluation of patient's condition, review of old charts and obtaining history from patient or surrogate   I assumed direction of critical care for this patient from another provider in my specialty: no     _______________________________________   INITIAL IMPRESSION / [place] / ED COURSE  Pertinent labs & imaging results that were available during my care of the patient were reviewed by me and considered in my medical decision making (see chart for details).  Patient presents to the emergency department for evaluation of worsening generalized abdominal pain without fever.  Her abdomen is tense and distended.  She had recent admission to [facility] and was discharged on 08/06/17 with diagnosis of abdominal pain related to worsening metastasis and SBP.  Patient was treated with Cipro upon discharge and had plan to f/u with oncology as an outpatient. Famiyl state that they are seeking to transfer to [facility] and have an appointment  with [facility] later this month.  She is afebrile but tachycardic which could be doing patient's acute pain, dehydration with recent LVP unknown SBP the patient will need at least a diagnostic paracentesis here in the emergency department. Hold abx for now.    11:18 AM Awoke with oncologist at [facility] Dr. Mal Amabile who agrees to accept the patient in transfer.  Diagnostic paracentesis results pending.  They will call back when bed is available and anticipate something early this afternoon.  Plan to continue treating pain and maintenance fluids.  Hold antibiotics for now.  Reassess frequently.  I updated the patient regarding her disposition and plan.  She is in agreement with transfer. EMTALA documentation completed.  ____________________________________________  FINAL  CLINICAL IMPRESSION(S) / ED DIAGNOSES  Final diagnoses:  Generalized abdominal pain  Malignant ascites    MEDICATIONS GIVEN DURING THIS VISIT:  Medications  HYDROmorphone (DILAUDID) injection 1 mg (1 mg Intravenous Given 08/11/17 1432)  ondansetron (ZOFRAN) injection 4 mg (4 mg Intravenous Given 08/11/17 0853)  sodium chloride 0.9 % bolus 1,000 mL (0 mLs Intravenous Stopped 08/11/17 1121)  lidocaine (PF) (XYLOCAINE) 1 % injection 10 mL (10 mLs Infiltration Given 08/11/17 0853)  iopamidol (ISOVUE-300) 61 % injection 100 mL (100 mLs Intravenous Contrast Given 08/11/17 0940)  0.9 %  sodium chloride infusion ( Intravenous New Bag/Given 08/11/17 1121)    Note:  This document was prepared using Dragon voice recognition software and may include unintentional dictation errors.  Nanda Quinton, MD Emergency Medicine   Izzabella Besse, [facility], MD 08/11/17 [phone]

## 2017-08-11 NOTE — ED Notes (Signed)
Eggcrate mattress applied to her bed.

## 2017-08-11 NOTE — ED Notes (Addendum)
Called [facility]  to follow up on patient placement and they advised a bed would not be ready until around noon on 08/12/17 for patient tranfer.

## 2017-08-11 NOTE — ED Notes (Signed)
Pt states she doesn't need her colostomy bag emptied at this time.  Pt given popchips per request.  Pt taking apple juice po

## 2017-08-11 NOTE — ED Triage Notes (Signed)
Pt having abdominal pain x 2 days.  Pt recently discharged Saturday from [facility] for ongoing issues with her cancer.  Family states she is not been eating well if at all.  Pt states she has nausea consistently.  Pt has colostomy which has been functioning at home.  Family relates that they tapped her abdomen while hospitalized to remove ascites fluid.  Pt is in the process of switching oncology practice to [facility] physician.  See epic.

## 2017-08-12 ENCOUNTER — Encounter (HOSPITAL_COMMUNITY): Payer: Self-pay | Admitting: *Deleted

## 2017-08-12 ENCOUNTER — Inpatient Hospital Stay (HOSPITAL_COMMUNITY): Payer: Medicaid Other

## 2017-08-12 DIAGNOSIS — Z923 Personal history of irradiation: Secondary | ICD-10-CM | POA: Diagnosis not present

## 2017-08-12 DIAGNOSIS — Z86718 Personal history of other venous thrombosis and embolism: Secondary | ICD-10-CM | POA: Diagnosis not present

## 2017-08-12 DIAGNOSIS — Z515 Encounter for palliative care: Secondary | ICD-10-CM | POA: Diagnosis not present

## 2017-08-12 DIAGNOSIS — R140 Abdominal distension (gaseous): Secondary | ICD-10-CM | POA: Diagnosis present

## 2017-08-12 DIAGNOSIS — Z9221 Personal history of antineoplastic chemotherapy: Secondary | ICD-10-CM | POA: Diagnosis not present

## 2017-08-12 DIAGNOSIS — E8770 Fluid overload, unspecified: Secondary | ICD-10-CM | POA: Diagnosis not present

## 2017-08-12 DIAGNOSIS — Z801 Family history of malignant neoplasm of trachea, bronchus and lung: Secondary | ICD-10-CM | POA: Diagnosis not present

## 2017-08-12 DIAGNOSIS — R180 Malignant ascites: Secondary | ICD-10-CM | POA: Diagnosis present

## 2017-08-12 DIAGNOSIS — Z7901 Long term (current) use of anticoagulants: Secondary | ICD-10-CM | POA: Diagnosis not present

## 2017-08-12 DIAGNOSIS — E871 Hypo-osmolality and hyponatremia: Secondary | ICD-10-CM | POA: Diagnosis not present

## 2017-08-12 DIAGNOSIS — C20 Malignant neoplasm of rectum: Secondary | ICD-10-CM | POA: Diagnosis present

## 2017-08-12 DIAGNOSIS — E43 Unspecified severe protein-calorie malnutrition: Secondary | ICD-10-CM | POA: Diagnosis present

## 2017-08-12 DIAGNOSIS — D649 Anemia, unspecified: Secondary | ICD-10-CM | POA: Diagnosis present

## 2017-08-12 DIAGNOSIS — C787 Secondary malignant neoplasm of liver and intrahepatic bile duct: Secondary | ICD-10-CM | POA: Diagnosis present

## 2017-08-12 DIAGNOSIS — G893 Neoplasm related pain (acute) (chronic): Secondary | ICD-10-CM | POA: Diagnosis present

## 2017-08-12 DIAGNOSIS — R0600 Dyspnea, unspecified: Secondary | ICD-10-CM | POA: Diagnosis not present

## 2017-08-12 DIAGNOSIS — E440 Moderate protein-calorie malnutrition: Secondary | ICD-10-CM | POA: Diagnosis not present

## 2017-08-12 DIAGNOSIS — E875 Hyperkalemia: Secondary | ICD-10-CM | POA: Diagnosis present

## 2017-08-12 DIAGNOSIS — Z800 Family history of malignant neoplasm of digestive organs: Secondary | ICD-10-CM | POA: Diagnosis not present

## 2017-08-12 DIAGNOSIS — Z6824 Body mass index (BMI) 24.0-24.9, adult: Secondary | ICD-10-CM | POA: Diagnosis not present

## 2017-08-12 DIAGNOSIS — R64 Cachexia: Secondary | ICD-10-CM | POA: Diagnosis present

## 2017-08-12 DIAGNOSIS — Z933 Colostomy status: Secondary | ICD-10-CM | POA: Diagnosis not present

## 2017-08-12 DIAGNOSIS — C786 Secondary malignant neoplasm of retroperitoneum and peritoneum: Secondary | ICD-10-CM | POA: Diagnosis present

## 2017-08-12 DIAGNOSIS — R000 Tachycardia, unspecified: Secondary | ICD-10-CM | POA: Diagnosis present

## 2017-08-12 DIAGNOSIS — R1084 Generalized abdominal pain: Secondary | ICD-10-CM | POA: Diagnosis present

## 2017-08-12 LAB — TSH: TSH: 8.362 u[IU]/mL — ABNORMAL HIGH (ref 0.350–4.500)

## 2017-08-12 LAB — APTT: aPTT: 37 seconds — ABNORMAL HIGH (ref 24–36)

## 2017-08-12 LAB — HEPARIN LEVEL (UNFRACTIONATED): Heparin Unfractionated: >2.2 IU/mL — ABNORMAL HIGH (ref 0.30–0.70)

## 2017-08-12 LAB — GLUCOSE, BODY FLUID OTHER: Glucose, Body Fluid Other: 63 mg/dL

## 2017-08-12 MED ORDER — SODIUM CHLORIDE 0.9% FLUSH: 10.0000 mL | INTRAVENOUS | Status: DC | PRN

## 2017-08-12 MED ORDER — MORPHINE SULFATE 15 MG PO TABS
15.0000 mg | ORAL_TABLET | ORAL | Status: DC | PRN
  Administered 2017-08-12 – 2017-08-20 (×2): 15 mg via ORAL
  Filled 2017-08-12 (×2): qty 1

## 2017-08-12 MED ORDER — POLYETHYLENE GLYCOL 3350 17 G PO PACK
17.0000 g | PACK | Freq: Every day | ORAL | Status: DC
  Administered 2017-08-12 – 2017-08-15 (×4): 17 g via ORAL
  Filled 2017-08-12 (×4): qty 1

## 2017-08-12 MED ORDER — CIPROFLOXACIN HCL 500 MG PO TABS
500.0000 mg | ORAL_TABLET | Freq: Two times a day (BID) | ORAL | Status: AC
  Administered 2017-08-12 – 2017-08-17 (×11): 500 mg via ORAL
  Filled 2017-08-12 (×11): qty 1

## 2017-08-12 MED ORDER — PROMETHAZINE HCL 25 MG/ML IJ SOLN
12.5000 mg | Freq: Four times a day (QID) | INTRAMUSCULAR | Status: DC | PRN
  Administered 2017-08-12 – 2017-08-23 (×17): 12.5 mg via INTRAVENOUS
  Filled 2017-08-12 (×18): qty 1

## 2017-08-12 MED ORDER — ONDANSETRON HCL 4 MG/2ML IJ SOLN
4.0000 mg | Freq: Four times a day (QID) | INTRAMUSCULAR | Status: DC | PRN
  Administered 2017-08-12 – 2017-08-20 (×7): 4 mg via INTRAVENOUS
  Filled 2017-08-12 (×8): qty 2

## 2017-08-12 MED ORDER — FENTANYL 50 MCG/HR TD PT72
50.0000 ug | MEDICATED_PATCH | TRANSDERMAL | Status: DC
  Administered 2017-08-12: 50 ug via TRANSDERMAL
  Filled 2017-08-12: qty 1

## 2017-08-12 MED ORDER — HEPARIN (PORCINE) IN NACL 100-0.45 UNIT/ML-% IJ SOLN
1200.0000 [IU]/h | INTRAMUSCULAR | Status: DC
Start: 2017-08-12 — End: 2017-08-13
  Administered 2017-08-12: 1200 [IU]/h via INTRAVENOUS
  Filled 2017-08-12: qty 250

## 2017-08-12 MED ORDER — SENNOSIDES-DOCUSATE SODIUM 8.6-50 MG PO TABS: 1.0000 | ORAL_TABLET | Freq: Every evening | ORAL | Status: DC | PRN

## 2017-08-12 NOTE — ED Notes (Signed)
Called and canceled transport to [facility].

## 2017-08-12 NOTE — Progress Notes (Addendum)
ANTICOAGULATION CONSULT NOTE - Initial Consult  Pharmacy Consult for heparin Indication: DVT (diagnosed during 1/24 admission at [facility])  No Known Allergies  Patient Measurements: Height: 5\' 3"  (160 cm) Weight: 149 lb 4 oz (67.7 kg) IBW/kg (Calculated) : 52.4 Heparin Dosing Weight: 66.2 kg  Vital Signs: Temp: 98 F (36.7 C) (02/08 1700) Temp Source: Oral (02/08 1700) BP: 133/100 (02/08 1700) Pulse Rate: 128 (02/08 1700)  Labs: Recent Labs    08/11/17 0850  HGB 9.1*  HCT 28.5*  PLT 547*  APTT 48*  LABPROT 18.2*  INR 1.52  CREATININE 0.93    Estimated Creatinine Clearance: 83.9 mL/min (by C-G formula based on SCr of 0.93 mg/dL).   Medical History: Past Medical History:  Diagnosis Date  . Ascites   . Bronchitis   . Colon cancer ([place])   . Pregnancy induced hypertension   . S/P colostomy ([place])     Assessment: 35 YOF presents with abdominal pain and swelling.  Patient with stage IV rectal cancer.  She was found to have DVT during her 1/24 admission at [facility].  She was discharged on apixaban. She was taking 5mg  PO BID and with last dose documented being taken 2/6 at 2000.   Baseline anti-Xa level: > 2.20 APTT = 37 sec  Today, 08/12/2017   CBC: Hgb decreased but consistent with 07/06/17 value, pltc elevated  APTT and heparin level pending  apixiban elevates anti-Xa level (aka heparin level) so must use aPTT to monitor heparin gtt until apixaban washed out  Plans for paracentesis by IR 2/9  Goal of Therapy:  Heparin level 0.3-0.7 units/ml aPTT 66-102 sec seconds Monitor platelets by anticoagulation protocol: Yes   Plan:   Once baseline labs checked, start heparin at 1200 units/hr  Check 6h aPTT - use aPTT to dose until anti-Xa level and aPTT correlate  Daily CBC, heparin level  No stop time for paracentesis at this time, discussed with [place], PharmD, BCPS.   Pager: [phone] 08/12/2017 6:54 PM

## 2017-08-12 NOTE — ED Notes (Signed)
Patient given grape juice.

## 2017-08-12 NOTE — Plan of Care (Signed)
27 yo F with abd pain due to massive ascites.  Not SBP based on diagnostic tap.  Needs theraputic tap.  Has metastatic colon cancer.  Care is primarily over at [facility] but they have no beds and family wants 2nd opinion from [facility] anyhow.  Putting in for Tele bed due to persistent tachycardia 130s.  May be a while as we have no beds either.

## 2017-08-12 NOTE — H&P (Signed)
History and Physical    Amber Silva ENI:[id number] DOB: 02/11/1991 DOA: 08/11/2017  I have briefly reviewed the patient's prior medical records in [place]  PCP: [name]., MD  Patient coming from: [facility] ER  Chief Complaint: Abdominal swelling and pain  HPI: Amber Silva is a 27 y.o. 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  She was recently hospitalized at [facility] couple of times for intractable nausea vomiting, abdominal pain abdominal distention, she had a paracentesis last week (her first 1), she was diagnosed with SBP for which she was placed on ciprofloxacin.  She was also diagnosed last week with a left lower extremity DVT for which she was placed on Eliquis.  During her prior hospitalizations, CT of her chest was noted to have a 1.3 cm soft tissue nodule involving the anterior superior pericardium, concerning for pericardial metastasis.  She was also noted to have persistent sinus tachycardia with a heart rate in the 130s 140s, thought to be related to underlying tumor burden, asymptomatic.  Today patient complains of abdominal distention and discomfort/pain, she denies any fever or chills.  She has no chest pain.  She denies any nausea vomiting or diarrhea.  She states that her left leg is still swollen following her DVT diagnosis.  She is not short of breath.  She has been complaining of intermittent pleuritic type left-sided chest pain however not present currently.  She is supposed to see Dr. Benay Spice as an outpatient for second opinion regarding her cancer.  Cancer  history: 7/18- biopsy 8/15/2018Laparoscopic creation of sigmoid colostomy by Dr Cheryll Cockayne 03/09/2017: C1D1 FOLFIRINOX 03/23/2017 C2D1 FOLFIRINOX 04/06/2017 C3D1 FOLFIRINOX  Folinic acid (leucovorin)-5FU-Irinotecan-Oxaliplatin Pelvic RT [place] P[place] Rx with Panitumumab/Folfiri Surgeon: Dr. Morton Stall / Dr. Crisoforo [place] Medical Oncologist: Dr. Jimmy Footman Primary Care Provider: Marina Gravel Kendall Flack, MD   ED Course: In the emergency room her vital signs are stable other than sinus tachycardia which is known, she underwent a CT scan of the abdomen and pelvis (for full report see below) which showed her known extensive metastatic disease, she had a diagnostic paracentesis which showed 128 whites with 30% neutrophils.  Her blood work showed normal renal function, mild anemia as well as hyponatremia  Review of Systems: As per HPI otherwise 10 point review of systems negative.   Past Medical History:  Diagnosis Date  . Ascites   . Bronchitis   . Colon cancer ([place])   . Pregnancy induced hypertension   . S/P colostomy [facility])     Past Surgical History:  Procedure Laterality Date  . COLON SURGERY       reports that  has never smoked. she has never used smokeless tobacco. She reports that she does not drink alcohol or use drugs.  No Known Allergies  Denies family history of colon cancer  Prior to Admission medications   Medication Sig Start Date End Date Taking? Authorizing Provider  ELIQUIS 5 MG TABS tablet Take 5 mg by mouth 2 (two) times daily. 08/06/17  Yes [provider]  fentaNYL (DURAGESIC - DOSED MCG/HR) 50 MCG/HR Place 50 mcg onto the skin every 3 (three)  days.   Yes [provider]  morphine (MSIR) 15 MG tablet Take 15 mg by mouth every 2 (two) hours as needed. 08/06/17  Yes [provider]  potassium chloride (K-DUR) 10 MEQ tablet Take 10 mEq by mouth daily.   Yes [provider]    Physical Exam: Vitals:    08/12/17 1400 08/12/17 1500 08/12/17 1600 08/12/17 1700  BP: 125/90 123/85 (!) 122/91 (!) 133/100  Pulse: (!) 123 (!) 126 (!) 126 (!) 128  Resp: 20 17 14 16   Temp:    98 F (36.7 C)  TempSrc:    Oral  SpO2: 99% 98% 99% 100%  Weight:    67.7 kg (149 lb 4 oz)  Height:    5\' 3"  (1.6 m)    Constitutional: NAD, calm, comfortable, cachectic appearing African-American female Eyes: PERRL, lids and conjunctivae normal, no scleral icterus ENMT: Mucous membranes are moist.  Neck: normal, supple Respiratory: clear to auscultation bilaterally, no wheezing, no crackles. Normal respiratory effort. No accessory muscle use.  Cardiovascular: Regular rate and rhythm, no murmurs / rubs / gallops. No extremity edema. 2+ pedal pulses.  Abdomen: Quite distended, firm, ostomy bag in place, fluid wave positive, nontender to palpation, distant bowel sounds Musculoskeletal: no clubbing / cyanosis.  Decreased muscle tone.  Skin: no rashes.  Several tattoos present Neurologic: CN 2-12 grossly intact. Strength 5/5 in all 4.  Psychiatric: Normal judgment and insight. Alert and oriented x 3. Normal mood.   Labs on Admission: I have personally reviewed following labs and imaging studies EKG -sinus rhythm CT abdomen pelvis -extensive metastatic disease, massive ascites  CBC: Recent Labs  Lab 08/11/17 0850  WBC 9.5  NEUTROABS 8.8*  HGB 9.1*  HCT 28.5*  MCV 81.9  PLT 616*   Basic Metabolic Panel: Recent Labs  Lab 08/11/17 0850  NA 128*  K 5.0  CL 92*  CO2 23  GLUCOSE 105*  BUN 16  CREATININE 0.93  CALCIUM 9.2   GFR: Estimated Creatinine Clearance: 83.9 mL/min (by C-G formula based on SCr of 0.93 mg/dL). Liver Function Tests: Recent Labs  Lab 08/11/17 0850  AST 23  ALT 8*  ALKPHOS 131*  BILITOT 0.7  PROT 7.4  ALBUMIN 2.1*   No results for input(s): LIPASE, AMYLASE in the last 168 hours. No results for input(s): AMMONIA in the last 168 hours. Coagulation Profile: Recent Labs  Lab  08/11/17 0850  INR 1.52   Cardiac Enzymes: No results for input(s): CKTOTAL, CKMB, CKMBINDEX, TROPONINI in the last 168 hours. BNP (last 3 results) No results for input(s): PROBNP in the last 8760 hours. HbA1C: No results for input(s): HGBA1C in the last 72 hours. CBG: Recent Labs  Lab 08/11/17 1126  GLUCAP 111*   Lipid Profile: No results for input(s): CHOL, HDL, LDLCALC, TRIG, CHOLHDL, LDLDIRECT in the last 72 hours. Thyroid Function Tests: No results for input(s): TSH, T4TOTAL, FREET4, T3FREE, THYROIDAB in the last 72 hours. Anemia Panel: No results for input(s): VITAMINB12, FOLATE, FERRITIN, TIBC, IRON, RETICCTPCT in the last 72 hours. Urine analysis:    Component Value Date/Time   COLORURINE YELLOW 04/17/2017 1611   APPEARANCEUR CLEAR 04/17/2017 1611   LABSPEC 1.010 04/17/2017 1611   PHURINE 7.0 04/17/2017 1611   GLUCOSEU NEGATIVE 04/17/2017 1611   HGBUR NEGATIVE 04/17/2017 1611   BILIRUBINUR NEGATIVE 04/17/2017 1611   KETONESUR NEGATIVE 04/17/2017 1611   PROTEINUR NEGATIVE 04/17/2017 1611   UROBILINOGEN >8.0 (H) 02/11/2014 1125   NITRITE NEGATIVE 04/17/2017 1611   LEUKOCYTESUR  NEGATIVE 04/17/2017 1611     Radiological Exams on Admission: Ct Abdomen Pelvis W Contrast  Result Date: 08/11/2017 CLINICAL DATA:  Generalized acute abdominal pain EXAM: CT ABDOMEN AND PELVIS WITH CONTRAST TECHNIQUE: Multidetector CT imaging of the abdomen and pelvis was performed using the standard protocol following bolus administration of intravenous contrast. CONTRAST:  162mL ISOVUE-300 IOPAMIDOL (ISOVUE-300) INJECTION 61% COMPARISON:  04/17/2017. report from abdominal CT 07/28/2017 at [facility]. FINDINGS: Lower chest: Pericardial fluid that appears low-density. Porta catheter tip extends to the tricuspid valve. Atelectasis in the right more than left lower lobes. Hepatobiliary: Hepatic metastases that have increased from prior, up to 4.7 cm in the upper right liver. The liver is  steatotic.No evidence of biliary obstruction or stone. Pancreas: Unremarkable. Spleen: Unremarkable. Adrenals/Urinary Tract: Negative adrenals. No hydronephrosis or stone. Unremarkable bladder. Stomach/Bowel: Patient has rectal cancer with large rectal mass that is low-density peripherally and enhancing centrally. There is dystrophic calcifications about the mass which may reflect treatment change. The mass appears necrotic and with discrete track extending towards the vagina/perineum. This is known per comparison report. No pneumoperitoneum. There has been interval development of massive ascites with enhancing septations best seen about the liver, which is scalloped. There is diffuse caking of the omentum. Descending colostomy which is bulging due to peritoneal metastases. Diffuse stool in the colon. Negative for small bowel obstruction. Vascular/Lymphatic: Large thrombus in the left femoral and common femoral veins. No acute arterial finding. No mass or adenopathy. Reproductive:No pathologic findings. Other: Ascites noted above. Musculoskeletal: No acute abnormalities. Critical Value/emergent results were called by telephone at the time of interpretation on 08/11/2017 at 10:14 am to Dr. Nanda Quinton , who verbally acknowledged these results. IMPRESSION: 1. Rectal cancer with extensive peritoneal metastatic disease and massive ascites. The abdomen appears tense. Known hepatic metastatic disease. 2. Necrotic primary rectal cancer with visible tract into the vagina/perineum. 3. Extensive DVT in the left femoral and common femoral veins. 4. Anasarca and small pericardial effusion. Electronically Signed   By: Monte Fantasia M.D.   On: 08/11/2017 10:18    EKG: Independently reviewed. Sinus tachycardia   Assessment/Plan Active Problems:   Malignant ascites   Abdominal distention   Hyponatremia   Metastatic rectal cancer -With extensive peritoneal metastatic disease, hepatic metastatic disease, concern for  pericardial metastasis, with ascites -She is awaiting to see Dr. Benay Spice as an outpatient, if he is on call tomorrow I would let him know regarding her admission  Malignant ascites / recent history of spontaneous bacterial peritonitis -Plan for large volume paracentesis, continue ciprofloxacin for known SBP  Recent DVT -Extensive DVT noted even on the CT scan of the left femoral and common femoral veins.  She is already on Eliquis, placed on heparin infusion in case IR would not do paracentesis while on Eliquis  Hyponatremia -She does appear intravascularly dry, I would hold on fluids tonight due to presence of ascites, closely monitor  Sinus tachycardia -We will check a TSH, this was worked up at [facility] without any clear cause but thought to be due to her progressing cancer  Chronic pain -Continue home regimen, she is on a fentanyl patch, morphine, continue bowel regimen as well  Goals of care -She is a full code   DVT prophylaxis: heparin infusion  Code Status: Full code  Family Communication: no family at bedside Disposition Plan: home when ready  Consults called: none     Admission status: Inpatient     Marzetta Board, MD [facility] Pager [phone]  [phone]  If 7PM-7AM, please contact night-coverage [email or web address] Password [email or web address]  08/12/2017, 5:55 PM

## 2017-08-12 NOTE — ED Provider Notes (Signed)
12:00 AM  Pt is a 27 y.o. female with history of stage IV rectal cancer who is currently receiving treatment at [facility] who presents to the emergency department with abdominal distention, pain.  Being treated as an outpatient for SBP.  Diagnostic paracentesis performed which shows no white blood cells and no organisms she does appear to need a therapeutic paracentesis.  She is on anticoagulation.  Last paracentesis over a week ago.  She appears uncomfortable and is tachycardic but not hypotensive or febrile.  Plan was to admit patient to [facility].  Unfortunately given the number of patients in their hospital system they are unable to accept the patient until tomorrow afternoon.  Patient states now that she would like to transfer care to [facility] health system and she has an appointment with oncology with [facility].  Will discuss with hospitalist at [facility] for admission for pain control and scheduling paracentesis with interventional radiology.  12:12 AM Discussed patient's case with hospitalist, Dr. Alcario Drought.  I have recommended admission and patient (and family if present) agree with this plan. Admitting physician will place admission orders.   He agrees that patient needs a therapeutic tap with IR.  He will place bed request orders.  I do not think she needs IV antibiotics it does not appear that she has SBP at this time.  Unfortunately there are no hospital beds within the [facility] health system either.  Patient has been updated and will continue to stay in the ED until bed is available.  I reviewed all nursing notes, vitals, pertinent previous records, EKGs, lab and urine results, imaging (as available).     Ward, Delice Bison, DO 08/12/17 [phone]

## 2017-08-13 ENCOUNTER — Inpatient Hospital Stay (HOSPITAL_COMMUNITY): Payer: Medicaid Other

## 2017-08-13 LAB — COMPREHENSIVE METABOLIC PANEL
ALK PHOS: 139 U/L — AB (ref 38–126)
ALT: 9 U/L — AB (ref 14–54)
AST: 19 U/L (ref 15–41)
Albumin: 1.9 g/dL — ABNORMAL LOW (ref 3.5–5.0)
Anion gap: 8 (ref 5–15)
BUN: 14 mg/dL (ref 6–20)
CO2: 23 mmol/L (ref 22–32)
CREATININE: 0.87 mg/dL (ref 0.44–1.00)
Calcium: 8.9 mg/dL (ref 8.9–10.3)
Chloride: 97 mmol/L — ABNORMAL LOW (ref 101–111)
Glucose, Bld: 106 mg/dL — ABNORMAL HIGH (ref 65–99)
Potassium: 4.9 mmol/L (ref 3.5–5.1)
Sodium: 128 mmol/L — ABNORMAL LOW (ref 135–145)
Total Bilirubin: 0.4 mg/dL (ref 0.3–1.2)
Total Protein: 6.7 g/dL (ref 6.5–8.1)

## 2017-08-13 LAB — BODY FLUID CELL COUNT WITH DIFFERENTIAL
EOS FL: 0 %
LYMPHS FL: 40 %
Monocyte-Macrophage-Serous Fluid: 45 % — ABNORMAL LOW (ref 50–90)
NEUTROPHIL FLUID: 15 % (ref 0–25)
WBC FLUID: 117 uL (ref 0–1000)

## 2017-08-13 LAB — MAGNESIUM: Magnesium: 2.1 mg/dL (ref 1.7–2.4)

## 2017-08-13 LAB — GRAM STAIN

## 2017-08-13 LAB — CBC
HEMATOCRIT: 25.7 % — AB (ref 36.0–46.0)
Hemoglobin: 8.2 g/dL — ABNORMAL LOW (ref 12.0–15.0)
MCH: 26.3 pg (ref 26.0–34.0)
MCHC: 31.9 g/dL (ref 30.0–36.0)
MCV: 82.4 fL (ref 78.0–100.0)
Platelets: 415 10*3/uL — ABNORMAL HIGH (ref 150–400)
RBC: 3.12 MIL/uL — ABNORMAL LOW (ref 3.87–5.11)
RDW: 16.8 % — AB (ref 11.5–15.5)
WBC: 11.8 10*3/uL — ABNORMAL HIGH (ref 4.0–10.5)

## 2017-08-13 LAB — PHOSPHORUS: PHOSPHORUS: 3.9 mg/dL (ref 2.5–4.6)

## 2017-08-13 LAB — HIV ANTIBODY (ROUTINE TESTING W REFLEX): HIV Screen 4th Generation wRfx: NONREACTIVE

## 2017-08-13 LAB — HEPARIN LEVEL (UNFRACTIONATED): Heparin Unfractionated: 2 IU/mL — ABNORMAL HIGH (ref 0.30–0.70)

## 2017-08-13 LAB — APTT
aPTT: 58 seconds — ABNORMAL HIGH (ref 24–36)
aPTT: 55 seconds — ABNORMAL HIGH (ref 24–36)

## 2017-08-13 MED ORDER — HEPARIN (PORCINE) IN NACL 100-0.45 UNIT/ML-% IJ SOLN
1450.0000 [IU]/h | INTRAMUSCULAR | Status: AC
  Administered 2017-08-13 (×2): 1450 [IU]/h via INTRAVENOUS
  Filled 2017-08-13: qty 250

## 2017-08-13 MED ORDER — SODIUM CHLORIDE 0.9 % IV SOLN
INTRAVENOUS | Status: AC
  Administered 2017-08-13: 16:00:00 via INTRAVENOUS

## 2017-08-13 MED ORDER — APIXABAN 5 MG PO TABS
5.0000 mg | ORAL_TABLET | Freq: Two times a day (BID) | ORAL | Status: DC
  Administered 2017-08-13 – 2017-08-14 (×3): 5 mg via ORAL
  Filled 2017-08-13 (×3): qty 1

## 2017-08-13 MED ORDER — HEPARIN (PORCINE) IN NACL 100-0.45 UNIT/ML-% IJ SOLN
1300.0000 [IU]/h | INTRAMUSCULAR | Status: DC
  Administered 2017-08-13: 1300 [IU]/h via INTRAVENOUS

## 2017-08-13 MED ORDER — LIDOCAINE HCL 1 % IJ SOLN
INTRAMUSCULAR | Status: AC
  Filled 2017-08-13: qty 20

## 2017-08-13 MED ORDER — ALBUMIN HUMAN 25 % IV SOLN
25.0000 g | Freq: Once | INTRAVENOUS | Status: AC
  Administered 2017-08-13: 25 g via INTRAVENOUS
  Filled 2017-08-13: qty 100

## 2017-08-13 NOTE — Progress Notes (Signed)
PROGRESS NOTE  Amber Silva IWP:[id number] DOB: April 27, 1991 DOA: 08/11/2017 PCP: [name]., MD   LOS: 1 day   Brief Narrative / Interim history:  27 y.o. 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  She has recently developed malignant ascites with her first paracentesis last week, and at that time she was also diagnosed with SBP and placed on ciprofloxacin.  She was found to have DVT recently and she did not Eliquis since.  There are concerns based on [facility] notes that she has pericardial metastasis.  Assessment & Plan: Active Problems:   Malignant ascites   Abdominal distention   Hyponatremia   Metastatic rectal cancer -With extensive peritoneal metastatic disease, hepatic metastatic disease, concern for pericardial metastasis, with ascites -She is awaiting to see Dr. Benay Spice as an outpatient, he appeared to be on call today on Amion, that was erroneous, he was not the oncologist on call, but appreciate that he returned the page and I was able to discuss with him about Ms Mostek, he will evaluate patient on Monday  Malignant ascites / recent history of spontaneous bacterial peritonitis -Status post large volume paracentesis today 2/9, 5 L were removed.  Due to ongoing hypoalbuminemia as well as liver metastases, will give empirically albumin -Ascitic fluid with 117 WBC, 15 neutrophils, cannot exclude prior SBP since she has been on ciprofloxacin already  Protein calorie malnutrition -Dietary consult  Recent DVT -Extensive DVT noted even on the CT scan of the left femoral and  common femoral veins.  She is already on Eliquis, she was placed on heparin on admission so that can be stopped for paracentesis, resume Eliquis today  Hyponatremia -She does appear intravascularly dry, received large volume repair today, will give albumin, closely monitor -She is eating and drinking well -Serum osmolality, urine osmolality and urine sodium are pending -Gentle hydration 500 cc normal saline today  Sinus tachycardia -TSH slightly elevated.  Her tachycardia was worked up at [facility] without any clear cause but thought to be due to progressive cancer -Obtain FT3 and FT4  Chronic pain -Continue home regimen, she is on a fentanyl patch, morphine, continue bowel regimen as well  Goals of care -She is a full code     DVT prophylaxis: Eliquis Code Status: Full code Family Communication: no family at bedside Disposition Plan: home once sodium improves and she is evaluated by oncology  Consultants:   Oncology - Dr. Benay Spice, will see pt Monday   Procedures:   US guided paracentesis 2/9  Antimicrobials:  Ciprofloxacin 2/8 >>   Subjective: - no chest pain, shortness of breath, no abdominal pain.  Feeling much better after the paracentesis this morning.  Her episode of nausea and vomiting last night but able to eat today  Objective: Vitals:   08/13/17 1023 08/13/17 1026 08/13/17 1035 08/13/17 1040  BP: 111/84 111/79 107/78 104/77  Pulse:      Resp:      Temp:      TempSrc:      SpO2: 98% 99% 99% 98%  Weight:      Height:        Intake/Output Summary (Last 24 hours) at  08/13/2017 1432 Last data filed at 08/13/2017 0830 Gross per 24 hour  Intake 82.8 ml  Output 300 ml  Net -217.2 ml   Filed Weights   08/11/17 0753 08/12/17 1700  Weight: 63.5 kg (140 lb) 67.7 kg (149 lb 4 oz)    Examination:  Constitutional: NAD Eyes:  lids and conjunctivae normal ENMT: Mucous membranes are moist.  Neck: normal, supple Respiratory: clear to auscultation  bilaterally, no wheezing, no crackles. Normal respiratory effort. No accessory muscle use.  Cardiovascular: Regular rate and rhythm, no murmurs / rubs / gallops.  Tachycardic no LE edema.  Abdomen: Soft, nondistended (after paracentesis), ostomy bag in place, bowel sounds positive Skin: no rashes Neurologic: non -focal Psychiatric: Normal judgment and insight. Alert and oriented x 3. Normal mood.    Data Reviewed: I have independently reviewed following labs and imaging studies   CBC: Recent Labs  Lab 08/11/17 0850 08/13/17 0350  WBC 9.5 11.8*  NEUTROABS 8.8*  --   HGB 9.1* 8.2*  HCT 28.5* 25.7*  MCV 81.9 82.4  PLT 547* 277*   Basic Metabolic Panel: Recent Labs  Lab 08/11/17 0850 08/13/17 0350  NA 128* 128*  K 5.0 4.9  CL 92* 97*  CO2 23 23  GLUCOSE 105* 106*  BUN 16 14  CREATININE 0.93 0.87  CALCIUM 9.2 8.9  MG  --  2.1  PHOS  --  3.9   GFR: Estimated Creatinine Clearance: 89.7 mL/min (by C-G formula based on SCr of 0.87 mg/dL). Liver Function Tests: Recent Labs  Lab 08/11/17 0850 08/13/17 0350  AST 23 19  ALT 8* 9*  ALKPHOS 131* 139*  BILITOT 0.7 0.4  PROT 7.4 6.7  ALBUMIN 2.1* 1.9*   No results for input(s): LIPASE, AMYLASE in the last 168 hours. No results for input(s): AMMONIA in the last 168 hours. Coagulation Profile: Recent Labs  Lab 08/11/17 0850  INR 1.52   Cardiac Enzymes: No results for input(s): CKTOTAL, CKMB, CKMBINDEX, TROPONINI in the last 168 hours. BNP (last 3 results) No results for input(s): PROBNP in the last 8760 hours. HbA1C: No results for input(s): HGBA1C in the last 72 hours. CBG: Recent Labs  Lab 08/11/17 1126  GLUCAP 111*   Lipid Profile: No results for input(s): CHOL, HDL, LDLCALC, TRIG, CHOLHDL, LDLDIRECT in the last 72 hours. Thyroid Function Tests: Recent Labs    08/12/17 1827  TSH 8.362*   Anemia Panel: No results for input(s): VITAMINB12, FOLATE, FERRITIN, TIBC, IRON, RETICCTPCT in the last 72  hours. Urine analysis:    Component Value Date/Time   COLORURINE YELLOW 04/17/2017 1611   APPEARANCEUR CLEAR 04/17/2017 1611   LABSPEC 1.010 04/17/2017 1611   PHURINE 7.0 04/17/2017 1611   GLUCOSEU NEGATIVE 04/17/2017 1611   HGBUR NEGATIVE 04/17/2017 1611   BILIRUBINUR NEGATIVE 04/17/2017 1611   KETONESUR NEGATIVE 04/17/2017 1611   PROTEINUR NEGATIVE 04/17/2017 1611   UROBILINOGEN >8.0 (H) 02/11/2014 1125   NITRITE NEGATIVE 04/17/2017 1611   LEUKOCYTESUR NEGATIVE 04/17/2017 1611   Sepsis Labs: Invalid input(s): PROCALCITONIN, LACTICIDVEN  Recent Results (from the past 240 hour(s))  Blood Culture (routine x 2)     Status: None (Preliminary result)   Collection Time: 08/11/17  8:45 AM  Result Value Ref Range Status   Specimen Description   Final    BLOOD RIGHT ANTECUBITAL Performed at [facility], [address] [place], [place] [address]    Special Requests   Final    BOTTLES DRAWN AEROBIC AND ANAEROBIC Blood  Culture adequate volume Performed at [facility], [address] [place], [place] [address]    Culture   Final    NO GROWTH 2 DAYS Performed at [facility], [place] [address], [place], [place] [address]    Report Status PENDING  Incomplete  Blood Culture (routine x 2)     Status: None (Preliminary result)   Collection Time: 08/11/17  8:55 AM  Result Value Ref Range Status   Specimen Description   Final    BLOOD LEFT ANTECUBITAL Performed at [facility], [address] [place], [place] [address]    Special Requests   Final    BOTTLES DRAWN AEROBIC AND ANAEROBIC Blood Culture adequate volume Performed at [facility], [address] [place], [place] [address]    Culture   Final    NO GROWTH 2 DAYS Performed at [facility], [place] [address] [place], [place] [address]    Report Status PENDING  Incomplete  Culture, body fluid-bottle     Status: None (Preliminary result)   Collection Time: 08/11/17 10:35  AM  Result Value Ref Range Status   Specimen Description ASCITIC  Final   Special Requests NONE  Final   Culture   Final    NO GROWTH 2 DAYS Performed at [place] [address] [place], [place] [address]    Report Status PENDING  Incomplete  Gram stain     Status: None   Collection Time: 08/11/17 10:35 AM  Result Value Ref Range Status   Specimen Description ASCITIC  Final   Special Requests NONE  Final   Gram Stain   Final    NO WBC SEEN NO ORGANISMS SEEN Performed at [facility], [place] [address], [place], [place] [address]    Report Status 08/11/2017 FINAL  Final      Radiology Studies: Dg Chest 2 View  Result Date: 08/12/2017 CLINICAL DATA:  Stage IV adenocarcinoma of the rectum. EXAM: CHEST  2 VIEW COMPARISON:  July 06, 2017 FINDINGS: The study is limited due to low lung volumes. A double lumen port terminates near the caval atrial junction. The heart, hila, mediastinum are normal. No pneumothorax. No other acute abnormalities. IMPRESSION: No acute abnormality identified.  Low lung volumes limit evaluation. Electronically Signed   By: Dorise Bullion III M.D   On: 08/12/2017 19:43   US Paracentesis  Result Date: 08/13/2017 INDICATION: Metastatic rectal cancer. Recurrent malignant ascites. Request for diagnostic and therapeutic paracentesis. EXAM: ULTRASOUND GUIDED RIGHT LATERAL ABDOMEN PARACENTESIS MEDICATIONS: 1% Lidocaine = 10 mL COMPLICATIONS: None immediate. PROCEDURE: Informed written consent was obtained from the patient after a discussion of the risks, benefits and alternatives to treatment. A timeout was performed prior to the initiation of the procedure. Initial ultrasound scanning demonstrates a large amount of ascites within the right lower abdominal quadrant. The right lower abdomen was prepped and draped in the usual sterile fashion. 1% lidocaine with epinephrine was used for local anesthesia. Following this, a 19 gauge, 7-cm, Yueh catheter was  introduced. An ultrasound image was saved for documentation purposes. The paracentesis was performed. The catheter was removed and a dressing was applied. The patient tolerated the procedure well without immediate post procedural complication. FINDINGS: A total of approximately 5 liters of clear red fluid was removed. Samples were sent to the laboratory as requested by the clinical team. IMPRESSION: Successful ultrasound-guided paracentesis yielding 5 liters of peritoneal fluid. Read  by: Gareth Eagle, PA-C Electronically Signed   By: Aletta Edouard M.D.   On: 08/13/2017 10:54     Scheduled Meds: . ciprofloxacin  500 mg Oral BID  . fentaNYL  50 mcg Transdermal Q72H  . lidocaine      . polyethylene glycol  17 g Oral Daily   Continuous Infusions: . albumin human    . heparin 1,300 Units/hr (08/13/17 0458)     Marzetta Board, MD, PhD [facility] Pager [phone]  If 7PM-7AM, please contact night-coverage [email or web address] Password [email or web address] 08/13/2017, 2:32 PM

## 2017-08-13 NOTE — Progress Notes (Signed)
ANTICOAGULATION CONSULT NOTE - f/u Consult  Pharmacy Consult for heparin Indication: DVT (diagnosed during 1/24 admission at [facility])  No Known Allergies  Patient Measurements: Height: 5\' 3"  (160 cm) Weight: 149 lb 4 oz (67.7 kg) IBW/kg (Calculated) : 52.4 Heparin Dosing Weight: 66.2 kg  Vital Signs: Temp: 97.3 F (36.3 C) (02/08 2141) Temp Source: Oral (02/08 2141) BP: 123/100 (02/08 2141) Pulse Rate: 133 (02/08 2141)  Labs: Recent Labs    08/11/17 0850 08/12/17 1827 08/13/17 0350  HGB 9.1*  --  8.2*  HCT 28.5*  --  25.7*  PLT 547*  --  415*  APTT 48* 37* 58*  LABPROT 18.2*  --   --   INR 1.52  --   --   HEPARINUNFRC  --  >2.20*  --   CREATININE 0.93  --   --     Estimated Creatinine Clearance: 83.9 mL/min (by C-G formula based on SCr of 0.93 mg/dL).   Medical History: Past Medical History:  Diagnosis Date  . Ascites   . Bronchitis   . Colon cancer ([place])   . Pregnancy induced hypertension   . S/P colostomy ([place])     Assessment: 10 YOF presents with abdominal pain and swelling.  Patient with stage IV rectal cancer.  She was found to have DVT during her 1/24 admission at [facility].  She was discharged on apixaban. She was taking 5mg  PO BID and with last dose documented being taken 2/6 at 2000.   Baseline anti-Xa level: > 2.20 APTT = 37 sec  2/8  CBC: Hgb decreased but consistent with 07/06/17 value, pltc elevated  APTT and heparin level pending  apixiban elevates anti-Xa level (aka heparin level) so must use aPTT to monitor heparin gtt until apixaban washed out  Plans for paracentesis by IR 2/9 Today, 2/9  0350 aPtt = 58 sec below goal, no infusion or bleeding issues per RN.   Goal of Therapy:  Heparin level 0.3-0.7 units/ml aPTT 66-102 sec seconds Monitor platelets by anticoagulation protocol: Yes   Plan:   Increase heparin drip to 1300 units/hr  Check 6h aPTT - use aPTT to dose until anti-Xa level and aPTT correlate  Daily CBC, heparin level  No  stop time for paracentesis at this time, discussed with TRH   Dorrene German 08/13/2017 4:52 AM

## 2017-08-13 NOTE — Progress Notes (Signed)
ANTICOAGULATION CONSULT NOTE - Follow Up Consult  Pharmacy Consult for Heparin Indication: DVT  No Known Allergies  Patient Measurements: Height: 5\' 3"  (160 cm) Weight: 149 lb 4 oz (67.7 kg) IBW/kg (Calculated) : 52.4 Heparin Dosing Weight: 66.2 kg  Vital Signs: Temp: 98 F (36.7 C) (02/09 0506) Temp Source: Oral (02/09 0506) BP: 131/96 (02/09 0506) Pulse Rate: 123 (02/09 0506)  Labs: Recent Labs    08/11/17 0850 08/12/17 1827 08/13/17 0350  HGB 9.1*  --  8.2*  HCT 28.5*  --  25.7*  PLT 547*  --  415*  APTT 48* 37* 58*  LABPROT 18.2*  --   --   INR 1.52  --   --   HEPARINUNFRC  --  >2.20*  --   CREATININE 0.93  --  0.87    Estimated Creatinine Clearance: 89.7 mL/min (by C-G formula based on SCr of 0.87 mg/dL).   Medications:  Infusions:  . heparin 1,300 Units/hr (08/13/17 0458)    Assessment: 21 YOF presents with abdominal pain and swelling.  Patient with stage IV rectal cancer.  She was found to have DVT during her 1/24 admission at [facility] and discharged on apixaban.  Pharmacy is consulted to dose Heparin while apixaban is on hold for planned procedures.  Home apixaban dosing 5mg  PO BID; last dose documented being taken 2/6 at 2000.  Baseline anti-Xa level: > 2.20, APTT = 37 sec  Today, 08/13/2017:  Heparin level 2, SUPRAtherapeutic  Apixiban elevates anti-Xa level (aka heparin level) so must use aPTT to monitor heparin gtt until apixaban washed out  APTT 55, SUBtherapeutic  CBC: Hgb 8.2 is decreased (consistent with 07/06/17 value), Plt elevated  No bleeding or complications reported  Paracentesis scheduled in IR on 2/9.  RN states that she was instructed not to stop the heparin infusion prior to the procedure.  Time is TBD.  2/9 10:55 Successful US guided paracentesis from right lateral abdomen. Yielded 5 liters of clear red fluid. No immediate complications.    Goal of Therapy:  Heparin level 0.3-0.7 units/ml aPTT 66-102 seconds Monitor platelets by  anticoagulation protocol: Yes   Plan:   Increase to heparin IV infusion at 1450 units/hr  APTT and Heparin level 6 hours after starting  Daily heparin level and CBC  Continue to monitor H&H and platelets   Gretta Arab PharmD, BCPS Pager [phone] 08/13/2017 9:25 AM

## 2017-08-13 NOTE — Progress Notes (Signed)
ANTICOAGULATION CONSULT NOTE - Follow Up Consult  Pharmacy Consult for Eliquis Indication: DVT  No Known Allergies  Patient Measurements: Height: 5\' 3"  (160 cm) Weight: 149 lb 4 oz (67.7 kg) IBW/kg (Calculated) : 52.4 Heparin Dosing Weight: 66.2 kg  Vital Signs: Temp: 98 F (36.7 C) (02/09 0506) Temp Source: Oral (02/09 0506) BP: 104/77 (02/09 1040) Pulse Rate: 123 (02/09 0506)  Labs: Recent Labs    08/11/17 0850 08/12/17 1827 08/13/17 0350 08/13/17 1123 08/13/17 1400  HGB 9.1*  --  8.2*  --   --   HCT 28.5*  --  25.7*  --   --   PLT 547*  --  415*  --   --   APTT 48* 37* 58*  --  55*  LABPROT 18.2*  --   --   --   --   INR 1.52  --   --   --   --   HEPARINUNFRC  --  >2.20*  --  2.00*  --   CREATININE 0.93  --  0.87  --   --     Estimated Creatinine Clearance: 89.7 mL/min (by C-G formula based on SCr of 0.87 mg/dL).   Medications:  Infusions:  . sodium chloride    . albumin human    . heparin      Assessment: 26 YOF presents with abdominal pain and swelling.  Patient with stage IV rectal cancer.  She was found to have DVT during her 1/24 admission at [facility] and discharged on apixaban.  Pharmacy was consulted to dose Heparin while apixaban is on hold for planned procedures.  Pharmacy is now consulted to resume apixaban.  Home apixaban dosing 5mg  PO BID; last dose documented being taken 2/6 at 2000.  Baseline anti-Xa level: > 2.20, APTT = 37 sec  Today, 08/13/2017:  Most recent APTT 55, SUBtherapeutic  CBC: Hgb 8.2 is decreased (consistent with 07/06/17 value), Plt elevated  No bleeding or complications reported  Paracentesis scheduled in IR on 2/9.  RN states that she was instructed not to stop the heparin infusion prior to the procedure.  Time is TBD.  2/9 10:55 Successful US guided paracentesis from right lateral abdomen. Yielded 5 liters of clear red fluid. No immediate complications.   Goal of Therapy:  Heparin level 0.3-0.7 units/ml aPTT 66-102  seconds Monitor platelets by anticoagulation protocol: Yes   Plan:   Continue heparin IV infusion at 1450 units/hr until 20:00  At 20:00 resume Eliquis 5mg  PO BID  Monitor for bleeding, complications.   Gretta Arab PharmD, BCPS Pager [phone] 08/13/2017 2:53 PM

## 2017-08-13 NOTE — Procedures (Signed)
PROCEDURE SUMMARY:  Successful US guided paracentesis from right lateral abdomen.  Yielded 5 liters of clear red fluid.  No immediate complications.  Pt tolerated well.   Specimen was sent for labs.  Jozsef Wescoat S Hrishikesh Hoeg PA-C 08/13/2017 10:55 AM

## 2017-08-14 LAB — T4, FREE: FREE T4: 1.25 ng/dL — AB (ref 0.61–1.12)

## 2017-08-14 LAB — CBC
HCT: 21.9 % — ABNORMAL LOW (ref 36.0–46.0)
Hemoglobin: 7.1 g/dL — ABNORMAL LOW (ref 12.0–15.0)
MCH: 26.5 pg (ref 26.0–34.0)
MCHC: 32.4 g/dL (ref 30.0–36.0)
MCV: 81.7 fL (ref 78.0–100.0)
PLATELETS: 285 10*3/uL (ref 150–400)
RBC: 2.68 MIL/uL — AB (ref 3.87–5.11)
RDW: 16.8 % — ABNORMAL HIGH (ref 11.5–15.5)
WBC: 9.3 10*3/uL (ref 4.0–10.5)

## 2017-08-14 LAB — ALBUMIN, PLEURAL OR PERITONEAL FLUID: ALBUMIN FL: 1.4 g/dL

## 2017-08-14 LAB — COMPREHENSIVE METABOLIC PANEL
ALBUMIN: 1.9 g/dL — AB (ref 3.5–5.0)
ALT: 8 U/L — ABNORMAL LOW (ref 14–54)
AST: 22 U/L (ref 15–41)
Alkaline Phosphatase: 108 U/L (ref 38–126)
Anion gap: 6 (ref 5–15)
BUN: 10 mg/dL (ref 6–20)
CHLORIDE: 97 mmol/L — AB (ref 101–111)
CO2: 25 mmol/L (ref 22–32)
Calcium: 8.2 mg/dL — ABNORMAL LOW (ref 8.9–10.3)
Creatinine, Ser: 0.64 mg/dL (ref 0.44–1.00)
GFR calc Af Amer: >60 mL/min (ref >60)
GFR calc non Af Amer: >60 mL/min (ref >60)
GLUCOSE: 99 mg/dL (ref 65–99)
Potassium: 4.3 mmol/L (ref 3.5–5.1)
SODIUM: 128 mmol/L — AB (ref 135–145)
Total Bilirubin: 0.2 mg/dL — ABNORMAL LOW (ref 0.3–1.2)
Total Protein: 5.6 g/dL — ABNORMAL LOW (ref 6.5–8.1)

## 2017-08-14 LAB — SODIUM, URINE, RANDOM: Sodium, Ur: <10 mmol/L

## 2017-08-14 LAB — OSMOLALITY, URINE: Osmolality, Ur: 830 mOsm/kg (ref 300–900)

## 2017-08-14 LAB — OSMOLALITY: OSMOLALITY: 267 mosm/kg — AB (ref 275–295)

## 2017-08-14 LAB — MAGNESIUM: MAGNESIUM: 1.9 mg/dL (ref 1.7–2.4)

## 2017-08-14 LAB — PHOSPHORUS: Phosphorus: 3 mg/dL (ref 2.5–4.6)

## 2017-08-14 MED ORDER — HYDROMORPHONE HCL 1 MG/ML IJ SOLN
1.0000 mg | INTRAMUSCULAR | Status: DC | PRN
  Administered 2017-08-14 – 2017-08-16 (×14): 2 mg via INTRAVENOUS
  Administered 2017-08-16: 1 mg via INTRAVENOUS
  Administered 2017-08-16 (×4): 2 mg via INTRAVENOUS
  Administered 2017-08-16: 1 mg via INTRAVENOUS
  Administered 2017-08-17 (×3): 2 mg via INTRAVENOUS
  Filled 2017-08-14: qty 1
  Filled 2017-08-14 (×23): qty 2

## 2017-08-14 MED ORDER — FENTANYL 75 MCG/HR TD PT72
75.0000 ug | MEDICATED_PATCH | TRANSDERMAL | Status: DC
  Administered 2017-08-14 – 2017-08-17 (×2): 75 ug via TRANSDERMAL
  Filled 2017-08-14 (×2): qty 1

## 2017-08-14 NOTE — Progress Notes (Signed)
[facility]                                                                              Patient Demographics  Amber Silva, is a 27 y.o. female, DOB - 1991/02/22, CBJ:[id number]  Admit date - 08/11/2017   Admitting Physician Etta Quill, DO  Outpatient Primary MD for the patient is [name]., MD  Outpatient specialists:   LOS - 2  days   Medical records reviewed and are as summarized below:    Chief Complaint  Patient presents with  . Abdominal Pain       Brief summary   27 y.o.femalewith medical history significant ofstage IV adenocarcinoma of the rectum status post sigmoid colostomy, chemotherapy, palliative radiation therapy, cancer metastasized tothe liverand peritoneal metastatic disease, with massive malignant ascites, who gets all of her care at [facility] and is looking for a second opinion here at [facility] health, presents to the emergency room with progressive abdominal distention and discomfort.  She has recently developed malignant ascites with her first paracentesis last week, and at that time she was also diagnosed with SBP and placed on ciprofloxacin.  She was found to have DVT recently and she did not Eliquis since.  There are concerns based on [facility] notes that she has pericardial metastasis.  Assessment & Plan     Metastatic rectal cancer -With extensive peritoneal metastatic disease, hepatic metastatic disease, concern for pericardial metastasis, with ascites -Awaiting to see Dr. Learta Codding for second opinion, will evaluate on 2/11    Malignant ascites/recent history of spontaneous bacterial peritonitis -Status post large volume paracentesis on 2/9, 5 L removed.  Empiric albumin given.   -Ascitic fluid with 117 WBC, 15 neutrophils, cannot exclude prior SBP since she has been on ciprofloxacin already  Protein calorie malnutrition -Nutrition consult  Recent DVT -Extensive DVT noted even on the CT scan of  the left femoral and common femoral veins. -Eliquis resumed, was placed on hold for paracentesis.   Hyponatremia -Currently eating and drinking well, sodium stable -Serum osmolarity 267, urine osmolality 830   Sinus tachycardia -TSH slightly elevated.  Her tachycardia was worked up at [facility] without any clear cause but thought to be due to progressive cancer -Free T4 1.25  Chronic pain -Continue home regimen, she is on a fentanyl patch, morphine, continue bowel regimen as well  Code Status: full  DVT Prophylaxis:  apixaban Family Communication: Discussed in detail with the patient, all imaging results, lab results explained to the patient   Disposition Plan: Possibly in a.m., awaiting Dr. Carin Hock recommendations.  Will benefit from hospice at home given poor prognosis and progressively worsening symptoms.  Time Spent in minutes  25 minutes  Procedures:  Ultrasound-guided paracentesis 2/9  Consultants:     Antimicrobials:   Ciprofloxacin 2/8>   Medications  Scheduled Meds: . apixaban  5 mg Oral BID  . ciprofloxacin  500 mg Oral BID  . fentaNYL  50 mcg Transdermal Q72H  . polyethylene glycol  17 g Oral Daily   Continuous Infusions: PRN Meds:.HYDROmorphone (DILAUDID) injection, morphine, ondansetron (ZOFRAN) IV, promethazine, senna-docusate, sodium chloride flush  Antibiotics   Anti-infectives (From admission, onward)   Start     Dose/Rate Route Frequency Ordered Stop   08/12/17 2000  ciprofloxacin (CIPRO) tablet 500 mg     500 mg Oral 2 times daily 08/12/17 1800          Subjective:   Amber Silva was seen and examined today.  Currently no significant complaints, pain is controlled.  Feels better after paracentesis yesterday.  No nausea vomiting.  Patient denies dizziness, chest pain, shortness of breath.  No fevers.  Objective:   Vitals:   08/13/17 1530 08/13/17 1650 08/13/17 2120 08/14/17 0553  BP: 112/77 122/78 (!) 104/59 115/68  Pulse:  (!) 119 (!) 120 (!) 113 (!) 128  Resp: 20  16 18   Temp: 97.6 F (36.4 C) 98.1 F (36.7 C) 99.2 F (37.3 C) 99.2 F (37.3 C)  TempSrc: Oral Oral Oral Oral  SpO2: 100% 100% 98% 97%  Weight:    66.2 kg (145 lb 15.1 oz)  Height:        Intake/Output Summary (Last 24 hours) at 08/14/2017 1303 Last data filed at 08/14/2017 1100 Gross per 24 hour  Intake 1044.56 ml  Output 500 ml  Net 544.56 ml     Wt Readings from Last 3 Encounters:  08/14/17 66.2 kg (145 lb 15.1 oz)  07/06/17 59.4 kg (131 lb)  04/17/17 64 kg (141 lb)     Exam  General: Alert and oriented x 3, NAD  Eyes:   HEENT:    Cardiovascular: S1 S2 auscultated, Regular rate and rhythm. Port+  Respiratory: Clear to auscultation bilaterally, no wheezing, rales or rhonchi  Gastrointestinal: Soft, nontender, distended, + bowel sounds, ostomy bag +  Ext: no pedal edema bilaterally  Neuro: no new deficits  Musculoskeletal: No digital cyanosis, clubbing  Skin: No rashes  Psych: Normal affect and demeanor, alert and oriented x3    Data Reviewed:  I have personally reviewed following labs and imaging studies  Micro Results Recent Results (from the past 240 hour(s))  Blood Culture (routine x 2)     Status: None (Preliminary result)   Collection Time: 08/11/17  8:45 AM  Result Value Ref Range Status   Specimen Description   Final    BLOOD RIGHT ANTECUBITAL Performed at [facility], [address] [place], [place] [address]    Special Requests   Final    BOTTLES DRAWN AEROBIC AND ANAEROBIC Blood Culture adequate volume Performed at [facility], [address], [place], [place] [address]    Culture   Final    NO GROWTH 3 DAYS Performed at [facility], [address] [place], [place] [address]    Report Status PENDING  Incomplete  Blood Culture (routine x 2)     Status: None (Preliminary result)   Collection Time: 08/11/17  8:55 AM  Result Value Ref Range Status   Specimen  Description   Final    BLOOD LEFT ANTECUBITAL Performed at [facility], [address] [place], [place] [address]    Special Requests   Final    BOTTLES DRAWN AEROBIC AND ANAEROBIC Blood Culture adequate volume Performed at [facility], [address] [place], [place] [address]    Culture   Final    NO GROWTH 3 DAYS Performed at [facility], [place] [address] [place], [place] [address]    Report Status PENDING  Incomplete  Culture, body fluid-bottle  Status: None (Preliminary result)   Collection Time: 08/11/17 10:35 AM  Result Value Ref Range Status   Specimen Description ASCITIC  Final   Special Requests NONE  Final   Culture   Final    NO GROWTH 3 DAYS Performed at [facility], [address] [place], [place] [address]    Report Status PENDING  Incomplete  Gram stain     Status: None   Collection Time: 08/11/17 10:35 AM  Result Value Ref Range Status   Specimen Description ASCITIC  Final   Special Requests NONE  Final   Gram Stain   Final    NO WBC SEEN NO ORGANISMS SEEN Performed at [facility], [place] [address] [place], [place] [address]    Report Status 08/11/2017 FINAL  Final  Culture, body fluid-bottle     Status: None (Preliminary result)   Collection Time: 08/13/17 10:15 AM  Result Value Ref Range Status   Specimen Description PERITONEAL  Final   Special Requests NONE  Final   Culture   Final    NO GROWTH < 24 HOURS Performed at [facility], [place] [address], [place], [place] [address]    Report Status PENDING  Incomplete  Gram stain     Status: None   Collection Time: 08/13/17 10:15 AM  Result Value Ref Range Status   Specimen Description PERITONEAL  Final   Special Requests NONE  Final   Gram Stain   Final    FEW WBC PRESENT,BOTH PMN AND MONONUCLEAR NO ORGANISMS SEEN Performed at [facility], [address] [place],  [address]    Report Status 08/13/2017 FINAL  Final    Radiology  Reports Dg Chest 2 View  Result Date: 08/12/2017 CLINICAL DATA:  Stage IV adenocarcinoma of the rectum. EXAM: CHEST  2 VIEW COMPARISON:  July 06, 2017 FINDINGS: The study is limited due to low lung volumes. A double lumen port terminates near the caval atrial junction. The heart, hila, mediastinum are normal. No pneumothorax. No other acute abnormalities. IMPRESSION: No acute abnormality identified.  Low lung volumes limit evaluation. Electronically Signed   By: Dorise Bullion III M.D   On: 08/12/2017 19:43   Ct Abdomen Pelvis W Contrast  Result Date: 08/11/2017 CLINICAL DATA:  Generalized acute abdominal pain EXAM: CT ABDOMEN AND PELVIS WITH CONTRAST TECHNIQUE: Multidetector CT imaging of the abdomen and pelvis was performed using the standard protocol following bolus administration of intravenous contrast. CONTRAST:  142mL ISOVUE-300 IOPAMIDOL (ISOVUE-300) INJECTION 61% COMPARISON:  04/17/2017. report from abdominal CT 07/28/2017 at [facility]. FINDINGS: Lower chest: Pericardial fluid that appears low-density. Porta catheter tip extends to the tricuspid valve. Atelectasis in the right more than left lower lobes. Hepatobiliary: Hepatic metastases that have increased from prior, up to 4.7 cm in the upper right liver. The liver is steatotic.No evidence of biliary obstruction or stone. Pancreas: Unremarkable. Spleen: Unremarkable. Adrenals/Urinary Tract: Negative adrenals. No hydronephrosis or stone. Unremarkable bladder. Stomach/Bowel: Patient has rectal cancer with large rectal mass that is low-density peripherally and enhancing centrally. There is dystrophic calcifications about the mass which may reflect treatment change. The mass appears necrotic and with discrete track extending towards the vagina/perineum. This is known per comparison report. No pneumoperitoneum. There has been interval development of massive ascites with enhancing septations best seen about the liver, which is scalloped. There is  diffuse caking of the omentum. Descending colostomy which is bulging due to peritoneal metastases. Diffuse stool in the  colon. Negative for small bowel obstruction. Vascular/Lymphatic: Large thrombus in the left femoral and common femoral veins. No acute arterial finding. No mass or adenopathy. Reproductive:No pathologic findings. Other: Ascites noted above. Musculoskeletal: No acute abnormalities. Critical Value/emergent results were called by telephone at the time of interpretation on 08/11/2017 at 10:14 am to Dr. Nanda Quinton , who verbally acknowledged these results. IMPRESSION: 1. Rectal cancer with extensive peritoneal metastatic disease and massive ascites. The abdomen appears tense. Known hepatic metastatic disease. 2. Necrotic primary rectal cancer with visible tract into the vagina/perineum. 3. Extensive DVT in the left femoral and common femoral veins. 4. Anasarca and small pericardial effusion. Electronically Signed   By: Monte Fantasia M.D.   On: 08/11/2017 10:18   US Paracentesis  Result Date: 08/13/2017 INDICATION: Metastatic rectal cancer. Recurrent malignant ascites. Request for diagnostic and therapeutic paracentesis. EXAM: ULTRASOUND GUIDED RIGHT LATERAL ABDOMEN PARACENTESIS MEDICATIONS: 1% Lidocaine = 10 mL COMPLICATIONS: None immediate. PROCEDURE: Informed written consent was obtained from the patient after a discussion of the risks, benefits and alternatives to treatment. A timeout was performed prior to the initiation of the procedure. Initial ultrasound scanning demonstrates a large amount of ascites within the right lower abdominal quadrant. The right lower abdomen was prepped and draped in the usual sterile fashion. 1% lidocaine with epinephrine was used for local anesthesia. Following this, a 19 gauge, 7-cm, Yueh catheter was introduced. An ultrasound image was saved for documentation purposes. The paracentesis was performed. The catheter was removed and a dressing was applied. The  patient tolerated the procedure well without immediate post procedural complication. FINDINGS: A total of approximately 5 liters of clear red fluid was removed. Samples were sent to the laboratory as requested by the clinical team. IMPRESSION: Successful ultrasound-guided paracentesis yielding 5 liters of peritoneal fluid. Read by: Gareth Eagle, PA-C Electronically Signed   By: Aletta Edouard M.D.   On: 08/13/2017 10:54    Lab Data:  CBC: Recent Labs  Lab 08/11/17 0850 08/13/17 0350 08/14/17 0409  WBC 9.5 11.8* 9.3  NEUTROABS 8.8*  --   --   HGB 9.1* 8.2* 7.1*  HCT 28.5* 25.7* 21.9*  MCV 81.9 82.4 81.7  PLT 547* 415* 161   Basic Metabolic Panel: Recent Labs  Lab 08/11/17 0850 08/13/17 0350 08/14/17 0409  NA 128* 128* 128*  K 5.0 4.9 4.3  CL 92* 97* 97*  CO2 23 23 25   GLUCOSE 105* 106* 99  BUN 16 14 10   CREATININE 0.93 0.87 0.64  CALCIUM 9.2 8.9 8.2*  MG  --  2.1 1.9  PHOS  --  3.9 3.0   GFR: Estimated Creatinine Clearance: 96.6 mL/min (by C-G formula based on SCr of 0.64 mg/dL). Liver Function Tests: Recent Labs  Lab 08/11/17 0850 08/13/17 0350 08/14/17 0409  AST 23 19 22   ALT 8* 9* 8*  ALKPHOS 131* 139* 108  BILITOT 0.7 0.4 0.2*  PROT 7.4 6.7 5.6*  ALBUMIN 2.1* 1.9* 1.9*   No results for input(s): LIPASE, AMYLASE in the last 168 hours. No results for input(s): AMMONIA in the last 168 hours. Coagulation Profile: Recent Labs  Lab 08/11/17 0850  INR 1.52   Cardiac Enzymes: No results for input(s): CKTOTAL, CKMB, CKMBINDEX, TROPONINI in the last 168 hours. BNP (last 3 results) No results for input(s): PROBNP in the last 8760 hours. HbA1C: No results for input(s): HGBA1C in the last 72 hours. CBG: Recent Labs  Lab 08/11/17 1126  GLUCAP 111*   Lipid Profile: No results for  input(s): CHOL, HDL, LDLCALC, TRIG, CHOLHDL, LDLDIRECT in the last 72 hours. Thyroid Function Tests: Recent Labs    08/12/17 1827 08/14/17 0409  TSH 8.362*  --   FREET4  --   1.25*   Anemia Panel: No results for input(s): VITAMINB12, FOLATE, FERRITIN, TIBC, IRON, RETICCTPCT in the last 72 hours. Urine analysis:    Component Value Date/Time   COLORURINE YELLOW 04/17/2017 1611   APPEARANCEUR CLEAR 04/17/2017 1611   LABSPEC 1.010 04/17/2017 1611   PHURINE 7.0 04/17/2017 1611   GLUCOSEU NEGATIVE 04/17/2017 1611   HGBUR NEGATIVE 04/17/2017 1611   BILIRUBINUR NEGATIVE 04/17/2017 1611   KETONESUR NEGATIVE 04/17/2017 1611   PROTEINUR NEGATIVE 04/17/2017 1611   UROBILINOGEN >8.0 (H) 02/11/2014 1125   NITRITE NEGATIVE 04/17/2017 1611   LEUKOCYTESUR NEGATIVE 04/17/2017 1611     Ripudeep Rai M.D. [facility] 08/14/2017, 1:03 PM  Pager: [phone] Between 7am to 7pm - call Pager - [phone]  After 7pm go to [email or web address] - password [email or web address]  Call night coverage person covering after 7pm

## 2017-08-15 ENCOUNTER — Inpatient Hospital Stay (HOSPITAL_COMMUNITY): Payer: Medicaid Other

## 2017-08-15 DIAGNOSIS — E440 Moderate protein-calorie malnutrition: Secondary | ICD-10-CM

## 2017-08-15 LAB — CBC
HCT: 24.6 % — ABNORMAL LOW (ref 36.0–46.0)
HEMOGLOBIN: 7.9 g/dL — AB (ref 12.0–15.0)
MCH: 26.5 pg (ref 26.0–34.0)
MCHC: 32.1 g/dL (ref 30.0–36.0)
MCV: 82.6 fL (ref 78.0–100.0)
Platelets: 291 10*3/uL (ref 150–400)
RBC: 2.98 MIL/uL — ABNORMAL LOW (ref 3.87–5.11)
RDW: 16.7 % — ABNORMAL HIGH (ref 11.5–15.5)
WBC: 10.5 10*3/uL (ref 4.0–10.5)

## 2017-08-15 LAB — T3, FREE: T3, Free: 2.1 pg/mL (ref 2.0–4.4)

## 2017-08-15 MED ORDER — LIDOCAINE HCL 1 % IJ SOLN
INTRAMUSCULAR | Status: AC
  Filled 2017-08-15: qty 10

## 2017-08-15 MED ORDER — POLYETHYLENE GLYCOL 3350 17 G PO PACK
17.0000 g | PACK | Freq: Two times a day (BID) | ORAL | Status: DC
  Administered 2017-08-15 – 2017-08-23 (×9): 17 g via ORAL
  Filled 2017-08-15 (×15): qty 1

## 2017-08-15 MED ORDER — ENSURE ENLIVE PO LIQD
237.0000 mL | Freq: Two times a day (BID) | ORAL | Status: DC
  Administered 2017-08-15 – 2017-08-22 (×7): 237 mL via ORAL

## 2017-08-15 MED ORDER — APIXABAN 5 MG PO TABS
5.0000 mg | ORAL_TABLET | Freq: Two times a day (BID) | ORAL | Status: DC
  Administered 2017-08-15 – 2017-08-23 (×16): 5 mg via ORAL
  Filled 2017-08-15 (×17): qty 1

## 2017-08-15 MED ORDER — SENNOSIDES-DOCUSATE SODIUM 8.6-50 MG PO TABS: 1.0000 | ORAL_TABLET | Freq: Every day | ORAL | Status: DC

## 2017-08-15 MED ORDER — SENNOSIDES-DOCUSATE SODIUM 8.6-50 MG PO TABS
2.0000 | ORAL_TABLET | Freq: Two times a day (BID) | ORAL | Status: DC
  Administered 2017-08-15 – 2017-08-23 (×8): 2 via ORAL
  Filled 2017-08-15 (×13): qty 2

## 2017-08-15 NOTE — Plan of Care (Signed)
  Progressing Clinical Measurements: Ability to maintain clinical measurements within normal limits will improve 08/15/2017 1110 - Progressing by Kerrin Mo, RN Will remain free from infection 08/15/2017 1110 - Progressing by Kerrin Mo, RN Note ON PO Cipro Respiratory complications will improve 08/15/2017 1110 - Progressing by Kerrin Mo, RN Cardiovascular complication will be avoided 08/15/2017 1110 - Progressing by Kerrin Mo, RN Pain Managment: General experience of comfort will improve 08/15/2017 1110 - Progressing by Kerrin Mo, RN

## 2017-08-15 NOTE — Procedures (Signed)
Ultrasound-guided  therapeutic paracentesis performed yielding 4.8 liters of bloody fluid. No immediate complications.  

## 2017-08-15 NOTE — Consult Note (Signed)
New Hematology/Oncology Consult   Referral VO:[id number] Rai     Reason for Referral: Metastatic rectal cancer  HPI: [name] is a 27 year old woman metastatic rectal cancer admitted to [facility] 08/12/2017 with abdominal swelling and pain.  CT abdomen/pelvis showed a large rectal mass that appeared necrotic and with discrete tract extending toward the vagina/perineum; interval development of massive ascites; diffuse caking of the omentum; descending colostomy bulging due to peritoneal metastases; hepatic metastases measuring up to 4.7 cm in the upper right liver; large thrombus in the left femoral and common femoral veins.  She received previous oncology care at [facility].  She was initially diagnosed with rectal cancer July 2018 after presenting with change in bowel habits and rectal bleeding.  She underwent a colonoscopy 01/25/2017 with biopsy showing invasive moderately differentiated adenocarcinoma with mucinous features.  CT chest/abdomen/pelvis 02/11/2017 showed a very large ulcerated rectal mass with severe wall thickening over the distal 11.2 cm of the rectum extending to the anus with surrounding perirectal adenopathy and pelvic sidewall adenopathy.  There were at least 2 metastatic lesions in the right hepatic lobe. MRI of the pelvis 02/14/2017 showed a T4b, N2 bulky rectal mass with deep infiltrative invasion of the bilateral pelvic sidewalls, presacral space, mesorectal fascia, distal branches of the internal iliac vasculature and sacral plexus.  There was abnormal signal intensity along both sciatic nerves possibly representing perineural extension.  The mass was noted to approximate the posterior vaginal wall without definite invasion.  PET scan 02/15/2017 showed colorectal malignancy with mesorectal fascia infiltration and 2 FDG avid hepatic metastases.  Regional lymphadenopathy found on prior imaging was not FDG avid on the PET scan.  On 02/16/2017 she underwent  loop ileostomy creation by Dr. Morton Stall.  She completed 4 cycles of FOLFIRINOX 03/09/2017 through 04/27/2017.  CT abdomen/pelvis 05/06/2017 showed the large rectal mass and known hepatic metastases similar to recent comparison.  She completed a partial course of radiation/Xeloda.  CTs 07/12/2017 showed a large necrotic invasive rectal mass; increased size of known liver metastatic lesions; interval increase in size of peritoneal implants; new development of moderate volume abdominal ascites; gas again present within the anterior rectal wall closely opposing the gas-filled vagina.  Rectovaginal fistula not excluded.  The plan was for her to begin FOLFIRI/vectibix.  She has requested to transfer her care to the [place] at [facility]    Past Medical History:  Diagnosis Date  . Ascites   . Bronchitis   . Colon cancer ([place])   . Pregnancy induced hypertension   . S/P colostomy ([place])   :  Past Surgical History:  Procedure Laterality Date  . COLON SURGERY    :   Current Facility-Administered Medications:  .  apixaban (ELIQUIS) tablet 5 mg, 5 mg, Oral, BID, Shade, Christine E, RPH, 5 mg at 08/14/17 2043 .  ciprofloxacin (CIPRO) tablet 500 mg, 500 mg, Oral, BID, Caren Griffins, MD, 500 mg at 08/15/17 0831 .  fentaNYL (DURAGESIC - dosed mcg/hr) patch 75 mcg, 75 mcg, Transdermal, Q72H, Rai, Ripudeep K, MD, 75 mcg at 08/14/17 1809 .  HYDROmorphone (DILAUDID) injection 1-2 mg, 1-2 mg, Intravenous, Q1H PRN, Rai, Ripudeep K, MD, 2 mg at 08/15/17 0831 .  morphine (MSIR) tablet 15 mg, 15 mg, Oral, Q4H PRN, Caren Griffins, MD, 15 mg at 08/12/17 1826 .  ondansetron (ZOFRAN) injection 4 mg, 4 mg, Intravenous, Q6H PRN, Caren Griffins, MD, 4 mg at 08/13/17 0446 .  polyethylene glycol (MIRALAX /  GLYCOLAX) packet 17 g, 17 g, Oral, Daily, Caren Griffins, MD, 17 g at 08/14/17 1052 .  promethazine (PHENERGAN) injection 12.5 mg, 12.5 mg, Intravenous, Q6H PRN, Caren Griffins, MD, 12.5 mg at 08/12/17  2016 .  senna-docusate (Senokot-S) tablet 1 tablet, 1 tablet, Oral, QHS PRN, Gherghe, Costin M, MD .  sodium chloride flush (NS) 0.9 % injection 10-40 mL, 10-40 mL, Intracatheter, PRN, Caren Griffins, MD:  . apixaban  5 mg Oral BID  . ciprofloxacin  500 mg Oral BID  . fentaNYL  75 mcg Transdermal Q72H  . polyethylene glycol  17 g Oral Daily  :  No Known Allergies:  FH: Paternal grandmother with colon cancer.  Paternal aunt with lung cancer.  Paternal aunt with pancreas cancer.  SOCIAL HISTORY: She lives in [place].  She is single.  She has 2 children, a daughter age 13 and a son age 33.  She was previously employed at a [employer].  No tobacco or alcohol use.  Review of Systems: She reports weight loss.  Appetite is diminished.  She attributes this to the abdominal distention.  Main pain is located in the abdominal region.  She takes Dilaudid as needed.  As an outpatient she was taking morphine which did not help the pain.  No unusual headaches.  No vision change.  No fevers or sweats.  She has mild dyspnea on exertion.  No chest pain.  She reports her heart "always beats fast".  She had a recent episode of nausea/vomiting.  She notes decreased output from the colostomy.  She is passing "gas".  She denies any urinary symptoms.  She notes that the right leg feels "weak".  She has a burning sensation at the right outer thigh.  She reports a history of a left leg DVT.  She is on Eliquis.   Physical Exam:  Blood pressure 135/81, pulse (!) 136, temperature 98.3 F (36.8 C), temperature source Oral, resp. rate 18, height _0  (1.6 m), weight 145 lb 15.1 oz (66.2 kg), SpO2 95 %.  HEENT: No thrush or ulcers. Lungs: Lungs clear bilaterally. Cardiac: Regular, tachycardic. Abdomen: Abdomen distended.  Exam consistent with ascites.  Left lower quadrant colostomy.  Vascular: Edema throughout the left leg. Lymph nodes: Small palpable bilateral inguinal lymph  nodes. Neurologic: Alert and oriented.  Follows commands. Port-A-Cath without erythema.  LABS:  Recent Labs    08/14/17 0409 08/15/17 0357  WBC 9.3 10.5  HGB 7.1* 7.9*  HCT 21.9* 24.6*  PLT 285 291    Recent Labs    08/13/17 0350 08/14/17 0409  NA 128* 128*  K 4.9 4.3  CL 97* 97*  CO2 23 25  GLUCOSE 106* 99  BUN 14 10  CREATININE 0.87 0.64  CALCIUM 8.9 8.2*      RADIOLOGY:  Dg Chest 2 View  Result Date: 08/12/2017 CLINICAL DATA:  Stage IV adenocarcinoma of the rectum. EXAM: CHEST  2 VIEW COMPARISON:  July 06, 2017 FINDINGS: The study is limited due to low lung volumes. A double lumen port terminates near the caval atrial junction. The heart, hila, mediastinum are normal. No pneumothorax. No other acute abnormalities. IMPRESSION: No acute abnormality identified.  Low lung volumes limit evaluation. Electronically Signed   By: Dorise Bullion III M.D   On: 08/12/2017 19:43   Ct Abdomen Pelvis W Contrast  Result Date: 08/11/2017 CLINICAL DATA:  Generalized acute abdominal pain EXAM: CT ABDOMEN AND PELVIS WITH CONTRAST TECHNIQUE: Multidetector CT  imaging of the abdomen and pelvis was performed using the standard protocol following bolus administration of intravenous contrast. CONTRAST:  160m ISOVUE-300 IOPAMIDOL (ISOVUE-300) INJECTION 61% COMPARISON:  04/17/2017. [facility] FINDINGS: Lower chest: Pericardial fluid that appears low-density. Porta catheter tip extends to the tricuspid valve. Atelectasis in the right more than left lower lobes. Hepatobiliary: Hepatic metastases that have increased from prior, up to 4.7 cm in the upper right liver. The liver is steatotic.No evidence of biliary obstruction or stone. Pancreas: Unremarkable. Spleen: Unremarkable. Adrenals/Urinary Tract: Negative adrenals. No hydronephrosis or stone. Unremarkable bladder. Stomach/Bowel: Patient has rectal cancer with large rectal mass that is low-density  peripherally and enhancing centrally. There is dystrophic calcifications about the mass which may reflect treatment change. The mass appears necrotic and with discrete track extending towards the vagina/perineum. This is known per comparison report. No pneumoperitoneum. There has been interval development of massive ascites with enhancing septations best seen about the liver, which is scalloped. There is diffuse caking of the omentum. Descending colostomy which is bulging due to peritoneal metastases. Diffuse stool in the colon. Negative for small bowel obstruction. Vascular/Lymphatic: Large thrombus in the left femoral and common femoral veins. No acute arterial finding. No mass or adenopathy. Reproductive:No pathologic findings. Other: Ascites noted above. Musculoskeletal: No acute abnormalities. Critical Value/emergent results were called by telephone at the time of interpretation on 08/11/2017 at 10:14 am to Dr. JNanda Quinton, who verbally acknowledged these results. IMPRESSION: 1. Rectal cancer with extensive peritoneal metastatic disease and massive ascites. The abdomen appears tense. Known hepatic metastatic disease. 2. Necrotic primary rectal cancer with visible tract into the vagina/perineum. 3. Extensive DVT in the left femoral and common femoral veins. 4. Anasarca and small pericardial effusion. Electronically Signed   By: JMonte FantasiaM.D.   On: 08/11/2017 10:18   [place]acentesis  Result Date: 08/13/2017 INDICATION: Metastatic rectal cancer. Recurrent malignant ascites. Request for diagnostic and therapeutic paracentesis. EXAM: ULTRASOUND GUIDED RIGHT LATERAL ABDOMEN PARACENTESIS MEDICATIONS: 1% Lidocaine = 10 mL COMPLICATIONS: None immediate. PROCEDURE: Informed written consent was obtained from the patient after a discussion of the risks, benefits and alternatives to treatment. A timeout was performed prior to the initiation of the procedure. Initial ultrasound scanning demonstrates a large amount  of ascites within the right lower abdominal quadrant. The right lower abdomen was prepped and draped in the usual sterile fashion. 1% lidocaine with epinephrine was used for local anesthesia. Following this, a 19 gauge, 7-cm, Yueh catheter was introduced. An ultrasound image was saved for documentation purposes. The paracentesis was performed. The catheter was removed and a dressing was applied. The patient tolerated the procedure well without immediate post procedural complication. FINDINGS: A total of approximately 5 liters of clear red fluid was removed. Samples were sent to the laboratory as requested by the clinical team. IMPRESSION: Successful ultrasound-guided paracentesis yielding 5 liters of peritoneal fluid. Read by: WGareth Eagle PA-C Electronically Signed   By: GAletta EdouardM.D.   On: 08/13/2017 10:54    Assessment and Plan:   1. Rectal cancer July 2018   Colonoscopy 01/25/2017-biopsy showed invasive moderately differentiated adenocarcinoma with mucinous features.    CT chest/abdomen/pelvis 02/11/2017-very large ulcerated rectal mass with severe wall thickening over the distal 11.2 cm of the rectum extending to the anus with surrounding perirectal adenopathy and pelvic sidewall adenopathy.  At least 2 metastatic lesions in the right hepatic lobe.   MRI of the pelvis 02/14/2017-T4b, N2 bulky rectal mass with  deep infiltrative invasion of the bilateral pelvic sidewalls, presacral space, mesorectal fascia, distal branches of the internal iliac vasculature and sacral plexus.  There was abnormal signal intensity along both sciatic nerves possibly representing perineural extension.  The mass was noted to approximate the posterior vaginal wall without definite invasion.    PET scan 02/15/2017-colorectal malignancy with mesorectal fascia infiltration and 2 FDG avid hepatic metastases.  Regional lymphadenopathy found on prior imaging was not FDG avid on the PET scan.    02/16/2017-loop ileostomy  creation by Dr. Morton Stall.   Foundation 1-KRAS/NRAS wild-type; microsatellite stable; tumor mutational burden 3; BRCA1 rearrangement intron 2  4 cycles of FOLFIRINOX 03/09/2017 through 04/27/2017.    CT abdomen/pelvis 05/06/2017-large rectal mass and known hepatic metastases similar to recent comparison.    Status post partial course of radiation/Xeloda.   CTs 07/12/2017-large necrotic invasive rectal mass; increased size of known liver metastatic lesions; interval increase in size of peritoneal implants; new development of moderate volume abdominal ascites; gas again present within the anterior rectal wall closely opposing the gas-filled vagina.  Rectovaginal fistula not excluded.    Admitted to [facility] 08/12/2017 with abdominal pain and swelling  CT abdomen/pelvis 08/11/2017-large rectal mass that appeared necrotic and with discrete tract extending toward the vagina/perineum; interval development of massive ascites; diffuse caking of the omentum; descending colostomy bulging due to peritoneal metastases; hepatic metastases measuring up to 4.7 cm in the upper right liver; large thrombus in the left femoral and common femoral veins. 2. Left lower extremity DVT.  On Eliquis. 3. Ascites secondary to #1. 4. Tachycardia.  Disposition: Ms. Switalski is a 27 year old woman with metastatic rectal cancer.  She has been treated in the past with FOLFIRINOX and a partial course of radiation/Xeloda.  Restaging CTs 07/12/2017 showed evidence of progressive disease.  She received previous oncology care at [facility].  Per review of records in Care Everywhere the plan was to begin treatment with FOLFIRI/Panitumumab.  She would like to transfer her care to the [place] at [facility].  She was admitted to the hospital 08/12/2017 with abdominal pain/distention.  She has significant symptomatic ascites.  We are referring her for a therapeutic paracentesis.  We will see her as an outpatient  on 08/23/2017 with plans to proceed with FOLFIRI/Panitumumab.  We reviewed potential toxicities associated with this regimen.  Oncology will continue to follow while she is hospitalized.   Ned Card, NP 08/15/2017, 9:43 AM   This was a shared visit with Ned Card.  Ms. Schum was interviewed and examined.  She has been diagnosed with metastatic rectal cancer.  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  She has previously been treated with FOLFIRINOX and Xeloda/radiation.  Disease progression was noted on CTs at [facility] last month.  The tumor is RAS wild-type.  I recommend proceeding with FOLFIRI/panitumumab.  We reviewed potential toxicities associated with this regimen and she agrees to proceed.  We will add an oral pain regimen.  She will be referred for a palliative paracentesis today.  I will follow Ms. Zirbes in the hospital and arrange for salvage systemic therapy to begin within the next 1-2 weeks.  Julieanne Manson, MD

## 2017-08-15 NOTE — Progress Notes (Signed)
PHARMACY NOTE -  Eliquis  Pharmacy has been assisting with dosing of Eliquis for recent DVT 07/28/17. Dosage remains stable at 5 mg PO bid and need for further dosage adjustment appears unlikely at present given excellent renal function.  Will sign off at this time.  Please reconsult if a change in clinical status warrants re-evaluation of dosage.   Reuel Boom, PharmD, BCPS [phone] 08/15/2017, 1:05 PM

## 2017-08-15 NOTE — Progress Notes (Signed)
[facility]                                                                              Patient Demographics  Amber Silva, is a 27 y.o. female, DOB - January 26, 1991, LFY:[id number]  Admit date - 08/11/2017   Admitting Physician Etta Quill, DO  Outpatient Primary MD for the patient is [name]., MD  Outpatient specialists:   LOS - 3  days   Medical records reviewed and are as summarized below:    Chief Complaint  Patient presents with  . Abdominal Pain       Brief summary   27 y.o.femalewith medical history significant ofstage IV adenocarcinoma of the rectum status post sigmoid colostomy, chemotherapy, palliative radiation therapy, cancer metastasized tothe liverand peritoneal metastatic disease, with massive malignant ascites, who gets all of her care at [facility] and is looking for a second opinion here at [facility] health, presents to the emergency room with progressive abdominal distention and discomfort.  She has recently developed malignant ascites with her first paracentesis last week, and at that time she was also diagnosed with SBP and placed on ciprofloxacin.  She was found to have DVT recently and she did not Eliquis since.  There are concerns based on [facility] notes that she has pericardial metastasis.  Assessment & Plan     Metastatic rectal cancer -With extensive peritoneal metastatic disease, hepatic metastatic disease, concern for pericardial metastasis, with ascites -Discussed with Dr. Learta Codding this morning, appreciate for seeing the patient, plan for starting chemotherapy on 2/19 outpatient once patient is discharged  Malignant ascites/recent history of spontaneous bacterial peritonitis -Status post large volume paracentesis on 2/9, 5 L removed.  Empiric albumin given.   -Ascitic fluid 2/9 with 117 WBC, 15 neutrophils, cannot exclude prior SBP since she has been on ciprofloxacin already -Abdomen feeling tense,  repeat ultrasound-guided paracentesis ordered today, Eliquis held  Acute on chronic pain -Increase fentanyl patch to 75 MCG, continue IV Dilaudid, morphine, patient does not feel the pain is controlled -palliative medicine consulted for pain control  Moderate to severe protein calorie malnutrition -Nutrition consulted  Recent DVT -Extensive DVT noted even on the CT scan of the left femoral and common femoral veins. -Resume Eliquis tonight Dr. ultrasound-guided presently   Hyponatremia -Currently eating and drinking well, sodium stable -Serum osmolarity 267, urine osmolality 830   Sinus tachycardia -TSH slightly elevated.  Her tachycardia was worked up at [facility] without any clear cause but thought to be due to progressive cancer -Free T4 1.25  Code Status: full  DVT Prophylaxis:  apixaban Family Communication: Discussed in detail with the patient, all imaging results, lab results explained to the patient   Disposition Plan: Once pain is controlled, possibly in next 24-48 hours  Time Spent in minutes  25 minutes  Procedures:  Ultrasound-guided paracentesis 2/9, repeat 2/11  Consultants:   Oncology Palliative medicine  Antimicrobials:   Ciprofloxacin 2/8>   Medications  Scheduled Meds: . apixaban  5 mg Oral BID  . ciprofloxacin  500 mg Oral BID  . feeding supplement (ENSURE ENLIVE)  237 mL Oral BID BM  .  fentaNYL  75 mcg Transdermal Q72H  . lidocaine      . polyethylene glycol  17 g Oral Daily   Continuous Infusions: PRN Meds:.HYDROmorphone (DILAUDID) injection, morphine, ondansetron (ZOFRAN) IV, promethazine, senna-docusate, sodium chloride flush   Antibiotics   Anti-infectives (From admission, onward)   Start     Dose/Rate Route Frequency Ordered Stop   08/12/17 2000  ciprofloxacin (CIPRO) tablet 500 mg     500 mg Oral 2 times daily 08/12/17 1800          Subjective:   Amber Silva was seen and examined today.  Feels abdomen is tense, pain  is not well controlled, 8/10, No nausea vomiting.  Patient denies dizziness, chest pain, shortness of breath.  No fevers.  Objective:   Vitals:   08/15/17 0535 08/15/17 1120 08/15/17 1130 08/15/17 1140  BP: 135/81 120/88 (!) 131/96 110/80  Pulse: (!) 136     Resp: 18     Temp: 98.3 F (36.8 C)     TempSrc: Oral     SpO2: 95%     Weight:      Height:        Intake/Output Summary (Last 24 hours) at 08/15/2017 1340 Last data filed at 08/15/2017 1030 Gross per 24 hour  Intake 480 ml  Output 1 ml  Net 479 ml     Wt Readings from Last 3 Encounters:  08/14/17 66.2 kg (145 lb 15.1 oz)  07/06/17 59.4 kg (131 lb)  04/17/17 64 kg (141 lb)     Exam   General: Alert and oriented x 3, NAD  Eyes:  HEENT:    Cardiovascular: S1 S2 auscultated, Regular rate and rhythm. No pedal edema b/l  Respiratory: Clear to auscultation bilaterally, no wheezing, rales or rhonchi  Gastrointestinal: Soft, nontender, distended/ascites, + bowel sounds, LLQ colostomy  Ext: edema in the left leg  Neuro: no new deficit  Musculoskeletal: No digital cyanosis, clubbing  Skin: No rashes  Psych: Normal affect and demeanor, alert and oriented x3    Data Reviewed:  I have personally reviewed following labs and imaging studies  Micro Results Recent Results (from the past 240 hour(s))  Blood Culture (routine x 2)     Status: None (Preliminary result)   Collection Time: 08/11/17  8:45 AM  Result Value Ref Range Status   Specimen Description   Final    BLOOD RIGHT ANTECUBITAL Performed at [facility], [address] [place], [place] [address]    Special Requests   Final    BOTTLES DRAWN AEROBIC AND ANAEROBIC Blood Culture adequate volume Performed at [facility], [address] [place], [place] [address]    Culture   Final    NO GROWTH 4 DAYS Performed at [facility], [address] [place], [place] [address]    Report Status PENDING  Incomplete  Blood  Culture (routine x 2)     Status: None (Preliminary result)   Collection Time: 08/11/17  8:55 AM  Result Value Ref Range Status   Specimen Description   Final    BLOOD LEFT ANTECUBITAL Performed at [facility], [address] [place], [place] [address]    Special Requests   Final    BOTTLES DRAWN AEROBIC AND ANAEROBIC Blood Culture adequate volume Performed at [facility], [address] [place], [place] [address]    Culture   Final    NO GROWTH 4 DAYS Performed at  [facility], [place] [address], [place], [place] [address]    Report Status PENDING  Incomplete  Culture, body fluid-bottle     Status: None (Preliminary result)   Collection Time: 08/11/17 10:35 AM  Result Value Ref Range Status   Specimen Description ASCITIC  Final   Special Requests NONE  Final   Culture   Final    NO GROWTH 4 DAYS Performed at [facility] [address], [place], [place] [address]    Report Status PENDING  Incomplete  Gram stain     Status: None   Collection Time: 08/11/17 10:35 AM  Result Value Ref Range Status   Specimen Description ASCITIC  Final   Special Requests NONE  Final   Gram Stain   Final    NO WBC SEEN NO ORGANISMS SEEN Performed at [facility], [place] [address], [place], [place] [address]    Report Status 08/11/2017 FINAL  Final  Culture, body fluid-bottle     Status: None (Preliminary result)   Collection Time: 08/13/17 10:15 AM  Result Value Ref Range Status   Specimen Description PERITONEAL  Final   Special Requests NONE  Final   Culture   Final    NO GROWTH 2 DAYS Performed at [facility], [place] [address] [place], [place] [address]    Report Status PENDING  Incomplete  Gram stain     Status: None   Collection Time: 08/13/17 10:15 AM  Result Value Ref Range Status   Specimen Description PERITONEAL  Final   Special Requests NONE  Final   Gram Stain   Final    FEW WBC PRESENT,BOTH PMN AND MONONUCLEAR NO ORGANISMS  SEEN Performed at [facility], [address], [place], [place] [address]    Report Status 08/13/2017 FINAL  Final    Radiology Reports Dg Chest 2 View  Result Date: 08/12/2017 CLINICAL DATA:  Stage IV adenocarcinoma of the rectum. EXAM: CHEST  2 VIEW COMPARISON:  July 06, 2017 FINDINGS: The study is limited due to low lung volumes. A double lumen port terminates near the caval atrial junction. The heart, hila, mediastinum are normal. No pneumothorax. No other acute abnormalities. IMPRESSION: No acute abnormality identified.  Low lung volumes limit evaluation. Electronically Signed   By: Dorise Bullion III M.D   On: 08/12/2017 19:43   Ct Abdomen Pelvis W Contrast  Result Date: 08/11/2017 CLINICAL DATA:  Generalized acute abdominal pain EXAM: CT ABDOMEN AND PELVIS WITH CONTRAST TECHNIQUE: Multidetector CT imaging of the abdomen and pelvis was performed using the standard protocol following bolus administration of intravenous contrast. CONTRAST:  181mL ISOVUE-300 IOPAMIDOL (ISOVUE-300) INJECTION 61% COMPARISON:  04/17/2017. report from abdominal CT 07/28/2017 at [facility]. FINDINGS: Lower chest: Pericardial fluid that appears low-density. Porta catheter tip extends to the tricuspid valve. Atelectasis in the right more than left lower lobes. Hepatobiliary: Hepatic metastases that have increased from prior, up to 4.7 cm in the upper right liver. The liver is steatotic.No evidence of biliary obstruction or stone. Pancreas: Unremarkable. Spleen: Unremarkable. Adrenals/Urinary Tract: Negative adrenals. No hydronephrosis or stone. Unremarkable bladder. Stomach/Bowel: Patient has rectal cancer with large rectal mass that is low-density peripherally and enhancing centrally. There is dystrophic calcifications about the mass which may reflect treatment change. The mass appears necrotic and with discrete track extending towards the vagina/perineum. This is known per comparison report. No pneumoperitoneum.  There has been interval development of massive ascites with enhancing septations best seen  about the liver, which is scalloped. There is diffuse caking of the omentum. Descending colostomy which is bulging due to peritoneal metastases. Diffuse stool in the colon. Negative for small bowel obstruction. Vascular/Lymphatic: Large thrombus in the left femoral and common femoral veins. No acute arterial finding. No mass or adenopathy. Reproductive:No pathologic findings. Other: Ascites noted above. Musculoskeletal: No acute abnormalities. Critical Value/emergent results were called by telephone at the time of interpretation on 08/11/2017 at 10:14 am to Dr. Nanda Quinton , who verbally acknowledged these results. IMPRESSION: 1. Rectal cancer with extensive peritoneal metastatic disease and massive ascites. The abdomen appears tense. Known hepatic metastatic disease. 2. Necrotic primary rectal cancer with visible tract into the vagina/perineum. 3. Extensive DVT in the left femoral and common femoral veins. 4. Anasarca and small pericardial effusion. Electronically Signed   By: Monte Fantasia M.D.   On: 08/11/2017 10:18   US Paracentesis  Result Date: 08/15/2017 INDICATION: Patient with history of metastatic rectal cancer, recurrent ascites. Request made for therapeutic paracentesis up to 6 liters. EXAM: ULTRASOUND GUIDED THERAPEUTIC PARACENTESIS MEDICATIONS: None. COMPLICATIONS: None immediate. PROCEDURE: Informed written consent was obtained from the patient after a discussion of the risks, benefits and alternatives to treatment. A timeout was performed prior to the initiation of the procedure. Initial ultrasound scanning demonstrates a large amount of ascites within the left mid to lower abdominal quadrant. The left mid to lower abdomen was prepped and draped in the usual sterile fashion. 1% lidocaine was used for local anesthesia. Following this, a Yueh catheter was introduced. An ultrasound image was saved for  documentation purposes. The paracentesis was performed. The catheter was removed and a dressing was applied. The patient tolerated the procedure well without immediate post procedural complication. FINDINGS: A total of approximately 4.8 liters of bloody fluid was removed. IMPRESSION: Successful ultrasound-guided therapeutic paracentesis yielding 4.8 liters of peritoneal fluid. Read by: Rowe Robert, PA-C Electronically Signed   By: Markus Daft M.D.   On: 08/15/2017 12:35   US Paracentesis  Result Date: 08/13/2017 INDICATION: Metastatic rectal cancer. Recurrent malignant ascites. Request for diagnostic and therapeutic paracentesis. EXAM: ULTRASOUND GUIDED RIGHT LATERAL ABDOMEN PARACENTESIS MEDICATIONS: 1% Lidocaine = 10 mL COMPLICATIONS: None immediate. PROCEDURE: Informed written consent was obtained from the patient after a discussion of the risks, benefits and alternatives to treatment. A timeout was performed prior to the initiation of the procedure. Initial ultrasound scanning demonstrates a large amount of ascites within the right lower abdominal quadrant. The right lower abdomen was prepped and draped in the usual sterile fashion. 1% lidocaine with epinephrine was used for local anesthesia. Following this, a 19 gauge, 7-cm, Yueh catheter was introduced. An ultrasound image was saved for documentation purposes. The paracentesis was performed. The catheter was removed and a dressing was applied. The patient tolerated the procedure well without immediate post procedural complication. FINDINGS: A total of approximately 5 liters of clear red fluid was removed. Samples were sent to the laboratory as requested by the clinical team. IMPRESSION: Successful ultrasound-guided paracentesis yielding 5 liters of peritoneal fluid. Read by: Gareth Eagle, PA-C Electronically Signed   By: Aletta Edouard M.D.   On: 08/13/2017 10:54    Lab Data:  CBC: Recent Labs  Lab 08/11/17 0850 08/13/17 0350 08/14/17 0409  08/15/17 0357  WBC 9.5 11.8* 9.3 10.5  NEUTROABS 8.8*  --   --   --   HGB 9.1* 8.2* 7.1* 7.9*  HCT 28.5* 25.7* 21.9* 24.6*  MCV 81.9 82.4 81.7 82.6  PLT 547*  415* 285 836   Basic Metabolic Panel: Recent Labs  Lab 08/11/17 0850 08/13/17 0350 08/14/17 0409  NA 128* 128* 128*  K 5.0 4.9 4.3  CL 92* 97* 97*  CO2 23 23 25   GLUCOSE 105* 106* 99  BUN 16 14 10   CREATININE 0.93 0.87 0.64  CALCIUM 9.2 8.9 8.2*  MG  --  2.1 1.9  PHOS  --  3.9 3.0   GFR: Estimated Creatinine Clearance: 96.6 mL/min (by C-G formula based on SCr of 0.64 mg/dL). Liver Function Tests: Recent Labs  Lab 08/11/17 0850 08/13/17 0350 08/14/17 0409  AST 23 19 22   ALT 8* 9* 8*  ALKPHOS 131* 139* 108  BILITOT 0.7 0.4 0.2*  PROT 7.4 6.7 5.6*  ALBUMIN 2.1* 1.9* 1.9*   No results for input(s): LIPASE, AMYLASE in the last 168 hours. No results for input(s): AMMONIA in the last 168 hours. Coagulation Profile: Recent Labs  Lab 08/11/17 0850  INR 1.52   Cardiac Enzymes: No results for input(s): CKTOTAL, CKMB, CKMBINDEX, TROPONINI in the last 168 hours. BNP (last 3 results) No results for input(s): PROBNP in the last 8760 hours. HbA1C: No results for input(s): HGBA1C in the last 72 hours. CBG: Recent Labs  Lab 08/11/17 1126  GLUCAP 111*   Lipid Profile: No results for input(s): CHOL, HDL, LDLCALC, TRIG, CHOLHDL, LDLDIRECT in the last 72 hours. Thyroid Function Tests: Recent Labs    08/12/17 1827 08/14/17 0409  TSH 8.362*  --   FREET4  --  1.25*  T3FREE  --  2.1   Anemia Panel: No results for input(s): VITAMINB12, FOLATE, FERRITIN, TIBC, IRON, RETICCTPCT in the last 72 hours. Urine analysis:    Component Value Date/Time   COLORURINE YELLOW 04/17/2017 1611   APPEARANCEUR CLEAR 04/17/2017 1611   LABSPEC 1.010 04/17/2017 1611   PHURINE 7.0 04/17/2017 1611   GLUCOSEU NEGATIVE 04/17/2017 1611   HGBUR NEGATIVE 04/17/2017 1611   BILIRUBINUR NEGATIVE 04/17/2017 1611   KETONESUR NEGATIVE  04/17/2017 1611   PROTEINUR NEGATIVE 04/17/2017 1611   UROBILINOGEN >8.0 (H) 02/11/2014 1125   NITRITE NEGATIVE 04/17/2017 1611   LEUKOCYTESUR NEGATIVE 04/17/2017 1611     Coal Nearhood M.D. [facility] 08/15/2017, 1:40 PM  Pager: [phone] Between 7am to 7pm - call Pager - [phone]  After 7pm go to [email or web address] - password [email or web address]  Call night coverage person covering after 7pm

## 2017-08-15 NOTE — Progress Notes (Signed)
Initial Nutrition Assessment  DOCUMENTATION CODES:   Non-severe (moderate) malnutrition in context of chronic illness  INTERVENTION:   Ensure Enlive (strawberry flavor) po BID, each supplement provides 350 kcal and 20 grams of protein  NUTRITION DIAGNOSIS:   Moderate Malnutrition related to chronic illness, cancer and cancer related treatments as evidenced by moderate fat depletion, moderate muscle depletion.  GOAL:   Patient will meet greater than or equal to 90% of their needs  MONITOR:   PO intake, Supplement acceptance, Labs, Weight trends, I & O's  REASON FOR ASSESSMENT:   Consult Assessment of nutrition requirement/status  ASSESSMENT:   Pt with PMH of stage IV adenocarcinoma of the rectum with metastasis to the liver and peritoneal s/p sigmoid colostomy, chemotherapy, and palliative radiation therapy presents with abdominal distention and discomfort found malignant ascites   Pt lethargic at time of visit.  Pt reports a good appetite when she has "no fluid", and that her PO intake is lower when ascites is present.  Pt s/p multiple US guided paracentesis (02/09- 5 L removed) and (02/11- 4.8 L removed) Recorded intake of pt ranging from 50-80% since admission. Pt amenable to nutritional supplementation while admitted, reports enjoying Strawberry flavor. Palliative Care Team consulted. Per chart, pt with poor prognosis will likely need hospice care upon discharge.  Patient states her clothes have been "falling off" after all her weight loss. Pt reports a UBW of 200-215 lbs prior to diagnoses.  Pt reports vomiting this morning and episodes of diarrhea.   Labs reviewed; Na 128, Albumin 1.9 Medications reviewed; Miralax  NUTRITION - FOCUSED PHYSICAL EXAM:    Most Recent Value  Orbital Region  Moderate depletion  Upper Arm Region  Moderate depletion  Thoracic and Lumbar Region  Unable to assess [severe ascites]  Buccal Region  Moderate depletion  Temple Region   Moderate depletion  Clavicle Bone Region  Severe depletion  Clavicle and Acromion Bone Region  Moderate depletion  Scapular Bone Region  Unable to assess  Dorsal Hand  Severe depletion  Patellar Region  Moderate depletion  Anterior Thigh Region  Moderate depletion  Posterior Calf Region  Moderate depletion  Edema (RD Assessment)  None      Diet Order:  Diet regular Room service appropriate? Yes; Fluid consistency: Thin  EDUCATION NEEDS:   Not appropriate for education at this time  Skin:  Skin Assessment: Reviewed RN Assessment  Last BM:  08/14/17  Height:   Ht Readings from Last 1 Encounters:  08/12/17 5\' 3"  (1.6 m)   Weight:   Wt Readings from Last 1 Encounters:  08/14/17 145 lb 15.1 oz (66.2 kg)   Ideal Body Weight:  52.3 kg  BMI:  Body mass index is 25.85 kg/m.  Estimated Nutritional Needs:   Kcal:  7048-8891  Protein:  80-95 grams  Fluid:  >/= 1.9 L/d  Parks Ranger, MS, RDN, LDN 08/15/2017 1:03 PM

## 2017-08-15 NOTE — Consult Note (Addendum)
Consultation Note Date: 08/15/2017   Patient Name: Amber Silva  DOB: 01-07-1991  MRN: [id number]  Age / Sex: 27 y.o., female  PCP: [name]., MD Referring Physician: Mendel Corning, MD  Reason for Consultation: Establishing goals of care, Non pain symptom management, Pain control and Psychosocial/spiritual support  HPI/Patient Profile: 27 y.o. female  admitted on 08/11/2017 with abdominal pain and swelling. She has a PMH of metastatic rectal cancer, ascites, s/p colostomy.  She has been receiving Oncology treatments and care at [facility]. She was initially diagnosed with rectal cancer July 2018. She underwent a loop ileostomy on 02/16/2017 and completed 4 cycles of FOLFIRNOX with last treatment 04/27/2017. She completed a partial course of radiation and Xeloda to pelvic area. Recent CT abdomen/pelvis showed a large rectal mass that appeared necrotic and with discrete tract extending toward the vagina/perineum; interval development of massive ascites; diffuse caking of the omentum; descending colostomy bulging due to peritoneal metastases; hepatic metastases measuring up to 4.7 cm in the upper right liver; large thrombus in the left femoral and common femoral veins.    Clinical Assessment and Goals of Care:  Dr. Rowe Pavy and I have reviewed medical records including EPIC notes, labs and imaging, assessed the patient and then met at the bedside with patient to discuss diagnosis, GOC, and symptom management.   We introduced Palliative Medicine as specialized medical care for people living with serious illness. It focuses on providing relief from the symptoms and stress of a serious illness. The goal is to improve quality of life for both the patient and the family.  We discussed a brief life review of the patient. She reports transferring her care to [place] from [facility]. She recently moved back home with her mother for the extra help and support during her treatments and some much needed care for her 2 children (ages 34 and 1). She is no longer employed due to the cancer. Her mom and entire family is her main support system.   As far as functional and nutritional status she has become weaker over the past few weeks. She has lost about 50-60lbs since her diagnosis. Her appetite is fair depending on how her day is going. If she is experiencing some abdominal pain/ascites her appetite is not the best, however, if she is feeling ok she feels her appetite is sufficient. Her drinking has not decreased compared to her food intake. She has some generalized weakness and fatigue. She is sleepy and fatigued at times due to uncontrolled pain at home.   We were unable to discuss her current illness and how to best manage in the future. She had recently received IV pain medication and stated "she couldn't talk long because the medication makes her sleepy and she is tired." We were able to discuss what was most important to her and that was her symptoms at this time. She reports her pain is somewhat controlled on the IV Dilaudid today along with  the increase in her Fentanyl patch to 75 mcg. She reports knowing she can receive the prn medications. She was seen by the nutritionist and verbalizes awareness and plans to began drinking ensure for added nutritional support and protein. She has had some episodes of nausea which she feels is being controlled effectively. She is concerned about constipation and reports despite stool softeners and Miralax she has not been able to have a bowel movement. We discussed working to get her on a good regimen and possibly adjusting her regimen.    I attempted to elicit values and goals of care important to the patient.  At this time symptom management is most important to assist with maintaining her quality of life, outside of  future chemotherapy treatments. She does not remember her conversation with Oncology but knows that she is going to start on chemotherapy once discharged, as this is something that she wants.   Advanced directives, concepts specific to code status, artifical feeding and hydration, and rehospitalization were discussed minimally as she did not want to embrace this conversation. She did agree that her mother would be her 32 and she would like information on advance directives.    Questions and concerns were addressed. The patient was encouraged to call with questions or concerns.   Primary Decision Maker: Patient is able to make decisions, however she did verbalize that her mother would be her HCPOA if needed.     SUMMARY OF RECOMMENDATIONS    Maintain FULL code status   Diaudid PRN for pain control  Re-assess pain control in 24 hours and if pain seems to remain uncontrolled would consider Dilaudid PCA   Phenergan PRN for nausea/vomiting   Will adjust bowel regimen for constipation relief  Palliative team will continue to support patient, family, and providers during hospitalization  Would consider Palliative to follow up post-discharge at [facility] for symptom management and support    Code Status/Advance Care Planning:  Full code    Symptom Management:   Dilaudid every hour PRN for pain control  Phenergan PRN for nausea/vomiting   Senna increased to 2 tablets BID and Miralax scheduled BID for constipation   Ensure as ordered by nutrition for nutritional support   Palliative Prophylaxis:   Bowel Regimen and Frequent Pain Assessment  Additional Recommendations (Limitations, Scope, Preferences):  Full Scope Treatment  Psycho-social/Spiritual:   Desire for further Chaplaincy support:Yes for advance directives   Prognosis:   Guarded with poor prognosis given widespread malignancy   Discharge Planning: To Be Determined      Primary Diagnoses: Present on  Admission: . Malignant ascites . Abdominal distention   I have reviewed the medical record, interviewed the patient and family, and examined the patient. The following aspects are pertinent.  Past Medical History:  Diagnosis Date  . Ascites   . Bronchitis   . Colon cancer ([place])   . Pregnancy induced hypertension   . S/P colostomy [facility])    Social History   Socioeconomic History  . Marital status: Single    Spouse name: None  . Number of children: None  . Years of education: None  . Highest education level: None  Social Needs  . Financial resource strain: None  . Food insecurity - worry: None  . Food insecurity - inability: None  . Transportation needs - medical: None  . Transportation needs - non-medical: None  Occupational History  . None  Tobacco Use  . Smoking status: Never Smoker  . Smokeless tobacco: Never Used  Substance and Sexual Activity  . Alcohol use: No  . Drug use: No  . Sexual activity: Yes    Birth control/protection: None  Other Topics Concern  . None  Social History Narrative  . None   History reviewed. No pertinent family history. Scheduled Meds: . apixaban  5 mg Oral BID  . ciprofloxacin  500 mg Oral BID  . feeding supplement (ENSURE ENLIVE)  237 mL Oral BID BM  . fentaNYL  75 mcg Transdermal Q72H  . lidocaine      . polyethylene glycol  17 g Oral Daily  . senna-docusate  1 tablet Oral QHS   Continuous Infusions: PRN Meds:.HYDROmorphone (DILAUDID) injection, morphine, ondansetron (ZOFRAN) IV, promethazine, sodium chloride flush Medications Prior to Admission:  Prior to Admission medications   Medication Sig Start Date End Date Taking? Authorizing Provider  ELIQUIS 5 MG TABS tablet Take 5 mg by mouth 2 (two) times daily. 08/06/17  Yes [provider]  fentaNYL (DURAGESIC - DOSED MCG/HR) 50 MCG/HR Place 50 mcg onto the skin every 3 (three) days.   Yes [provider]  haloperidol (HALDOL) 0.5 MG tablet Take 0.5 mg by mouth 2  (two) times daily as needed. 07/20/17  Yes [provider]  morphine (MSIR) 15 MG tablet Take 15 mg by mouth every 2 (two) hours as needed. 08/06/17  Yes [provider]  potassium chloride (K-DUR) 10 MEQ tablet Take 10 mEq by mouth daily.   Yes [provider]   No Known Allergies Review of Systems  Constitutional: Positive for appetite change, fatigue and unexpected weight change.  Gastrointestinal: Positive for abdominal distention, abdominal pain and constipation.  Musculoskeletal:       Generalized weakness     Physical Exam  Constitutional: She appears lethargic. She appears cachectic. She is cooperative. She appears ill.  Pulmonary/Chest: Effort normal.  Abdominal: She exhibits distension.  Mildly distended s/p Paracentesis   Musculoskeletal:       Right shoulder: She exhibits decreased strength.  Neurological: She appears lethargic.    Vital Signs: BP 119/76 (BP Location: Right Arm)   Pulse (!) 134   Temp 97.6 F (36.4 C) (Oral)   Resp 16   Ht 5' 3"  (1.6 m)   Wt 66.2 kg (145 lb 15.1 oz)   SpO2 100%   BMI 25.85 kg/m  Pain Assessment: 0-10 POSS *See Group Information*: 1-Acceptable,Awake and alert Pain Score: 8    SpO2: SpO2: 100 % O2 Device:SpO2: 100 % O2 Flow Rate: .O2 Flow Rate (L/min): 0 L/min  IO: Intake/output summary:   Intake/Output Summary (Last 24 hours) at 08/15/2017 1445 Last data filed at 08/15/2017 1400 Gross per 24 hour  Intake 720 ml  Output 1 ml  Net 719 ml    LBM: Last BM Date: 08/14/17 Baseline Weight: Weight: 63.5 kg (140 lb) Most recent weight: Weight: 66.2 kg (145 lb 15.1 oz)     Palliative Assessment/Data: PPS 40%    Time In: 1400 Time Out: 1500 Time Total: 60 min   Greater than 50%  of this time was spent counseling and coordinating care related to the above assessment and plan.  Signed by: Alda Lea, NP-C Palliative Medicine Team  Phone: [phone] Fax: [phone]   Please  contact Palliative Medicine Team phone at [phone] for questions and concerns.  For individual provider: See Amion   PMT Addendum: Patient seen and examined along with Ms Cousar NP Agree with above note and assessment 27 yo diagnosed with met rectal ca,  s/p colostomy, now here at [facility] for second opinion on her cancer care. Has a mother, has a 49 ear old and a 3 year old. We discussed about appropriate pain and non pain symptom management, recommend chaplain consult for completion of adv directives.  Asleep after her paracentesis earlier today, is able to awaken and respond some what. Maintaining the work of her breathing, is oriented. No family at bedside Labs vitals med history and care everywhere reviewed and noted.  Asleep but awakens easily No distress Weak appears frail Regular S1 S2 No edema Muscle wasting abd distension less since paracentesis according to patient PMT to follow 60 minutes spent.

## 2017-08-15 NOTE — Progress Notes (Signed)
   08/15/17 1455  Clinical Encounter Type  Visited With Patient  Visit Type Initial  Spiritual Encounters  Spiritual Needs Prayer   Rounding on Palliative Patients.  Patient was alone and lightly sleeping.  She was alone, indicated her aunt had stopped earlier, but she, the patient, had a hard time staying awake.  Said her mom would be by when she gets off work.  Patient was calm and seemed comfortable.  We prayed together and she thanked me for the visit.  Will follow as needed. Chaplain Katherene Ponto

## 2017-08-15 NOTE — Progress Notes (Signed)
   08/15/17 1600  Clinical Encounter Type  Visited With Patient  Visit Type Follow-up  Spiritual Encounters  Spiritual Needs Literature   After initial visit a Scc for information on a HCPA.  Gave the information to the patient and she asked that I just leave it for now.  Let her know when she is ready to have the nurse page the Chaplain and we are happy to assist her in completing. Will follow as needed. Chaplain Katherene Ponto

## 2017-08-16 ENCOUNTER — Inpatient Hospital Stay
Admit: 2017-08-16 | Discharge: 2017-08-16 | Disposition: A | Discharge status: Home / Self Care | Payer: Self-pay | Attending: Oncology | Admitting: Oncology

## 2017-08-16 DIAGNOSIS — C20 Malignant neoplasm of rectum: Secondary | ICD-10-CM

## 2017-08-16 DIAGNOSIS — Z923 Personal history of irradiation: Secondary | ICD-10-CM

## 2017-08-16 DIAGNOSIS — C787 Secondary malignant neoplasm of liver and intrahepatic bile duct: Principal | ICD-10-CM

## 2017-08-16 DIAGNOSIS — R140 Abdominal distension (gaseous): Secondary | ICD-10-CM

## 2017-08-16 DIAGNOSIS — D649 Anemia, unspecified: Secondary | ICD-10-CM

## 2017-08-16 DIAGNOSIS — R1084 Generalized abdominal pain: Secondary | ICD-10-CM

## 2017-08-16 DIAGNOSIS — Z86718 Personal history of other venous thrombosis and embolism: Secondary | ICD-10-CM

## 2017-08-16 DIAGNOSIS — Z515 Encounter for palliative care: Secondary | ICD-10-CM

## 2017-08-16 DIAGNOSIS — Z7901 Long term (current) use of anticoagulants: Secondary | ICD-10-CM

## 2017-08-16 DIAGNOSIS — Z800 Family history of malignant neoplasm of digestive organs: Secondary | ICD-10-CM

## 2017-08-16 DIAGNOSIS — C786 Secondary malignant neoplasm of retroperitoneum and peritoneum: Secondary | ICD-10-CM

## 2017-08-16 DIAGNOSIS — R180 Malignant ascites: Secondary | ICD-10-CM

## 2017-08-16 DIAGNOSIS — Z9221 Personal history of antineoplastic chemotherapy: Secondary | ICD-10-CM

## 2017-08-16 DIAGNOSIS — R000 Tachycardia, unspecified: Secondary | ICD-10-CM

## 2017-08-16 LAB — CULTURE, BODY FLUID W GRAM STAIN -BOTTLE: Culture: NO GROWTH

## 2017-08-16 LAB — CULTURE, BLOOD (ROUTINE X 2)
CULTURE: NO GROWTH
Culture: NO GROWTH
Special Requests: ADEQUATE
Special Requests: ADEQUATE

## 2017-08-16 LAB — CBC
HEMATOCRIT: 23.9 % — AB (ref 36.0–46.0)
Hemoglobin: 7.7 g/dL — ABNORMAL LOW (ref 12.0–15.0)
MCH: 26.6 pg (ref 26.0–34.0)
MCHC: 32.2 g/dL (ref 30.0–36.0)
MCV: 82.4 fL (ref 78.0–100.0)
PLATELETS: 274 10*3/uL (ref 150–400)
RBC: 2.9 MIL/uL — AB (ref 3.87–5.11)
RDW: 16.8 % — ABNORMAL HIGH (ref 11.5–15.5)
WBC: 9.7 10*3/uL (ref 4.0–10.5)

## 2017-08-16 LAB — CULTURE, BODY FLUID-BOTTLE

## 2017-08-16 MED ORDER — NALOXONE HCL 0.4 MG/ML IJ SOLN: 0.4000 mg | INTRAMUSCULAR | Status: DC | PRN

## 2017-08-16 MED ORDER — DIPHENHYDRAMINE HCL 12.5 MG/5ML PO ELIX: 12.5000 mg | ORAL_SOLUTION | Freq: Four times a day (QID) | ORAL | Status: DC | PRN

## 2017-08-16 MED ORDER — DIPHENHYDRAMINE HCL 50 MG/ML IJ SOLN: 12.5000 mg | Freq: Four times a day (QID) | INTRAMUSCULAR | Status: DC | PRN

## 2017-08-16 MED ORDER — SODIUM CHLORIDE 0.9% FLUSH: 9.0000 mL | INTRAVENOUS | Status: DC | PRN

## 2017-08-16 MED ORDER — HYDROMORPHONE 1 MG/ML IV SOLN
INTRAVENOUS | Status: DC
  Administered 2017-08-16: 3.79 mg via INTRAVENOUS
  Administered 2017-08-16: 17:00:00 via INTRAVENOUS
  Administered 2017-08-17: 1.74 mg via INTRAVENOUS
  Administered 2017-08-17: 2.99 mg via INTRAVENOUS
  Administered 2017-08-17: 2.09 mg via INTRAVENOUS
  Administered 2017-08-17: 1.11 mg via INTRAVENOUS
  Filled 2017-08-16: qty 25

## 2017-08-16 NOTE — Progress Notes (Signed)
IP PROGRESS NOTE  Subjective:   Amber Silva underwent a paracentesis for 4.8 L of fluid yesterday.  She continues to have abdominal pain, chiefly in the right lower abdomen.  She is using IV Dilaudid frequently.  She feels like she needs to have a bowel movement.  Objective: Vital signs in last 24 hours: Blood pressure 99/65, pulse (!) 132, temperature 97.9 F (36.6 C), temperature source Oral, resp. rate 18, height _0  (1.6 m), weight 145 lb 15.1 oz (66.2 kg), SpO2 100 %.  Intake/Output from previous day: 02/11 0701 - 02/12 0700 In: 76 [P.O.:840] Out: 34 [Urine:400; Stool:1]  Physical Exam: Lungs: Clear anteriorly, no respiratory distress Cardiac: Tachycardia, regular rhythm Abdomen: Distended, tender in the right lower abdomen with firmness, no discrete mass, left lower quadrant colostomy Extremities: Trace edema throughout the left leg  Portacath/PICC-without erythema  Lab Results: Recent Labs    08/15/17 0357 08/16/17 0423  WBC 10.5 9.7  HGB 7.9* 7.7*  HCT 24.6* 23.9*  PLT 291 274    BMET Recent Labs    08/14/17 0409  NA 128*  K 4.3  CL 97*  CO2 25  GLUCOSE 99  BUN 10  CREATININE 0.64  CALCIUM 8.2*    No results found for: CEA1  Studies/Results: US Paracentesis  Result Date: 08/15/2017 INDICATION: Patient with history of metastatic rectal cancer, recurrent ascites. Request made for therapeutic paracentesis up to 6 liters. EXAM: ULTRASOUND GUIDED THERAPEUTIC PARACENTESIS MEDICATIONS: None. COMPLICATIONS: None immediate. PROCEDURE: Informed written consent was obtained from the patient after a discussion of the risks, benefits and alternatives to treatment. A timeout was performed prior to the initiation of the procedure. Initial ultrasound scanning demonstrates a large amount of ascites within the left mid to lower abdominal quadrant. The left mid to lower abdomen was prepped and draped in the usual sterile fashion. 1% lidocaine was used for local  anesthesia. Following this, a Yueh catheter was introduced. An ultrasound image was saved for documentation purposes. The paracentesis was performed. The catheter was removed and a dressing was applied. The patient tolerated the procedure well without immediate post procedural complication. FINDINGS: A total of approximately 4.8 liters of bloody fluid was removed. IMPRESSION: Successful ultrasound-guided therapeutic paracentesis yielding 4.8 liters of peritoneal fluid. Read by: Rowe Robert, PA-C Electronically Signed   By: Markus Daft M.D.   On: 08/15/2017 12:35    Medications: I have reviewed the patient's current medications.  Assessment/Plan: 1. Rectal cancer July 2018   Colonoscopy 01/25/2017-biopsy showed invasive moderately differentiated adenocarcinoma with mucinous features.    CT chest/abdomen/pelvis 02/11/2017-very large ulcerated rectal mass with severe wall thickening over the distal 11.2 cm of the rectum extending to the anus with surrounding perirectal adenopathy and pelvic sidewall adenopathy.  At least 2 metastatic lesions in the right hepatic lobe.   MRI of the pelvis 02/14/2017-T4b, N2 bulky rectal mass with deep infiltrative invasion of the bilateral pelvic sidewalls, presacral space, mesorectal fascia, distal branches of the internal iliac vasculature and sacral plexus.  There was abnormal signal intensity along both sciatic nerves possibly representing perineural extension.  The mass was noted to approximate the posterior vaginal wall without definite invasion.    PET scan 02/15/2017-colorectal malignancy with mesorectal fascia infiltration and 2 FDG avid hepatic metastases.  Regional lymphadenopathy found on prior imaging was not FDG avid on the PET scan.    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   Foundation 1-KRAS/NRAS wild-type; microsatellite stable; tumor mutational burden 3; BRCA1 rearrangement intron 2  4 cycles of FOLFIRINOX 03/09/2017 through 04/27/2017.    CT  abdomen/pelvis 05/06/2017-large rectal mass and known hepatic metastases similar to recent comparison.    Status post partial course of radiation/Xeloda.   CTs 07/12/2017-large necrotic invasive rectal mass; increased size of known liver metastatic lesions; interval increase in size of peritoneal implants; new development of moderate volume abdominal ascites; gas again present within the anterior rectal wall closely opposing the gas-filled vagina.  Rectovaginal fistula not excluded.    Admitted to [facility] 08/12/2017 with abdominal pain and swelling  CT abdomen/pelvis 08/11/2017-large rectal mass that appeared necrotic and with discrete tract extending toward the vagina/perineum; interval development of massive ascites; diffuse caking of the omentum; descending colostomy bulging due to peritoneal metastases; hepatic metastases measuring up to 4.7 cm in the upper right liver; large thrombus in the left femoral and common femoral veins. 2. Left lower extremity DVT.  On Eliquis. 3. Ascites secondary to #1. 4. Tachycardia-likely secondary to anemia, intravascular depletion, critical illness   Amber Silva has metastatic rectal cancer.  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  She is being followed by the palliative care medicine service.  She has not received single modality systemic therapy since October 2018.  The plan at [facility] was to begin salvage therapy with FOLFIRI/panitumumab.  Amber Silva has a poor performance status.  I think it will be difficult for her to be discharged and return for outpatient chemotherapy.  We will consider initiating FOLFIRI/panitumumab as an inpatient.  I reviewed the potential toxicities associated with this regimen including the chance for hematologic toxicity, mucositis, diarrhea, and alopecia.  We reviewed the rash associated with panitumumab.  She agrees to proceed with FOLFIRI/Panitumumab  therapy.  Recommendations: 1.  Pain management per palliative care medicine, add oral narcotic for breakthrough pain 2.  Palliative paracentesis as needed 3.  FOLFIRI/panitumumab to begin within the next few days 4.  Bowel regimen   LOS: 4 days   Betsy Coder, MD   08/16/2017, 2:33 PM

## 2017-08-16 NOTE — Progress Notes (Signed)
Medications administered by student RN [id number] with supervision of Clinical Instructor Caelin Rayl MSN, RN-BC or patient's assigned RN.   

## 2017-08-16 NOTE — Progress Notes (Signed)
Report called to [facility] on 3W.  Pt aware of transfer to 1335 and agreeable.

## 2017-08-16 NOTE — Progress Notes (Signed)
PROGRESS NOTE  Amber Silva XBD:[id number] DOB: 1991-06-02 DOA: 08/11/2017 PCP: [name]., MD   LOS: 4 days   Brief Narrative / Interim history:  27 y.o. 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  She has recently developed malignant ascites with her first paracentesis last week, and at that time she was also diagnosed with SBP and placed on ciprofloxacin.  She was found to have DVT recently and she did not Eliquis since.  There are concerns based on [facility] notes that she has pericardial metastasis.  Assessment & Plan: Active Problems:   Malignant ascites   Abdominal distention   Hyponatremia   Malnutrition of moderate degree   Metastatic rectal cancer -With extensive peritoneal metastatic disease, hepatic metastatic disease, concern for pericardial metastasis, with ascites.  Recently diagnosed with SBP on ciprofloxacin -Evaluated by Dr. Benay Spice, discussed with him this morning, given progressive recurrent ascites and required for paracentesis every 2 days in the past 4 days, this is rapidly progressive, recommending inpatient immunotherapy  Malignant ascites / recent history of spontaneous bacterial peritonitis -Status post large-volume paracentesis on 2/9, 5 L removed, repeat paracentesis 2/11, almost another 5 L removed -Significant pain, palliative care following  Protein calorie malnutrition -Dietary consult  Recent DVT -Extensive DVT noted even on the CT scan of the left femoral and common femoral veins.   -Continue Eliquis  Hyponatremia -Overall stable, sodium  has not been repeated for couple of days, will repeat in the morning  Sinus tachycardia -TSH slightly elevated.  Her tachycardia was worked up at [facility] without any clear cause but thought to be due to progressive cancer -Free T4 1.25  Chronic pain -Palliative following  Goals of care -She is a full code     DVT prophylaxis: Eliquis Code Status: Full code Family Communication: no family at bedside Disposition Plan: Transfer to [address] for chemo  Consultants:   Oncology - Dr. Benay Spice  Procedures:   US guided paracentesis 2/9  US guided paracentesis 2/11  Antimicrobials:  Ciprofloxacin 2/8 >>   Subjective: -Complains of ongoing pain, no nausea or vomiting today, poor appetite -No chest pain, no shortness of breath  Objective: Vitals:   08/15/17 2135 08/16/17 0539 08/16/17 0851 08/16/17 1340  BP: 109/73 108/73 123/88 99/65  Pulse: (!) 134 (!) 135 (!) 133 (!) 132  Resp: 16 16 18 18   Temp: 98.7 F (37.1 C) 98.8 F (37.1 C) 98.9 F (37.2 C) 97.9 F (36.6 C)  TempSrc: Oral Oral Oral Oral  SpO2: 100% 100% 99% 100%  Weight:      Height:        Intake/Output Summary (Last 24 hours) at 08/16/2017 1438 Last data filed at 08/16/2017 1100 Gross per 24 hour  Intake 830 ml  Output 551 ml  Net 279 ml   Filed Weights   08/11/17 0753 08/12/17 1700 08/14/17 0553  Weight: 63.5 kg (140 lb) 67.7 kg (149 lb 4 oz) 66.2 kg (145 lb 15.1 oz)    Examination:  Constitutional: NAD Eyes: no scleral icterus  ENMT: mmm Neck: normal supple Respiratory: CTA biL, no wheezing, no crackled  Cardiovascular: RRR without murmurs, good pulses Abdomen:  soft, non distended, ostomy bag in place Skin: no rashes Neurologic: non -focal Psychiatric: Normal judgment and insight. Alert and oriented x 3. Normal mood.    Data Reviewed: I have independently reviewed following labs and imaging studies   CBC: Recent Labs  Lab 08/11/17 0850 08/13/17 0350 08/14/17 0409 08/15/17 0357  08/16/17 0423  WBC 9.5 11.8* 9.3 10.5 9.7  NEUTROABS 8.8*  --   --   --   --   HGB 9.1* 8.2* 7.1* 7.9* 7.7*  HCT 28.5* 25.7* 21.9* 24.6* 23.9*  MCV 81.9 82.4 81.7 82.6 82.4  PLT 547* 415* 285 291 151   Basic Metabolic Panel: Recent Labs  Lab 08/11/17 0850 08/13/17 0350 08/14/17 0409  NA 128* 128* 128*  K 5.0 4.9 4.3  CL 92* 97* 97*  CO2 23 23 25   GLUCOSE 105* 106* 99  BUN 16 14 10   CREATININE 0.93 0.87 0.64  CALCIUM 9.2 8.9 8.2*  MG  --  2.1 1.9  PHOS  --  3.9 3.0   GFR: Estimated Creatinine Clearance: 96.6 mL/min (by C-G formula based on SCr of 0.64 mg/dL). Liver Function Tests: Recent Labs  Lab 08/11/17 0850 08/13/17 0350 08/14/17 0409  AST 23 19 22   ALT 8* 9* 8*  ALKPHOS 131* 139* 108  BILITOT 0.7 0.4 0.2*  PROT 7.4 6.7 5.6*  ALBUMIN 2.1* 1.9* 1.9*   No results for input(s): LIPASE, AMYLASE in the last 168 hours. No results for input(s): AMMONIA in the last 168 hours. Coagulation Profile: Recent Labs  Lab 08/11/17 0850  INR 1.52   Cardiac Enzymes: No results for input(s): CKTOTAL, CKMB, CKMBINDEX, TROPONINI in the last 168 hours. BNP (last 3 results) No results for input(s): PROBNP in the last 8760 hours. HbA1C: No results for input(s): HGBA1C in the last 72 hours. CBG: Recent Labs  Lab 08/11/17 1126  GLUCAP 111*   Lipid Profile: No results for input(s): CHOL, HDL, LDLCALC, TRIG, CHOLHDL, LDLDIRECT in the last 72 hours. Thyroid Function Tests: Recent Labs    08/14/17 0409  FREET4 1.25*  T3FREE 2.1   Anemia Panel: No results for input(s): VITAMINB12, FOLATE, FERRITIN, TIBC, IRON, RETICCTPCT in the last 72 hours. Urine analysis:    Component Value Date/Time   COLORURINE YELLOW 04/17/2017 1611   APPEARANCEUR CLEAR 04/17/2017 1611   LABSPEC 1.010 04/17/2017 1611   PHURINE 7.0 04/17/2017 1611   GLUCOSEU NEGATIVE 04/17/2017 1611   HGBUR NEGATIVE 04/17/2017 1611   BILIRUBINUR NEGATIVE 04/17/2017 1611   KETONESUR NEGATIVE 04/17/2017 1611    PROTEINUR NEGATIVE 04/17/2017 1611   UROBILINOGEN >8.0 (H) 02/11/2014 1125   NITRITE NEGATIVE 04/17/2017 1611   LEUKOCYTESUR NEGATIVE 04/17/2017 1611   Sepsis Labs: Invalid input(s): PROCALCITONIN, LACTICIDVEN  Recent Results (from the past 240 hour(s))  Blood Culture (routine x 2)     Status: None   Collection Time: 08/11/17  8:45 AM  Result Value Ref Range Status   Specimen Description   Final    BLOOD RIGHT ANTECUBITAL Performed at [facility], [address] [place], [place] [address]    Special Requests   Final    BOTTLES DRAWN AEROBIC AND ANAEROBIC Blood Culture adequate volume Performed at [facility], [address] [place], [place] [address]    Culture   Final    NO GROWTH 5 DAYS Performed at [facility], [place] [address] [place], [place] [address]    Report Status 08/16/2017 FINAL  Final  Blood Culture (  routine x 2)     Status: None   Collection Time: 08/11/17  8:55 AM  Result Value Ref Range Status   Specimen Description   Final    BLOOD LEFT ANTECUBITAL Performed at [facility], [address] [place], [place] [address]    Special Requests   Final    BOTTLES DRAWN AEROBIC AND ANAEROBIC Blood Culture adequate volume Performed at [facility], [address] [place], [place] [address]    Culture   Final    NO GROWTH 5 DAYS Performed at [facility], [place] [address], [place], [place] [address]    Report Status 08/16/2017 FINAL  Final  Culture, body fluid-bottle     Status: None   Collection Time: 08/11/17 10:35 AM  Result Value Ref Range Status   Specimen Description ASCITIC  Final   Special Requests NONE  Final   Culture   Final    NO GROWTH 5 DAYS Performed at [place] [address] [place], [place] [address]    Report Status 08/16/2017 FINAL  Final  Gram stain     Status: None   Collection Time: 08/11/17 10:35 AM  Result Value Ref Range Status   Specimen Description ASCITIC   Final   Special Requests NONE  Final   Gram Stain   Final    NO WBC SEEN NO ORGANISMS SEEN Performed at [facility], [place] [address], [place], [place] [address]    Report Status 08/11/2017 FINAL  Final  Culture, body fluid-bottle     Status: None (Preliminary result)   Collection Time: 08/13/17 10:15 AM  Result Value Ref Range Status   Specimen Description PERITONEAL  Final   Special Requests NONE  Final   Culture   Final    NO GROWTH 3 DAYS Performed at [place] [address], [place], [place] [address]    Report Status PENDING  Incomplete  Gram stain     Status: None   Collection Time: 08/13/17 10:15 AM  Result Value Ref Range Status   Specimen Description PERITONEAL  Final   Special Requests NONE  Final   Gram Stain   Final    FEW WBC PRESENT,BOTH PMN AND MONONUCLEAR NO ORGANISMS SEEN Performed at [facility], [address] [place], [place] [address]    Report Status 08/13/2017 FINAL  Final      Radiology Studies: US Paracentesis  Result Date: 08/15/2017 INDICATION: Patient with history of metastatic rectal cancer, recurrent ascites. Request made for therapeutic paracentesis up to 6 liters. EXAM: ULTRASOUND GUIDED THERAPEUTIC PARACENTESIS MEDICATIONS: None. COMPLICATIONS: None immediate. PROCEDURE: Informed written consent was obtained from the patient after a discussion of the risks, benefits and alternatives to treatment. A timeout was performed prior to the initiation of the procedure. Initial ultrasound scanning demonstrates a large amount of ascites within the left mid to lower abdominal quadrant. The left mid to lower abdomen was prepped and draped in the usual sterile fashion. 1% lidocaine was used for local anesthesia. Following this, a Yueh catheter was introduced. An ultrasound image was saved for documentation purposes. The paracentesis was performed. The catheter was removed and a dressing was applied. The patient tolerated the procedure  well without immediate post procedural complication. FINDINGS: A total of approximately 4.8 liters of bloody fluid was removed. IMPRESSION: Successful ultrasound-guided therapeutic paracentesis yielding 4.8 liters of peritoneal fluid. Read by: Rowe Robert, PA-C Electronically Signed  By: Markus Daft M.D.   On: 08/15/2017 12:35     Scheduled Meds: . apixaban  5 mg Oral BID  . ciprofloxacin  500 mg Oral BID  . feeding supplement (ENSURE ENLIVE)  237 mL Oral BID BM  . fentaNYL  75 mcg Transdermal Q72H  . polyethylene glycol  17 g Oral BID  . senna-docusate  2 tablet Oral BID   Continuous Infusions:    Marzetta Board, MD, PhD [facility] Pager [phone]  If 7PM-7AM, please contact night-coverage [email or web address] Password [facility] 08/16/2017, 2:38 PM

## 2017-08-16 NOTE — Progress Notes (Signed)
Pt arrived on floor with c/o pain for which PCA pump is in use. No other complaints at this time. Belongings at bedside. No distress noted.

## 2017-08-16 NOTE — Progress Notes (Signed)
Daily Progress Note   Patient Name: Amber Silva       Date: 08/16/2017 DOB: 06-27-1991  Age: 27 y.o. MRN#: [id number] Attending Physician: Caren Griffins, MD Primary Care Physician: [name]., MD Admit Date: 08/11/2017  Reason for Consultation/Follow-up: Establishing goals of care and Pain control  Subjective: I met today with [name].  We discussed her pain management at length.  Reviewed chart and discussed with her bedside RN.  She has used 16 mg of Dilaudid last 24 hours.  She reports that her pain control is "okay" but not perfect.  She continues to have pain in her abdomen and reports that has been better since having paracentesis.  We talked about options for increasing her long-acting regimen versus placing her on a PCA for another 24-48 hours in order to better gauge her overall needs.  She reports she has been on a PCA in the past but she has been at [facility] and this worked well for her pain.  Length of Stay: 4  Current Medications: Scheduled Meds:  . apixaban  5 mg Oral BID  . ciprofloxacin  500 mg Oral BID  . feeding supplement (ENSURE ENLIVE)  237 mL Oral BID BM  . fentaNYL  75 mcg Transdermal Q72H  . HYDROmorphone   Intravenous Q4H  . polyethylene glycol  17 g Oral BID  . senna-docusate  2 tablet Oral BID    Continuous Infusions:   PRN Meds: diphenhydrAMINE **OR** diphenhydrAMINE, HYDROmorphone (DILAUDID) injection, morphine, naloxone **AND** sodium chloride flush, ondansetron (ZOFRAN) IV, promethazine, sodium chloride flush  Physical Exam      Constitutional: She appears lethargic. She appears cachectic. She is cooperative. She appears ill.  Pulmonary/Chest: Effort normal.  Abdominal: She exhibits distension.  Mildly distended s/p Paracentesis    Musculoskeletal:       Right shoulder: She exhibits decreased strength.  Neurological: She appears lethargic.   Vital Signs: BP 99/65 (BP Location: Right Arm)   Pulse (!) 132   Temp 97.9 F (36.6 C) (Oral)   Resp 18   Ht 5' 3"  (1.6 m)   Wt 66.2 kg (145 lb 15.1 oz)   SpO2 100%   BMI 25.85 kg/m  SpO2: SpO2: 100 % O2 Device: O2 Device: Not Delivered O2 Flow Rate: O2 Flow Rate (L/min): 0 L/min  Intake/output summary:   Intake/Output Summary (Last 24 hours) at 08/16/2017 1639 Last data filed at 08/16/2017 1100 Gross per 24 hour  Intake 830 ml  Output 351 ml  Net 479 ml   LBM: Last BM Date: 08/16/17 Baseline Weight: Weight: 63.5 kg (140 lb) Most recent weight: Weight: 66.2 kg (145 lb 15.1 oz)       Palliative Assessment/Data:      Patient Active Problem List   Diagnosis Date Noted  . Malnutrition of moderate degree 08/15/2017  . Malignant ascites 08/12/2017  . Abdominal distention 08/12/2017  . Hyponatremia 08/12/2017    Palliative Care Assessment & Plan   Patient Profile: 27 y.o. female  admitted on 08/11/2017 with abdominal pain and swelling. She has a PMH of metastatic rectal cancer, ascites, s/p colostomy.  She has been receiving Oncology treatments and care at [facility]. She was initially diagnosed with rectal cancer July 2018. She underwent a loop ileostomy on 02/16/2017 and completed 4 cycles of FOLFIRNOX with last treatment 04/27/2017. She completed a partial course of radiation and Xeloda to pelvic area. Recent CT abdomen/pelvis showed a large rectal mass that appeared necrotic and with discrete tract extending toward the vagina/perineum; interval development of massive ascites; diffuse caking of the omentum; descending colostomy bulging due to peritoneal metastases; hepatic metastasesmeasuring up to 4.7 cm in the upper right liver; large thrombus in the left femoral and common femoral veins.   Assessment: Patient Active Problem List    Diagnosis Date Noted  . Malnutrition of moderate degree 08/15/2017  . Malignant ascites 08/12/2017  . Abdominal distention 08/12/2017  . Hyponatremia 08/12/2017   Recommendations/Plan:  Full code  Plan for inpatient immunotherapy  Will plan for initiation of PCA to get pain under better control while determining overall needs.  Has used PCA in the past at [facility] with good results.  Plan for dilaudid PCA with 0.24m basal and 0.324mbolus with 15 minute lockout.  Phenergan PRN for nausea/vomiting   Palliative team will continue to support patient, family, and providers during hospitalization  [facility]   Goals of Care and Additional Recommendations:  Limitations on Scope of Treatment: Full Scope Treatment  Code Status:    Code Status Orders  (From admission, onward)        Start     Ordered   08/12/17 1753  Full code  Continuous     08/12/17 1753    Code Status History    Date Active Date Inactive Code Status Order ID Comments User Context   This patient has a current code status but no historical code status.       Prognosis:   Guarded  Discharge Planning:  To Be Determined  Care plan was discussed with patient, RN  Thank you for allowing the Palliative Medicine Team to assist in the care of this patient.   Total Time 40 Prolonged Time Billed No      Greater than 50%  of this time was spent counseling and coordinating care related to the above assessment and plan.  GeMicheline RoughMD  Please contact Palliative Medicine Team phone at [phone] questions and concerns.

## 2017-08-17 ENCOUNTER — Encounter: Payer: Self-pay | Admitting: General Practice

## 2017-08-17 ENCOUNTER — Inpatient Hospital Stay
Admit: 2017-08-17 | Discharge: 2017-08-17 | Disposition: A | Discharge status: Home / Self Care | Payer: Self-pay | Attending: Oncology | Admitting: Oncology

## 2017-08-17 DIAGNOSIS — C787 Secondary malignant neoplasm of liver and intrahepatic bile duct: Principal | ICD-10-CM

## 2017-08-17 DIAGNOSIS — C189 Malignant neoplasm of colon, unspecified: Secondary | ICD-10-CM

## 2017-08-17 DIAGNOSIS — R180 Malignant ascites: Secondary | ICD-10-CM

## 2017-08-17 LAB — CBC
HEMATOCRIT: 24 % — AB (ref 36.0–46.0)
Hemoglobin: 7.9 g/dL — ABNORMAL LOW (ref 12.0–15.0)
MCH: 26.7 pg (ref 26.0–34.0)
MCHC: 32.9 g/dL (ref 30.0–36.0)
MCV: 81.1 fL (ref 78.0–100.0)
Platelets: 279 10*3/uL (ref 150–400)
RBC: 2.96 MIL/uL — ABNORMAL LOW (ref 3.87–5.11)
RDW: 16.8 % — AB (ref 11.5–15.5)
WBC: 9.2 10*3/uL (ref 4.0–10.5)

## 2017-08-17 MED ORDER — HYDROMORPHONE BOLUS VIA INFUSION
0.5000 mg | INTRAVENOUS | Status: DC | PRN
  Administered 2017-08-17: 0.5 mg via INTRAVENOUS
  Filled 2017-08-17: qty 1

## 2017-08-17 MED ORDER — LACTULOSE 10 GM/15ML PO SOLN
20.0000 g | Freq: Two times a day (BID) | ORAL | Status: DC
  Administered 2017-08-17 – 2017-08-18 (×3): 20 g via ORAL
  Filled 2017-08-17 (×4): qty 30

## 2017-08-17 MED ORDER — HYDROMORPHONE 1 MG/ML IV SOLN
INTRAVENOUS | Status: DC
  Administered 2017-08-17: 21:00:00 via INTRAVENOUS
  Administered 2017-08-17: 2.7 mg via INTRAVENOUS
  Administered 2017-08-17: 8.19 mg via INTRAVENOUS
  Administered 2017-08-18: 4.43 mg via INTRAVENOUS
  Administered 2017-08-18: 5.42 mg via INTRAVENOUS
  Administered 2017-08-18: 3.88 mg via INTRAVENOUS
  Administered 2017-08-18: 4.02 mg via INTRAVENOUS
  Administered 2017-08-18: 2.83 mg via INTRAVENOUS
  Administered 2017-08-18: 2.94 mg via INTRAVENOUS
  Administered 2017-08-19: 2.51 mg via INTRAVENOUS
  Administered 2017-08-19: 3.66 mg via INTRAVENOUS
  Administered 2017-08-19: 4.61 mg via INTRAVENOUS
  Administered 2017-08-19: 3.71 mg via INTRAVENOUS
  Filled 2017-08-17 (×2): qty 25

## 2017-08-17 MED ORDER — PROMETHAZINE HCL 25 MG/ML IJ SOLN
12.5000 mg | Freq: Once | INTRAMUSCULAR | Status: AC
  Administered 2017-08-17: 12.5 mg via INTRAVENOUS
  Filled 2017-08-17: qty 1

## 2017-08-17 NOTE — Progress Notes (Signed)
Daily Progress Note   Patient Name: Amber Silva       Date: 08/17/2017 DOB: May 09, 1991  Age: 27 y.o. MRN#: [id number] Attending Physician: Elodia Florence., * Primary Care Physician: [name]., MD Admit Date: 08/11/2017  Reason for Consultation/Follow-up: Establishing goals of care and Pain control  Subjective: Reviewed chart and discussed with her bedside RN.    Ms. Zingaro has been stating that PCA is not working well and she has therefore also gotten several boluses of dilaudid 2mg  prn.    On examination, she was awake but sleepy.  Reports pain currently well controlled (received 2mg  dilaudid 1 hour ago).  She has used 12 mg of Dilaudid in last 24 hours via PCA.  Additionally, she has had another 10mg  of dilaudid from prn boluses for a total of 22mg  of dilaudid in the last 24 hours.  Her mother was at the bedside today and we discussed plan for pain management as well as plan for initiation of inpatient therapy with Dr. Benay Spice.  She is appreciative of time spent updating her.  Length of Stay: 5  Current Medications: Scheduled Meds:  . apixaban  5 mg Oral BID  . ciprofloxacin  500 mg Oral BID  . feeding supplement (ENSURE ENLIVE)  237 mL Oral BID BM  . fentaNYL  75 mcg Transdermal Q72H  . HYDROmorphone   Intravenous Q4H  . lactulose  20 g Oral BID  . polyethylene glycol  17 g Oral BID  . senna-docusate  2 tablet Oral BID    Continuous Infusions:   PRN Meds: diphenhydrAMINE **OR** diphenhydrAMINE, HYDROmorphone, morphine, naloxone **AND** sodium chloride flush, ondansetron (ZOFRAN) IV, promethazine, sodium chloride flush  Physical Exam      Constitutional: She appears lethargic. She appears cachectic. She is cooperative. She appears ill.  Pulmonary/Chest:  Effort normal.  Abdominal: She exhibits distension.  Mildly distended s/p Paracentesis   Musculoskeletal:       Right shoulder: She exhibits decreased strength.  Neurological: She appears lethargic.   Vital Signs: BP 112/71 (BP Location: Left Arm)   Pulse (!) 132   Temp 97.9 F (36.6 C) (Oral)   Resp 20   Ht 5\' 3"  (1.6 m)   Wt 66.2 kg (145 lb 15.1 oz)   SpO2 100%   BMI 25.85 kg/m  SpO2:  SpO2: 100 % O2 Device: O2 Device: Not Delivered O2 Flow Rate: O2 Flow Rate (L/min): 0 L/min  Intake/output summary:   Intake/Output Summary (Last 24 hours) at 08/17/2017 1505 Last data filed at 08/17/2017 1200 Gross per 24 hour  Intake 715 ml  Output 150 ml  Net 565 ml   LBM: Last BM Date: (Pt stated no BM) Baseline Weight: Weight: 63.5 kg (140 lb) Most recent weight: Weight: 66.2 kg (145 lb 15.1 oz)       Palliative Assessment/Data:      Patient Active Problem List   Diagnosis Date Noted  . Malnutrition of moderate degree 08/15/2017  . Malignant ascites 08/12/2017  . Abdominal distention 08/12/2017  . Hyponatremia 08/12/2017    Palliative Care Assessment & Plan   Patient Profile: 27 y.o. female  admitted on 08/11/2017 with abdominal pain and swelling. She has a PMH of metastatic rectal cancer, ascites, s/p colostomy.  She has been receiving Oncology treatments and care at [facility]. She was initially diagnosed with rectal cancer July 2018. She underwent a loop ileostomy on 02/16/2017 and completed 4 cycles of FOLFIRNOX with last treatment 04/27/2017. She completed a partial course of radiation and Xeloda to pelvic area. Recent CT abdomen/pelvis showed a large rectal mass that appeared necrotic and with discrete tract extending toward the vagina/perineum; interval development of massive ascites; diffuse caking of the omentum; descending colostomy bulging due to peritoneal metastases; hepatic metastasesmeasuring up to 4.7 cm in the upper right liver; large  thrombus in the left femoral and common femoral veins.   Assessment: Patient Active Problem List   Diagnosis Date Noted  . Malnutrition of moderate degree 08/15/2017  . Malignant ascites 08/12/2017  . Abdominal distention 08/12/2017  . Hyponatremia 08/12/2017   Recommendations/Plan:  Full code  Plan for inpatient immunotherapy  Will continue to titrate PCA to get pain under better control while determining overall needs.  She has gotten 22mg  of dilaudid in the last 24 hours.  Plan for dilaudid basal rate 0.5mg  with 0.5mg  bolus with 15 minute lockout.  Will also leave lower dose of prn dilaudid (0.5mg ) for now as well.  Phenergan PRN for nausea/vomiting   Palliative team will continue to support patient, family, and providers during hospitalization  Would consider Palliative to follow up [facility]-discharge at [facility] for symptom management and support   Goals of Care and Additional Recommendations:  Limitations on Scope of Treatment: Full Scope Treatment  Code Status:    Code Status Orders  (From admission, onward)        Start     Ordered   08/12/17 1753  Full code  Continuous     08/12/17 1753    Code Status History    Date Active Date Inactive Code Status Order ID Comments User Context   This patient has a current code status but no historical code status.       Prognosis:   Guarded  Discharge Planning:  To Be Determined  Care plan was discussed with patient, RN  Thank you for allowing the Palliative Medicine Team to assist in the care of this patient.   Total Time 40 Prolonged Time Billed No      Greater than 50%  of this time was spent counseling and coordinating care related to the above assessment and plan.  Micheline Rough, MD  Please contact Palliative Medicine Team phone at [phone] for questions and concerns.

## 2017-08-17 NOTE — Progress Notes (Signed)
PROGRESS NOTE    Amber Silva  VQQ:[id number] DOB: [birth year] DOA: 08/11/2017 PCP: [name]., MD   Brief Narrative:  27 y.o.femalewith medical history significant ofstage IV adenocarcinoma of the rectum status post sigmoid colostomy, chemotherapy, palliative radiation therapy, cancer metastasized tothe liverand peritoneal metastatic disease, with massive malignant ascites, who gets all of her care at [facility] and is looking for Shadasia Oldfield second opinion here at [facility] health, presents to the emergency room with progressive abdominal distention and discomfort.  She has recently developed malignant ascites with her first paracentesis last week, and at that time she was also diagnosed with SBP and placed on ciprofloxacin.  She was found to have DVT recently and she did not Eliquis since.  There are concerns based on [facility] notes that she has pericardial metastasis.  Assessment & Plan:   Active Problems:   Malignant ascites   Abdominal distention   Hyponatremia   Malnutrition of moderate degree  Metastatic rectal cancer -With extensive peritoneal metastatic disease, hepatic metastatic disease, concern for pericardial metastasis, with ascites.  Recently diagnosed with SBP on ciprofloxacin -Evaluated by Dr. Benay Spice, plan for FOLFIRI/irinotecan inpatient given her significant symptoms - Palliative care following, appreciate assistance with symptom control  Malignant ascites/recent history of spontaneous bacterial peritonitis -Status post large-volume paracentesis on 2/9, 5 L removed  - repeat paracentesis 2/11, almost another 5 L removed - she's reaccumulated fluid rapidly, will plan for another paracentesis by IR tomorrow.  May need to think about other options like Amorita Vanrossum drain given quick reaccumulation, but on discussion with Dr. Benay Spice, these often are not as ideal for malignant ascites - she's been on cipro since 2/8 here at the hospital and looking at most recent d/c  summary it looks like it was written for her to start on 2/2.  I have not seen any culture data, though per her d/c summary, looks like she did have greater than 250 neutrophils.  Will plan on d/c abx after today.   -Significant pain, palliative care following  Protein calorie malnutrition -Dietary consult  Recent DVT -Extensive DVT noted even on the CT scan of the left femoral and common femoral veins. -Continue Eliquis  Hyponatremia -Overall stable, sodium has not been repeated for couple of days, will repeat tomorrow  Sinus tachycardia -TSH slightly elevated.  Her tachycardia was worked up at [facility] without any clear cause but thought to be due to progressive cancer -Free T4 1.25, unclear significance of TFT's in setting of her acute illness, consider repeating this as outpatient - continue to monitor  Chronic pain -Palliative following  Goals of care -She is Donnisha Besecker full code  DVT prophylaxis: eliquis Code Status: full  Family Communication: mother at bedside Disposition Plan: pending    Consultants:   Palliative Care  Oncology  Procedures:   US guided paracentesis 2/9  US guided paracentesis 2/11  Antimicrobials: Anti-infectives (From admission, onward)   Start     Dose/Rate Route Frequency Ordered Stop   08/12/17 2000  ciprofloxacin (CIPRO) tablet 500 mg     500 mg Oral 2 times daily 08/12/17 1800 08/18/17 0759       Subjective: Worsening abdominal distension. No pain. SOB when getting up and about.    Objective: Vitals:   08/17/17 0241 08/17/17 0448 08/17/17 0803 08/17/17 0900  BP:  108/73  107/80  Pulse:  (!) 138  (!) 138  Resp: 17 16 14  (!) 21  Temp:  97.7 F (36.5 C)  98.2 F (36.8  C)  TempSrc:  Oral  Oral  SpO2: 97% 99% 100% 100%  Weight:      Height:        Intake/Output Summary (Last 24 hours) at 08/17/2017 1103 Last data filed at 08/17/2017 0900 Gross per 24 hour  Intake 715 ml  Output 0 ml  Net 715 ml   Filed Weights     08/11/17 0753 08/12/17 1700 08/14/17 0553  Weight: 63.5 kg (140 lb) 67.7 kg (149 lb 4 oz) 66.2 kg (145 lb 15.1 oz)    Examination:  General exam: Appears calm and comfortable  Respiratory system: Clear to auscultation. Respiratory effort normal. Cardiovascular system: Tachycardic.  S1 & S2 heard, RRR. No JVD, murmurs, rubs, gallops or clicks. No pedal edema. Gastrointestinal system: Abdomen is tensely distended, tender to palpation.  Colostomy without notable output. No organomegaly or masses felt.  Central nervous system: Alert and oriented. No focal neurological deficits. Extremities: Symmetric 5 x 5 power. Skin: No rashes, lesions or ulcers Psychiatry: Judgement and insight appear normal. Mood & affect appropriate.     Data Reviewed: I have personally reviewed following labs and imaging studies  CBC: Recent Labs  Lab 08/11/17 0850 08/13/17 0350 08/14/17 0409 08/15/17 0357 08/16/17 0423 08/17/17 0500  WBC 9.5 11.8* 9.3 10.5 9.7 9.2  NEUTROABS 8.8*  --   --   --   --   --   HGB 9.1* 8.2* 7.1* 7.9* 7.7* 7.9*  HCT 28.5* 25.7* 21.9* 24.6* 23.9* 24.0*  MCV 81.9 82.4 81.7 82.6 82.4 81.1  PLT 547* 415* 285 291 274 510   Basic Metabolic Panel: Recent Labs  Lab 08/11/17 0850 08/13/17 0350 08/14/17 0409  NA 128* 128* 128*  K 5.0 4.9 4.3  CL 92* 97* 97*  CO2 23 23 25   GLUCOSE 105* 106* 99  BUN 16 14 10   CREATININE 0.93 0.87 0.64  CALCIUM 9.2 8.9 8.2*  MG  --  2.1 1.9  PHOS  --  3.9 3.0   GFR: Estimated Creatinine Clearance: 96.6 mL/min (by C-G formula based on SCr of 0.64 mg/dL). Liver Function Tests: Recent Labs  Lab 08/11/17 0850 08/13/17 0350 08/14/17 0409  AST 23 19 22   ALT 8* 9* 8*  ALKPHOS 131* 139* 108  BILITOT 0.7 0.4 0.2*  PROT 7.4 6.7 5.6*  ALBUMIN 2.1* 1.9* 1.9*   No results for input(s): LIPASE, AMYLASE in the last 168 hours. No results for input(s): AMMONIA in the last 168 hours. Coagulation Profile: Recent Labs  Lab 08/11/17 0850  INR  1.52   Cardiac Enzymes: No results for input(s): CKTOTAL, CKMB, CKMBINDEX, TROPONINI in the last 168 hours. BNP (last 3 results) No results for input(s): PROBNP in the last 8760 hours. HbA1C: No results for input(s): HGBA1C in the last 72 hours. CBG: Recent Labs  Lab 08/11/17 1126  GLUCAP 111*   Lipid Profile: No results for input(s): CHOL, HDL, LDLCALC, TRIG, CHOLHDL, LDLDIRECT in the last 72 hours. Thyroid Function Tests: No results for input(s): TSH, T4TOTAL, FREET4, T3FREE, THYROIDAB in the last 72 hours. Anemia Panel: No results for input(s): VITAMINB12, FOLATE, FERRITIN, TIBC, IRON, RETICCTPCT in the last 72 hours. Sepsis Labs: Recent Labs  Lab 08/11/17 0847  LATICACIDVEN 1.46    Recent Results (from the past 240 hour(s))  Blood Culture (routine x 2)     Status: None   Collection Time: 08/11/17  8:45 AM  Result Value Ref Range Status   Specimen Description   Final    BLOOD RIGHT  ANTECUBITAL Performed at [facility], [address] [place], [place] [address]    Special Requests   Final    BOTTLES DRAWN AEROBIC AND ANAEROBIC Blood Culture adequate volume Performed at [facility], [address] [place], [place] [address]    Culture   Final    NO GROWTH 5 DAYS Performed at [facility], [place] [address], [place], [place] [address]    Report Status 08/16/2017 FINAL  Final  Blood Culture (routine x 2)     Status: None   Collection Time: 08/11/17  8:55 AM  Result Value Ref Range Status   Specimen Description   Final    BLOOD LEFT ANTECUBITAL Performed at [facility], [address] [place], [place] [address]    Special Requests   Final    BOTTLES DRAWN AEROBIC AND ANAEROBIC Blood Culture adequate volume Performed at [facility], [address] [place], [place] [address]    Culture   Final    NO GROWTH 5 DAYS Performed at [facility], [place] [address] [place], [place] [address]    Report Status  08/16/2017 FINAL  Final  Culture, body fluid-bottle     Status: None   Collection Time: 08/11/17 10:35 AM  Result Value Ref Range Status   Specimen Description ASCITIC  Final   Special Requests NONE  Final   Culture   Final    NO GROWTH 5 DAYS Performed at [facility] [address] [place], [place] [address]    Report Status 08/16/2017 FINAL  Final  Gram stain     Status: None   Collection Time: 08/11/17 10:35 AM  Result Value Ref Range Status   Specimen Description ASCITIC  Final   Special Requests NONE  Final   Gram Stain   Final    NO WBC SEEN NO ORGANISMS SEEN Performed at [facility], [place] [address] [place], [place] [address]    Report Status 08/11/2017 FINAL  Final  Culture, body fluid-bottle     Status: None (Preliminary result)   Collection Time: 08/13/17 10:15 AM  Result Value Ref Range Status   Specimen Description PERITONEAL  Final   Special Requests NONE  Final   Culture   Final    NO GROWTH 3 DAYS Performed at [place] [address] [place], [place] [address]    Report Status PENDING  Incomplete  Gram stain     Status: None   Collection Time: 08/13/17 10:15 AM  Result Value Ref Range Status   Specimen Description PERITONEAL  Final   Special Requests NONE  Final   Gram Stain   Final    FEW WBC PRESENT,BOTH PMN AND MONONUCLEAR NO ORGANISMS SEEN Performed at [facility], [address], [place], [place] [address]    Report Status 08/13/2017 FINAL  Final         Radiology Studies: US Paracentesis  Result Date: 08/15/2017 INDICATION: Patient with history of metastatic rectal cancer, recurrent ascites. Request made for therapeutic paracentesis up to 6 liters. EXAM: ULTRASOUND GUIDED THERAPEUTIC PARACENTESIS MEDICATIONS: None. COMPLICATIONS: None immediate. PROCEDURE: Informed written consent was obtained from the patient after Annesha Delgreco discussion of the risks, benefits and alternatives to treatment. Langston Summerfield timeout was performed  prior to the initiation of the procedure. Initial ultrasound scanning demonstrates Delphia Kaylor large amount of ascites within the left mid to lower abdominal quadrant. The  left mid to lower abdomen was prepped and draped in the usual sterile fashion. 1% lidocaine was used for local anesthesia. Following this, Yaira Bernardi Yueh catheter was introduced. An ultrasound image was saved for documentation purposes. The paracentesis was performed. The catheter was removed and Benoit Meech dressing was applied. The patient tolerated the procedure well without immediate post procedural complication. FINDINGS: Ramelo Oetken total of approximately 4.8 liters of bloody fluid was removed. IMPRESSION: Successful ultrasound-guided therapeutic paracentesis yielding 4.8 liters of peritoneal fluid. Read by: Rowe Robert, PA-C Electronically Signed   By: Markus Daft M.D.   On: 08/15/2017 12:35        Scheduled Meds: . apixaban  5 mg Oral BID  . ciprofloxacin  500 mg Oral BID  . feeding supplement (ENSURE ENLIVE)  237 mL Oral BID BM  . fentaNYL  75 mcg Transdermal Q72H  . HYDROmorphone   Intravenous Q4H  . lactulose  20 g Oral BID  . polyethylene glycol  17 g Oral BID  . senna-docusate  2 tablet Oral BID   Continuous Infusions:   LOS: 5 days    Time spent: over 30 min    Fayrene Helper, MD [facility] Pager [phone]  If 7PM-7AM, please contact night-coverage [email or web address] Password [facility] 08/17/2017, 11:03 AM

## 2017-08-17 NOTE — Progress Notes (Signed)
IP PROGRESS NOTE  Subjective:   Ms. Carberry continues to have pain.  She reports exertional dyspnea.  She relates the dyspnea to abdominal distention.  No bowel movement. Objective: Vital signs in last 24 hours: Blood pressure 108/73, pulse (!) 138, temperature 97.7 F (36.5 C), temperature source Oral, resp. rate 14, height 5' 3"  (1.6 m), weight 145 lb 15.1 oz (66.2 kg), SpO2 100 %.  Intake/Output from previous day: 02/12 0701 - 02/13 0700 In: 1065 [P.O.:1065] Out: 150 [Urine:150]  Physical Exam: Lungs: Decreased breath sounds at the right lower chest, no respiratory distress Cardiac: Tachycardia, regular rhythm Abdomen: Distended, tender in the right lower abdomen with firmness, no discrete mass, left lower quadrant colostomy without stool Extremities: Trace edema throughout the left leg  Portacath/PICC-without erythema  Lab Results: Recent Labs    08/16/17 0423 08/17/17 0500  WBC 9.7 9.2  HGB 7.7* 7.9*  HCT 23.9* 24.0*  PLT 274 279    BMET No results for input(s): NA, K, CL, CO2, GLUCOSE, BUN, CREATININE, CALCIUM in the last 72 hours.  No results found for: CEA1  Studies/Results: US Paracentesis  Result Date: 08/15/2017 INDICATION: Patient with history of metastatic rectal cancer, recurrent ascites. Request made for therapeutic paracentesis up to 6 liters. EXAM: ULTRASOUND GUIDED THERAPEUTIC PARACENTESIS MEDICATIONS: None. COMPLICATIONS: None immediate. PROCEDURE: Informed written consent was obtained from the patient after a discussion of the risks, benefits and alternatives to treatment. A timeout was performed prior to the initiation of the procedure. Initial ultrasound scanning demonstrates a large amount of ascites within the left mid to lower abdominal quadrant. The left mid to lower abdomen was prepped and draped in the usual sterile fashion. 1% lidocaine was used for local anesthesia. Following this, a Yueh catheter was introduced. An ultrasound image was saved for  documentation purposes. The paracentesis was performed. The catheter was removed and a dressing was applied. The patient tolerated the procedure well without immediate post procedural complication. FINDINGS: A total of approximately 4.8 liters of bloody fluid was removed. IMPRESSION: Successful ultrasound-guided therapeutic paracentesis yielding 4.8 liters of peritoneal fluid. Read by: Rowe Robert, PA-C Electronically Signed   By: Markus Daft M.D.   On: 08/15/2017 12:35    Medications: I have reviewed the patient's current medications.  Assessment/Plan: 1. Rectal cancer July 2018   Colonoscopy 01/25/2017-biopsy showed invasive moderately differentiated adenocarcinoma with mucinous features.    CT chest/abdomen/pelvis 02/11/2017-very large ulcerated rectal mass with severe wall thickening over the distal 11.2 cm of the rectum extending to the anus with surrounding perirectal adenopathy and pelvic sidewall adenopathy.  At least 2 metastatic lesions in the right hepatic lobe.   MRI of the pelvis 02/14/2017-T4b, N2 bulky rectal mass with deep infiltrative invasion of the bilateral pelvic sidewalls, presacral space, mesorectal fascia, distal branches of the internal iliac vasculature and sacral plexus.  There was abnormal signal intensity along both sciatic nerves possibly representing perineural extension.  The mass was noted to approximate the posterior vaginal wall without definite invasion.    PET scan 02/15/2017-colorectal malignancy with mesorectal fascia infiltration and 2 FDG avid hepatic metastases.  Regional lymphadenopathy found on prior imaging was not FDG avid on the PET scan.    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   Foundation 1-KRAS/NRAS wild-type; microsatellite stable; tumor mutational burden 3; BRCA1 rearrangement intron 2  4 cycles of FOLFIRINOX 03/09/2017 through 04/27/2017.    CT abdomen/pelvis 05/06/2017-large rectal mass and known hepatic metastases similar to recent  comparison.    Status post  partial course of radiation/Xeloda.   CTs 07/12/2017-large necrotic invasive rectal mass; increased size of known liver metastatic lesions; interval increase in size of peritoneal implants; new development of moderate volume abdominal ascites; gas again present within the anterior rectal wall closely opposing the gas-filled vagina.  Rectovaginal fistula not excluded.    Admitted to [facility] 08/12/2017 with abdominal pain and swelling  CT abdomen/pelvis 08/11/2017-large rectal mass that appeared necrotic and with discrete tract extending toward the vagina/perineum; interval development of massive ascites; diffuse caking of the omentum; descending colostomy bulging due to peritoneal metastases; hepatic metastases measuring up to 4.7 cm in the upper right liver; large thrombus in the left femoral and common femoral veins. 2. Left lower extremity DVT.  On Eliquis. 3. Ascites secondary to #1. 4. Tachycardia-likely secondary to anemia, intravascular depletion, critical illness   Ms. Fairclough has metastatic rectal cancer.  I discussed the prognosis and treatment options with Ms. Goelz, her mother, and aunt.  They understand no therapy will be curative.  Her symptoms are related to progression of metastatic rectal cancer.  I recommend proceeding with FOLFIRI/irinotecan as planned by the [facility] oncology team.  We reviewed the potential toxicities associated with this regimen including the chance for alopecia, mucositis, diarrhea, and hematologic toxicity.  We discussed the rash and allergic reaction associated with panitumumab.  She agrees to proceed.  She understands the increased risk of toxicity from systemic therapy secondary to her malnutrition and poor performance status.  She is currently maintained on a Dilaudid PCA for pain control.  The palliative care service is following her.   Recommendations: 1.  Pain management per palliative care medicine, assess pain  needs and work toward an outpatient pain regimen 2.  Palliative paracentesis as needed 3.  FOLFIRI/panitumumab to begin 08/18/2017 4.  Adjust bowel regimen 5.  Social work evaluation for home care needs   LOS: 5 days   Betsy Coder, MD   08/17/2017, 8:50 AM

## 2017-08-17 NOTE — Progress Notes (Signed)
  Oncology Nurse Navigator Documentation  Navigator Location: [facility] (08/17/17 1157) Referral date to RadOnc/MedOnc: 08/15/17 (08/17/17 1157) )                        Treatment Phase: Active Tx (08/17/17 1157)     Interventions: Referrals (08/17/17 1157) Referrals: Social Work (08/17/17 5825)  Referral to Social Work placed to assess for support services.        Acuity: Level 2 (08/17/17 1157)   Acuity Level 2: Referrals such as genetics, survivorship;Ongoing guidance and education throughout treatment as needed (08/17/17 1157)     Time Spent with Patient: 15 (08/17/17 1157)

## 2017-08-17 NOTE — Care Management Note (Signed)
Case Management Note  Patient Details  Name: Amber Silva MRN: [id number] Date of Birth: Jan 31, 1991  Subjective/Objective:   27 yo admitted with Malignant Ascites. Hx of rectal cancer.                 Action/Plan: From home with Mom and plans to DC back there. Pt requesting home health services, 3in1 and RW. Choice offered for home health services and [facility] chosen. [facility] called for referral. Will need MD orders prior to DC.  Expected Discharge Date:                  Expected Discharge Plan:  [place]  In-House Referral:     Discharge planning Services  CM Consult  Post Acute Care Choice:  Home Health Choice offered to:  Patient  DME Arranged:  3-N-1, [place] rolling DME Agency:  [place]:  RN [facility] Agency:  [place]  Status of Service:  In process, will continue to follow  If discussed at Long Length of Stay Meetings, dates discussed:    Additional CommentsLynnell Catalan, RN 08/17/2017, 12:49 PM  [phone]

## 2017-08-17 NOTE — Progress Notes (Signed)
Amber Silva CSW Progress Note  CSW received referral from oncology nurse navigator for social support and resources assessment.  CSW visited w patient while inpatient.  Patient somewhat lethargic but able to discuss concerns/needs.  Primary concern is pain which is being addressed by medical team.  Other primary concern is finding appropriate care for her two young children (ages 54 and 87).  Patient has recently moved in w mother who provides significant support.  However, mother's work hours are 6:30 - 2:30.  Mother has been unable to work due to need to assist w care for children due to patient's illness,weakness and frequent hospitalizations.  Patient has requested childcare vouchers from [place] (worker is [employer]); has been informed that vouchers are only available for people in work or Water [employer].  Vouchers are not available for compassionate reasons such as patient's current illness.  Patient asked that CSW investigate possible options for child care.  Daughter (age 59) attends [employer]; "she had a major breakdown at school while the teacher was teaching a lesson."  Patient's mother informed school of seriousness of patient's illness, requested additional support be provided to child.  Patient remains hopeful, "I am still here."  Reports that she is able to engage in quiet activities w children including reading, watching TV and playing games.  CSW spoke w [place] re options for Early [employer] ([place] site is near to patient's home).  CSW will try to facilitate the application process.  CSw spoke w grandmother who states that other family members can assist w transport for one year old if child care placement can be found.  CSW will continue to assist as needed.  Edwyna Shell, LCSW Clinical Social Worker Phone:  [phone]

## 2017-08-18 ENCOUNTER — Inpatient Hospital Stay (HOSPITAL_COMMUNITY): Payer: Medicaid Other

## 2017-08-18 ENCOUNTER — Encounter: Payer: Self-pay | Admitting: General Practice

## 2017-08-18 ENCOUNTER — Telehealth: Payer: Self-pay | Admitting: General Practice

## 2017-08-18 DIAGNOSIS — C20 Malignant neoplasm of rectum: Secondary | ICD-10-CM

## 2017-08-18 DIAGNOSIS — R0600 Dyspnea, unspecified: Secondary | ICD-10-CM

## 2017-08-18 LAB — CBC
HCT: 24.7 % — ABNORMAL LOW (ref 36.0–46.0)
HEMOGLOBIN: 8 g/dL — AB (ref 12.0–15.0)
MCH: 26.3 pg (ref 26.0–34.0)
MCHC: 32.4 g/dL (ref 30.0–36.0)
MCV: 81.3 fL (ref 78.0–100.0)
Platelets: 303 10*3/uL (ref 150–400)
RBC: 3.04 MIL/uL — AB (ref 3.87–5.11)
RDW: 17.1 % — ABNORMAL HIGH (ref 11.5–15.5)
WBC: 9.6 10*3/uL (ref 4.0–10.5)

## 2017-08-18 LAB — BODY FLUID CELL COUNT WITH DIFFERENTIAL
Lymphs, Fluid: 45 %
Monocyte-Macrophage-Serous Fluid: 17 % — ABNORMAL LOW (ref 50–90)
Neutrophil Count, Fluid: 38 % — ABNORMAL HIGH (ref 0–25)
Total Nucleated Cell Count, Fluid: 440 cu mm (ref 0–1000)

## 2017-08-18 LAB — MAGNESIUM: Magnesium: 1.8 mg/dL (ref 1.7–2.4)

## 2017-08-18 LAB — CULTURE, BODY FLUID W GRAM STAIN -BOTTLE: Culture: NO GROWTH

## 2017-08-18 LAB — GRAM STAIN

## 2017-08-18 LAB — CULTURE, BODY FLUID-BOTTLE

## 2017-08-18 LAB — COMPREHENSIVE METABOLIC PANEL
ALK PHOS: 154 U/L — AB (ref 38–126)
ALT: 9 U/L — AB (ref 14–54)
ANION GAP: 11 (ref 5–15)
AST: 25 U/L (ref 15–41)
Albumin: 1.5 g/dL — ABNORMAL LOW (ref 3.5–5.0)
BUN: 12 mg/dL (ref 6–20)
CO2: 22 mmol/L (ref 22–32)
CREATININE: 0.76 mg/dL (ref 0.44–1.00)
Calcium: 8.2 mg/dL — ABNORMAL LOW (ref 8.9–10.3)
Chloride: 93 mmol/L — ABNORMAL LOW (ref 101–111)
Glucose, Bld: 119 mg/dL — ABNORMAL HIGH (ref 65–99)
Potassium: 5.1 mmol/L (ref 3.5–5.1)
Sodium: 126 mmol/L — ABNORMAL LOW (ref 135–145)
TOTAL PROTEIN: 5.8 g/dL — AB (ref 6.5–8.1)
Total Bilirubin: 0.2 mg/dL — ABNORMAL LOW (ref 0.3–1.2)

## 2017-08-18 LAB — OSMOLALITY: Osmolality: 264 mOsm/kg — ABNORMAL LOW (ref 275–295)

## 2017-08-18 MED ORDER — DIPHENHYDRAMINE HCL 50 MG/ML IJ SOLN
50.0000 mg | Freq: Once | INTRAMUSCULAR | Status: DC | PRN
Start: 2017-08-18 — End: 2017-08-23

## 2017-08-18 MED ORDER — SODIUM CHLORIDE 0.9 % IV SOLN
6.0000 mg/kg | Freq: Once | INTRAVENOUS | Status: AC
  Administered 2017-08-18: 360 mg via INTRAVENOUS
  Filled 2017-08-18: qty 18

## 2017-08-18 MED ORDER — ALBUTEROL SULFATE (2.5 MG/3ML) 0.083% IN NEBU: 2.5000 mg | INHALATION_SOLUTION | Freq: Once | RESPIRATORY_TRACT | Status: DC | PRN

## 2017-08-18 MED ORDER — HEPARIN SOD (PORK) LOCK FLUSH 100 UNIT/ML IV SOLN: 250.0000 [IU] | Freq: Once | INTRAVENOUS | Status: DC | PRN

## 2017-08-18 MED ORDER — ALTEPLASE 2 MG IJ SOLR
2.0000 mg | Freq: Once | INTRAMUSCULAR | Status: DC | PRN
Start: 2017-08-18 — End: 2017-08-23
  Filled 2017-08-18: qty 2

## 2017-08-18 MED ORDER — HEPARIN SOD (PORK) LOCK FLUSH 100 UNIT/ML IV SOLN
500.0000 [IU] | Freq: Once | INTRAVENOUS | Status: DC | PRN
  Filled 2017-08-18: qty 5

## 2017-08-18 MED ORDER — SODIUM CHLORIDE 0.9% FLUSH: 10.0000 mL | INTRAVENOUS | Status: DC | PRN

## 2017-08-18 MED ORDER — FAMOTIDINE IN NACL 20-0.9 MG/50ML-% IV SOLN
20.0000 mg | Freq: Once | INTRAVENOUS | Status: DC | PRN
  Filled 2017-08-18: qty 50

## 2017-08-18 MED ORDER — SODIUM CHLORIDE 0.9% FLUSH: 3.0000 mL | INTRAVENOUS | Status: DC | PRN

## 2017-08-18 MED ORDER — LIDOCAINE HCL 1 % IJ SOLN
INTRAMUSCULAR | Status: AC
  Filled 2017-08-18: qty 10

## 2017-08-18 MED ORDER — COLD PACK MISC ONCOLOGY
1.0000 | Freq: Once | Status: AC | PRN
  Filled 2017-08-18: qty 1

## 2017-08-18 MED ORDER — EPINEPHRINE PF 1 MG/ML IJ SOLN
0.5000 mg | Freq: Once | INTRAMUSCULAR | Status: DC | PRN
  Filled 2017-08-18: qty 1

## 2017-08-18 MED ORDER — MAGNESIUM SULFATE 2 GM/50ML IV SOLN
2.0000 g | Freq: Once | INTRAVENOUS | Status: AC
  Administered 2017-08-18: 2 g via INTRAVENOUS
  Filled 2017-08-18: qty 50

## 2017-08-18 MED ORDER — SODIUM CHLORIDE 0.9 % IV SOLN: Freq: Once | INTRAVENOUS | Status: DC

## 2017-08-18 MED ORDER — FLUOROURACIL CHEMO INJECTION 5 GM/100ML
1800.0000 mg/m2 | Freq: Once | INTRAVENOUS | Status: AC
  Administered 2017-08-18: 2950 mg via INTRAVENOUS
  Filled 2017-08-18: qty 59

## 2017-08-18 MED ORDER — IRINOTECAN HCL CHEMO INJECTION 100 MG/5ML
125.0000 mg/m2 | Freq: Once | INTRAVENOUS | Status: AC
  Administered 2017-08-18: 200 mg via INTRAVENOUS
  Filled 2017-08-18 (×2): qty 10

## 2017-08-18 MED ORDER — EPINEPHRINE PF 1 MG/10ML IJ SOSY: 0.2500 mg | PREFILLED_SYRINGE | Freq: Once | INTRAMUSCULAR | Status: DC | PRN

## 2017-08-18 MED ORDER — SODIUM CHLORIDE 0.9 % IV SOLN
10.0000 mg | Freq: Once | INTRAVENOUS | Status: AC
  Administered 2017-08-18: 10 mg via INTRAVENOUS
  Filled 2017-08-18: qty 1

## 2017-08-18 MED ORDER — HYDROCORTISONE 1 % EX CREA: TOPICAL_CREAM | Freq: Four times a day (QID) | CUTANEOUS | Status: DC | PRN

## 2017-08-18 MED ORDER — ALBUMIN HUMAN 25 % IV SOLN
25.0000 g | Freq: Once | INTRAVENOUS | Status: AC
  Administered 2017-08-18: 25 g via INTRAVENOUS
  Filled 2017-08-18 (×2): qty 100

## 2017-08-18 MED ORDER — DOXYCYCLINE HYCLATE 100 MG PO TABS
100.0000 mg | ORAL_TABLET | Freq: Two times a day (BID) | ORAL | Status: DC
  Administered 2017-08-18 – 2017-08-23 (×11): 100 mg via ORAL
  Filled 2017-08-18 (×11): qty 1

## 2017-08-18 MED ORDER — DEXTROSE 5 % IV SOLN
200.0000 mg/m2 | Freq: Once | INTRAVENOUS | Status: AC
  Administered 2017-08-18: 330 mg via INTRAVENOUS
  Filled 2017-08-18: qty 6.5

## 2017-08-18 MED ORDER — DIPHENHYDRAMINE HCL 50 MG/ML IJ SOLN: 25.0000 mg | Freq: Once | INTRAMUSCULAR | Status: DC | PRN

## 2017-08-18 MED ORDER — METHYLPREDNISOLONE SODIUM SUCC 125 MG IJ SOLR
125.0000 mg | Freq: Once | INTRAMUSCULAR | Status: DC | PRN
Start: 2017-08-18 — End: 2017-08-23

## 2017-08-18 MED ORDER — SODIUM CHLORIDE 0.9 % IV SOLN: Freq: Once | INTRAVENOUS | Status: DC | PRN

## 2017-08-18 MED ORDER — ATROPINE SULFATE 1 MG/ML IJ SOLN
0.5000 mg | Freq: Once | INTRAMUSCULAR | Status: AC | PRN
  Administered 2017-08-18: 0.5 mg via INTRAVENOUS
  Filled 2017-08-18 (×2): qty 0.5

## 2017-08-18 MED ORDER — PALONOSETRON HCL INJECTION 0.25 MG/5ML
0.2500 mg | Freq: Once | INTRAVENOUS | Status: AC
  Administered 2017-08-18: 0.25 mg via INTRAVENOUS
  Filled 2017-08-18: qty 5

## 2017-08-18 NOTE — Procedures (Signed)
Ultrasound-guided diagnostic and therapeutic paracentesis performed yielding 4.4 liters of bloody  fluid. No immediate complications.  A portion of the fluid was submitted to the lab for preordered studies.

## 2017-08-18 NOTE — Progress Notes (Signed)
PROGRESS NOTE    Amber Silva  DGL:[id number] DOB: Nov 07, 1990 DOA: 08/11/2017 PCP: [name]., MD   Brief Narrative:  27 y.o.femalewith medical history significant ofstage IV adenocarcinoma of the rectum status post sigmoid colostomy, chemotherapy, palliative radiation therapy, cancer metastasized tothe liverand peritoneal metastatic disease, with massive malignant ascites, who gets all of her care at [facility] and is looking for a second opinion here at [facility] health, presents to the emergency room with progressive abdominal distention and discomfort.  She has recently developed malignant ascites with her first paracentesis last week, and at that time she was also diagnosed with SBP and placed on ciprofloxacin.  She was found to have DVT recently and she did not Eliquis since.  There are concerns based on [facility] notes that she has pericardial metastasis.  Assessment & Plan:   Active Problems:   Malignant ascites   Abdominal distention   Hyponatremia   Malnutrition of moderate degree   Rectal cancer (HCC)  Metastatic rectal cancer -With extensive peritoneal metastatic disease, hepatic metastatic disease, concern for pericardial metastasis, with ascites.  Recently diagnosed with SBP on ciprofloxacin -Evaluated by Dr. Benay Spice, plan for FOLFIRI/Panitumumab starting 08/18/17 inpatient given her significant symptoms - Palliative care following, appreciate assistance with symptom control  Malignant ascites/recent history of spontaneous bacterial peritonitis -Status post large-volume paracentesis on 2/9, 5 L removed  - repeat paracentesis 2/11, almost another 5 L removed - 2/14 paracentesis 4.5 L removed - she's reaccumulated fluid rapidly.  May need to think about other options like a drain given quick reaccumulation, but on discussion with Dr. Benay Spice, these often are not as ideal for malignant ascites. - she's been on cipro since 2/8 here at the hospital and  looking at most recent d/c summary it looks like it was written for her to start on 2/2.  I have not seen any culture data, though per her d/c summary, looks like she did have greater than 250 neutrophils.  Abx d/c'd.   -Significant pain, palliative care following, appreciate recs (dilaudid PCA, phenergan)  Protein calorie malnutrition -Dietary consult  Recent DVT -Extensive DVT noted even on the CT scan of the left femoral and common femoral veins. -Continue Eliquis   Hyponatremia - Worsened today.  Clinically with massive ascites and likely volume overload, but possible intravascular depletion with hypoalbuminemia.  Will continue to follow.  Getting albumin today after paracentesis.  Serum osm, urine Na, urine osm.    Sinus tachycardia -TSH slightly elevated.  Her tachycardia was worked up at [facility] without any clear cause but thought to be due to progressive cancer -Free T4 1.25, unclear significance of TFT's in setting of her acute illness, consider repeating this as outpatient - continue to monitor  Chronic pain -Palliative following  Goals of care -She is a full code  DVT prophylaxis: eliquis Code Status: full  Family Communication: mother at bedside Disposition Plan: pending    Consultants:   Palliative Care  Oncology  Procedures:   US guided paracentesis 2/9  US guided paracentesis 2/11  US guided paracentesis 2/14  Antimicrobials: Anti-infectives (From admission, onward)   Start     Dose/Rate Route Frequency Ordered Stop   08/18/17 1100  doxycycline (VIBRA-TABS) tablet 100 mg     100 mg Oral Every 12 hours 08/18/17 1009     08/12/17 2000  ciprofloxacin (CIPRO) tablet 500 mg     500 mg Oral 2 times daily 08/12/17 1800 08/17/17 2012  Subjective: Feels better after paracentesis  Objective: Vitals:   08/18/17 1135 08/18/17 1352 08/18/17 1539 08/18/17 1646  BP: 102/74  107/79   Pulse:   (!) 125   Resp:  14 15 17   Temp:        TempSrc:      SpO2:  98% 100% 100%  Weight:      Height:        Intake/Output Summary (Last 24 hours) at 08/18/2017 1724 Last data filed at 08/18/2017 1400 Gross per 24 hour  Intake 400 ml  Output 200 ml  Net 200 ml   Filed Weights   08/12/17 1700 08/14/17 0553 08/18/17 0725  Weight: 67.7 kg (149 lb 4 oz) 66.2 kg (145 lb 15.1 oz) 61.6 kg (135 lb 12.8 oz)    Examination:  General: No acute distress. Cardiovascular: Heart sounds show a regular rate, and rhythm. No gallops or rubs. No murmurs. No JVD. Lungs: Clear to auscultation bilaterally with good air movement. No rales, rhonchi or wheezes. Abdomen: Soft, nontender, tense ascites, colostomy, mildly ttp.  Neurological: Alert and oriented 3. Moves all extremities 4 with equal strength. Cranial nerves II through XII grossly intact. Skin: Warm and dry. No rashes or lesions. Extremities: No clubbing or cyanosis. No edema. Psychiatric: Mood and affect are normal. Insight and judgment are appropriate.     Data Reviewed: I have personally reviewed following labs and imaging studies  CBC: Recent Labs  Lab 08/14/17 0409 08/15/17 0357 08/16/17 0423 08/17/17 0500 08/18/17 0500  WBC 9.3 10.5 9.7 9.2 9.6  HGB 7.1* 7.9* 7.7* 7.9* 8.0*  HCT 21.9* 24.6* 23.9* 24.0* 24.7*  MCV 81.7 82.6 82.4 81.1 81.3  PLT 285 291 274 279 585   Basic Metabolic Panel: Recent Labs  Lab 08/13/17 0350 08/14/17 0409 08/18/17 0500  NA 128* 128* 126*  K 4.9 4.3 5.1  CL 97* 97* 93*  CO2 23 25 22   GLUCOSE 106* 99 119*  BUN 14 10 12   CREATININE 0.87 0.64 0.76  CALCIUM 8.9 8.2* 8.2*  MG 2.1 1.9 1.8  PHOS 3.9 3.0  --    GFR: Estimated Creatinine Clearance: 87.4 mL/min (by C-G formula based on SCr of 0.76 mg/dL). Liver Function Tests: Recent Labs  Lab 08/13/17 0350 08/14/17 0409 08/18/17 0500  AST 19 22 25   ALT 9* 8* 9*  ALKPHOS 139* 108 154*  BILITOT 0.4 0.2* 0.2*  PROT 6.7 5.6* 5.8*  ALBUMIN 1.9* 1.9* 1.5*   No results for  input(s): LIPASE, AMYLASE in the last 168 hours. No results for input(s): AMMONIA in the last 168 hours. Coagulation Profile: No results for input(s): INR, PROTIME in the last 168 hours. Cardiac Enzymes: No results for input(s): CKTOTAL, CKMB, CKMBINDEX, TROPONINI in the last 168 hours. BNP (last 3 results) No results for input(s): PROBNP in the last 8760 hours. HbA1C: No results for input(s): HGBA1C in the last 72 hours. CBG: No results for input(s): GLUCAP in the last 168 hours. Lipid Profile: No results for input(s): CHOL, HDL, LDLCALC, TRIG, CHOLHDL, LDLDIRECT in the last 72 hours. Thyroid Function Tests: No results for input(s): TSH, T4TOTAL, FREET4, T3FREE, THYROIDAB in the last 72 hours. Anemia Panel: No results for input(s): VITAMINB12, FOLATE, FERRITIN, TIBC, IRON, RETICCTPCT in the last 72 hours. Sepsis Labs: No results for input(s): PROCALCITON, LATICACIDVEN in the last 168 hours.  Recent Results (from the past 240 hour(s))  Blood Culture (routine x 2)     Status: None   Collection Time: 08/11/17  8:45  AM  Result Value Ref Range Status   Specimen Description   Final    BLOOD RIGHT ANTECUBITAL Performed at [facility], [address] [place], [place] [address]    Special Requests   Final    BOTTLES DRAWN AEROBIC AND ANAEROBIC Blood Culture adequate volume Performed at [facility], [address] [place], [place] [address]    Culture   Final    NO GROWTH 5 DAYS Performed at [facility], [place] [address], [place], [place] [address]    Report Status 08/16/2017 FINAL  Final  Blood Culture (routine x 2)     Status: None   Collection Time: 08/11/17  8:55 AM  Result Value Ref Range Status   Specimen Description   Final    BLOOD LEFT ANTECUBITAL Performed at [facility], [address] [place], [place] [address]    Special Requests   Final    BOTTLES DRAWN AEROBIC AND ANAEROBIC Blood Culture adequate volume Performed at [facility], [address] [place], [place] [address]    Culture   Final    NO GROWTH 5 DAYS Performed at [facility], [place] [address] [place], [place] [address]    Report Status 08/16/2017 FINAL  Final  Culture, body fluid-bottle     Status: None   Collection Time: 08/11/17 10:35 AM  Result Value Ref Range Status   Specimen Description ASCITIC  Final   Special Requests NONE  Final   Culture   Final    NO GROWTH 5 DAYS Performed at [facility] [address], [place], [place] [address]    Report Status 08/16/2017 FINAL  Final  Gram stain     Status: None   Collection Time: 08/11/17 10:35 AM  Result Value Ref Range Status   Specimen Description ASCITIC  Final   Special Requests NONE  Final   Gram Stain   Final    NO WBC SEEN NO ORGANISMS SEEN Performed at [facility], [place] [address], [place], [place] [address]    Report Status 08/11/2017 FINAL  Final  Culture, body fluid-bottle     Status: None   Collection Time: 08/13/17 10:15 AM  Result Value Ref Range Status   Specimen Description PERITONEAL  Final   Special Requests NONE  Final   Culture   Final    NO GROWTH 5 DAYS Performed at [place] [address] [place], [place] [address]    Report Status 08/18/2017 FINAL  Final  Gram stain     Status: None   Collection Time: 08/13/17 10:15 AM  Result Value Ref Range Status   Specimen Description PERITONEAL  Final   Special Requests NONE  Final   Gram Stain   Final    FEW WBC PRESENT,BOTH PMN AND MONONUCLEAR NO ORGANISMS SEEN Performed at [facility], [address], [place], [place] [address]    Report Status 08/13/2017 FINAL  Final  Gram stain     Status: None   Collection Time: 08/18/17 11:46 AM  Result Value Ref Range Status   Specimen Description PERITONEAL  Final   Special Requests NONE  Final   Gram Stain   Final    MODERATE WBC PRESENT,BOTH PMN AND MONONUCLEAR NO ORGANISMS SEEN Performed at [place], [place] [address], [place],  [address]    Report Status 08/18/2017 FINAL  Final         Radiology Studies: US Paracentesis  Result Date: 08/18/2017 INDICATION: Patient with history of metastatic rectal cancer, recurrent ascites. Request made for diagnostic and therapeutic paracentesis. EXAM: ULTRASOUND GUIDED DIAGNOSTIC AND THERAPEUTIC PARACENTESIS MEDICATIONS: None. COMPLICATIONS: None immediate. PROCEDURE: Informed written consent was obtained from the patient after a discussion of the risks, benefits and alternatives to treatment. A timeout was performed prior to the initiation of the procedure. Initial ultrasound scanning demonstrates a large amount of ascites within the right lower abdominal quadrant. The right lower abdomen was prepped and draped in the usual sterile fashion. 1% lidocaine was used for local anesthesia. Following this, a Yueh catheter was introduced. An ultrasound image was saved for documentation purposes. The paracentesis was performed. The catheter was removed and a dressing was applied. The patient tolerated the procedure well without immediate post procedural complication. FINDINGS: A total of approximately 4.4 liters of bloody fluid was removed. Samples were sent to the laboratory as requested by the clinical team. IMPRESSION: Successful ultrasound-guided diagnostic and therapeutic paracentesis yielding 4.4 liters of peritoneal fluid. Read by: Rowe Robert, PA-C Electronically Signed   By: Sandi Mariscal M.D.   On: 08/18/2017 12:18        Scheduled Meds: . apixaban  5 mg Oral BID  . doxycycline  100 mg Oral Q12H  . feeding supplement (ENSURE ENLIVE)  237 mL Oral BID BM  . fentaNYL  75 mcg Transdermal Q72H  . FLUOROURACIL (ADRUCIL) CHEMO infusion For Inpatient Use  1,800 mg/m2 (Treatment Plan Recorded) Intravenous Once  . HYDROmorphone   Intravenous Q4H  . irinotecan (CAMPTOSAR) CHEMO IV infusion  125 mg/m2 (Treatment Plan Recorded) Intravenous Once  . lactulose   20 g Oral BID  . leucovorin (WELLCOVORIN) IV infusion  200 mg/m2 (Treatment Plan Recorded) Intravenous Once  . lidocaine      . polyethylene glycol  17 g Oral BID  . senna-docusate  2 tablet Oral BID   Continuous Infusions: . sodium chloride    . sodium chloride    . albumin human    . famotidine       LOS: 6 days    Time spent: over 30 min    Fayrene Helper, MD [facility] Pager [phone]  If 7PM-7AM, please contact night-coverage [email or web address] Password [facility] 08/18/2017, 5:24 PM

## 2017-08-18 NOTE — Progress Notes (Signed)
Daily Progress Note   Patient Name: Amber Silva       Date: 08/18/2017 DOB: [birth year]  Age: 27 y.o. MRN#: [id number] Attending Physician: Elodia Florence., * Primary Care Physician: [name]., MD Admit Date: 08/11/2017  Reason for Consultation/Follow-up: Establishing goals of care and Pain control  Subjective: Reviewed chart and discussed with her bedside RN.    On examination, she was awake but sleepy.  Reports pain currently well controlled (received 24mg  dilaudid in the last 24 hours via PCA).  Plan for initiation of chemotherapy today.  Length of Stay: 6  Current Medications: Scheduled Meds:  . apixaban  5 mg Oral BID  . doxycycline  100 mg Oral Q12H  . feeding supplement (ENSURE ENLIVE)  237 mL Oral BID BM  . fentaNYL  75 mcg Transdermal Q72H  . FLUOROURACIL (ADRUCIL) CHEMO infusion For Inpatient Use  1,800 mg/m2 (Treatment Plan Recorded) Intravenous Once  . HYDROmorphone   Intravenous Q4H  . irinotecan (CAMPTOSAR) CHEMO IV infusion  125 mg/m2 (Treatment Plan Recorded) Intravenous Once  . lactulose  20 g Oral BID  . leucovorin (WELLCOVORIN) IV infusion  200 mg/m2 (Treatment Plan Recorded) Intravenous Once  . lidocaine      . palonosetron  0.25 mg Intravenous Once  . polyethylene glycol  17 g Oral BID  . senna-docusate  2 tablet Oral BID    Continuous Infusions: . sodium chloride    . sodium chloride    . albumin human    . dexamethasone (DECADRON) IVPB [facility]    . famotidine      PRN Meds: sodium chloride, albuterol, alteplase, atropine injection [facility], Cold Pack, diphenhydrAMINE **OR** diphenhydrAMINE, diphenhydrAMINE, diphenhydrAMINE, EPINEPHrine, EPINEPHrine, EPINEPHrine, EPINEPHrine, famotidine, heparin lock flush, heparin lock flush, hydrocortisone  cream, HYDROmorphone, methylPREDNISolone sodium succinate, morphine, naloxone **AND** sodium chloride flush, ondansetron (ZOFRAN) IV, promethazine, sodium chloride flush, sodium chloride flush, sodium chloride flush  Physical Exam      Constitutional: She appears lethargic. She appears cachectic. She is cooperative. She appears ill.  Pulmonary/Chest: Effort normal.  Abdominal: She exhibits distension.  Mildly distended s/p Paracentesis   Musculoskeletal:       Right shoulder: She exhibits decreased strength.  Neurological: She appears lethargic.   Vital Signs: BP 107/79 (BP Location: Left Arm)   Pulse (!) 125   Temp  97.7 F (36.5 C) (Oral)   Resp 15   Ht 5\' 3"  (1.6 m)   Wt 61.6 kg (135 lb 12.8 oz)   SpO2 100%   BMI 24.06 kg/m  SpO2: SpO2: 100 % O2 Device: O2 Device: Not Delivered O2 Flow Rate: O2 Flow Rate (L/min): 0 L/min  Intake/output summary:   Intake/Output Summary (Last 24 hours) at 08/18/2017 1623 Last data filed at 08/18/2017 1400 Gross per 24 hour  Intake 400 ml  Output 200 ml  Net 200 ml   LBM: Last BM Date: 08/18/17 Baseline Weight: Weight: 63.5 kg (140 lb) Most recent weight: Weight: 61.6 kg (135 lb 12.8 oz)       Palliative Assessment/Data:      Patient Active Problem List   Diagnosis Date Noted  . Rectal cancer ([place]) 08/18/2017  . Malnutrition of moderate degree 08/15/2017  . Malignant ascites 08/12/2017  . Abdominal distention 08/12/2017  . Hyponatremia 08/12/2017    Palliative Care Assessment & Plan   Patient Profile: 27 y.o. female  admitted on 08/11/2017 with abdominal pain and swelling. She has a PMH of metastatic rectal cancer, ascites, s/p colostomy.  She has been receiving Oncology treatments and care at [facility]. She was initially diagnosed with rectal cancer July 2018. She underwent a loop ileostomy on 02/16/2017 and completed 4 cycles of FOLFIRNOX with last treatment 04/27/2017. She completed a partial course of  radiation and Xeloda to pelvic area. Recent CT abdomen/pelvis showed a large rectal mass that appeared necrotic and with discrete tract extending toward the vagina/perineum; interval development of massive ascites; diffuse caking of the omentum; descending colostomy bulging due to peritoneal metastases; hepatic metastasesmeasuring up to 4.7 cm in the upper right liver; large thrombus in the left femoral and common femoral veins.   Assessment: Patient Active Problem List   Diagnosis Date Noted  . Rectal cancer ([place]) 08/18/2017  . Malnutrition of moderate degree 08/15/2017  . Malignant ascites 08/12/2017  . Abdominal distention 08/12/2017  . Hyponatremia 08/12/2017   Recommendations/Plan:  Full code  Plan for inpatient immunotherapy.  Discussed this again with patient today.  Will continue PCA dilaudid basal rate 0.5mg  with 0.5mg  bolus with 15 minute lockout.  Will also leave lower dose of prn dilaudid (0.5mg ) for now as well.  Phenergan PRN for nausea/vomiting   Palliative team will continue to support patient, family, and providers during hospitalization  Would consider Palliative to follow up post-discharge at [facility] for symptom management and support   Goals of Care and Additional Recommendations:  Limitations on Scope of Treatment: Full Scope Treatment  Code Status:    Code Status Orders  (From admission, onward)        Start     Ordered   08/12/17 1753  Full code  Continuous     08/12/17 1753    Code Status History    Date Active Date Inactive Code Status Order ID Comments User Context   This patient has a current code status but no historical code status.       Prognosis:   Guarded  Discharge Planning:  To Be Determined  Care plan was discussed with patient, RN  Thank you for allowing the Palliative Medicine Team to assist in the care of this patient.   Total Time 25 Prolonged Time Billed No      Greater than 50%  of this time was spent  counseling and coordinating care related to the above assessment and plan.  Halea Lieb  Domingo Cocking, MD  Please contact Palliative Medicine Team phone at [phone] for questions and concerns.

## 2017-08-18 NOTE — Progress Notes (Signed)
[place] CSW Progress Note  Call from [employer].  Case worker will come to [employer] to have patient complete application for [employer] for 27 yo son.  CSW spoke w patient's mother, requested that she collect required documents (birth certificate, medicaid card, immunization record and proof of patient income) so these can be provided at enrollment visit.  Mother will inform patient of above.    Edwyna Shell, LCSW Clinical Social Worker Phone:  [phone]

## 2017-08-18 NOTE — Progress Notes (Signed)
IP PROGRESS NOTE  Subjective:   Amber Silva reports adequate pain control.  She is scheduled for another paracentesis today.  Her bowels are moving this morning.  She had an episode of vomiting this morning. Objective: Vital signs in last 24 hours: Blood pressure 121/88, pulse (!) 140, temperature 97.7 F (36.5 C), temperature source Oral, resp. rate (!) 23, height _0  (1.6 m), weight 135 lb 12.8 oz (61.6 kg), SpO2 98 %.  Intake/Output from previous day: 02/13 0701 - 02/14 0700 In: 400 [P.O.:400] Out: 150 [Urine:150]  Physical Exam: Lungs: Clear anteriorly, no respiratory distress Cardiac: Tachycardia, regular rhythm Abdomen: Distended, tender in the right lower abdomen with firmness, no discrete mass, left lower quadrant colostomy Ferber stool Extremities: Trace edema throughout the left leg  Portacath/PICC-without erythema  Lab Results: Recent Labs    08/17/17 0500 08/18/17 0500  WBC 9.2 9.6  HGB 7.9* 8.0*  HCT 24.0* 24.7*  PLT 279 303    BMET Recent Labs    08/18/17 0500  NA 126*  K 5.1  CL 93*  CO2 22  GLUCOSE 119*  BUN 12  CREATININE 0.76  CALCIUM 8.2*    Medications: I have reviewed the patient's current medications.  Assessment/Plan: 1. Rectal cancer July 2018   Colonoscopy 01/25/2017-biopsy showed invasive moderately differentiated adenocarcinoma with mucinous features.    CT chest/abdomen/pelvis 02/11/2017-very large ulcerated rectal mass with severe wall thickening over the distal 11.2 cm of the rectum extending to the anus with surrounding perirectal adenopathy and pelvic sidewall adenopathy.  At least 2 metastatic lesions in the right hepatic lobe.   MRI of the pelvis 02/14/2017-T4b, N2 bulky rectal mass with deep infiltrative invasion of the bilateral pelvic sidewalls, presacral space, mesorectal fascia, distal branches of the internal iliac vasculature and sacral plexus.  There was abnormal signal intensity along both sciatic nerves possibly  representing perineural extension.  The mass was noted to approximate the posterior vaginal wall without definite invasion.    PET scan 02/15/2017-colorectal malignancy with mesorectal fascia infiltration and 2 FDG avid hepatic metastases.  Regional lymphadenopathy found on prior imaging was not FDG avid on the PET scan.    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 Morton Silva.   Foundation 1-KRAS/NRAS wild-type; microsatellite stable; tumor mutational burden 3; BRCA1 rearrangement intron 2  4 cycles of FOLFIRINOX 03/09/2017 through 04/27/2017.    CT abdomen/pelvis 05/06/2017-large rectal mass and known hepatic metastases similar to recent comparison.    Status post partial course of radiation/Xeloda.   CTs 07/12/2017-large necrotic invasive rectal mass; increased size of known liver metastatic lesions; interval increase in size of peritoneal implants; new development of moderate volume abdominal ascites; gas again present within the anterior rectal wall closely opposing the gas-filled vagina.  Rectovaginal fistula not excluded.    Admitted to [facility] 08/12/2017 with abdominal pain and swelling  CT abdomen/pelvis 08/11/2017-large rectal mass that appeared necrotic and with discrete tract extending toward the vagina/perineum; interval development of massive ascites; diffuse caking of the omentum; descending colostomy bulging due to peritoneal metastases; hepatic metastases measuring up to 4.7 cm in the upper right liver; large thrombus in the left femoral and common femoral veins. 2. Left lower extremity DVT.  On Eliquis. 3. Ascites secondary to #1. 4. Tachycardia-likely secondary to anemia, intravascular depletion, critical illness   Amber Silva appears unchanged.  She continues to have pain and rapidly reaccumulating ascites.  The plan is to proceed with FOLFIRI/Panitumumab today.  I again reviewed potential toxicities associated with this regimen and  she agrees to proceed.  She understands the  increased risk of toxicity from systemic chemotherapy with her poor performance status and malnutrition.    Recommendations: 1.  Continue pain management by palliative care medicine 2.  Paracentesis today 3.  FOLFIRI/panitumumab to begin 08/18/2017    LOS: 6 days   Amber Coder, MD   08/18/2017, 10:00 AM

## 2017-08-18 NOTE — Telephone Encounter (Signed)
[place] CSW Progress Note  Spoke w Kenney Houseman [phone]) , [employer], who is contacting patient and patient's mother re referral for [employer] and/or child care assistance.  Worker will reach out to patient and/or her mother to discuss options and how to request help w child care.  CSW will also meet w patient today to discuss referral to [facility] for help w disability application.  Edwyna Shell, LCSW Clinical Social Worker Phone:  [phone]

## 2017-08-19 ENCOUNTER — Encounter: Payer: Self-pay | Admitting: Pharmacist

## 2017-08-19 LAB — CBC
HCT: 20.5 % — ABNORMAL LOW (ref 36.0–46.0)
HEMOGLOBIN: 6.9 g/dL — AB (ref 12.0–15.0)
MCH: 27.1 pg (ref 26.0–34.0)
MCHC: 33.7 g/dL (ref 30.0–36.0)
MCV: 80.4 fL (ref 78.0–100.0)
Platelets: 274 10*3/uL (ref 150–400)
RBC: 2.55 MIL/uL — AB (ref 3.87–5.11)
RDW: 16.9 % — ABNORMAL HIGH (ref 11.5–15.5)
WBC: 5.4 10*3/uL (ref 4.0–10.5)

## 2017-08-19 LAB — HEMOGLOBIN AND HEMATOCRIT, BLOOD
HEMATOCRIT: 25.1 % — AB (ref 36.0–46.0)
HEMOGLOBIN: 8.2 g/dL — AB (ref 12.0–15.0)

## 2017-08-19 LAB — COMPREHENSIVE METABOLIC PANEL
ALK PHOS: 130 U/L — AB (ref 38–126)
ALT: 9 U/L — AB (ref 14–54)
AST: 19 U/L (ref 15–41)
Albumin: 2 g/dL — ABNORMAL LOW (ref 3.5–5.0)
Anion gap: 9 (ref 5–15)
BUN: 18 mg/dL (ref 6–20)
CALCIUM: 8.3 mg/dL — AB (ref 8.9–10.3)
CO2: 24 mmol/L (ref 22–32)
CREATININE: 0.83 mg/dL (ref 0.44–1.00)
Chloride: 93 mmol/L — ABNORMAL LOW (ref 101–111)
GFR calc non Af Amer: >60 mL/min (ref >60)
Glucose, Bld: 125 mg/dL — ABNORMAL HIGH (ref 65–99)
Potassium: 5.4 mmol/L — ABNORMAL HIGH (ref 3.5–5.1)
SODIUM: 126 mmol/L — AB (ref 135–145)
Total Bilirubin: 0.5 mg/dL (ref 0.3–1.2)
Total Protein: 5.8 g/dL — ABNORMAL LOW (ref 6.5–8.1)

## 2017-08-19 LAB — ABO/RH: ABO/RH(D): B POS

## 2017-08-19 LAB — MAGNESIUM: MAGNESIUM: 2.4 mg/dL (ref 1.7–2.4)

## 2017-08-19 LAB — PREPARE RBC (CROSSMATCH)

## 2017-08-19 MED ORDER — FENTANYL 75 MCG/HR TD PT72
150.0000 ug | MEDICATED_PATCH | TRANSDERMAL | Status: DC
  Administered 2017-08-19 – 2017-08-22 (×2): 150 ug via TRANSDERMAL
  Filled 2017-08-19 (×2): qty 2

## 2017-08-19 MED ORDER — SODIUM CHLORIDE 0.9 % IV SOLN: Freq: Once | INTRAVENOUS | Status: DC

## 2017-08-19 MED ORDER — LACTULOSE 10 GM/15ML PO SOLN: 20.0000 g | Freq: Two times a day (BID) | ORAL | Status: DC | PRN

## 2017-08-19 MED ORDER — HYDROMORPHONE 1 MG/ML IV SOLN
INTRAVENOUS | Status: DC
  Administered 2017-08-20: 0.5 mg via INTRAVENOUS
  Administered 2017-08-20: 1 mg via INTRAVENOUS
  Administered 2017-08-20: 1.03 mg via INTRAVENOUS
  Administered 2017-08-20: 1.5 mg via INTRAVENOUS
  Administered 2017-08-20: 7.5 mg via INTRAVENOUS
  Administered 2017-08-20: 10:00:00 via INTRAVENOUS
  Administered 2017-08-20 – 2017-08-21 (×2): 2 mg via INTRAVENOUS
  Administered 2017-08-21 (×2): 0.5 mg via INTRAVENOUS
  Filled 2017-08-19: qty 25

## 2017-08-19 MED ORDER — HYDROMORPHONE 1 MG/ML IV SOLN
INTRAVENOUS | Status: DC
  Administered 2017-08-19: 1.58 mg via INTRAVENOUS

## 2017-08-19 NOTE — Progress Notes (Signed)
CRITICAL VALUE ALERT  Critical Value:  Hgb 6.9  Date & Time Notied: 08/19/17 0421  Provider Notified: Opyd  Orders Received/Actions taken: Transfuse 1 unit RBC

## 2017-08-19 NOTE — Progress Notes (Signed)
PROGRESS NOTE    Amber Silva  YBO:[id number] DOB: [birth year] DOA: 08/11/2017 PCP: Amber Butt., MD   Brief Narrative:  27 y.o.femalewith medical history significant ofstage IV adenocarcinoma of the rectum status post sigmoid colostomy, chemotherapy, palliative radiation therapy, cancer metastasized tothe liverand peritoneal metastatic disease, with massive malignant ascites, who gets all of her care at [facility] and is looking for Amber Silva second opinion here at [facility] health, presents to the emergency room with progressive abdominal distention and discomfort.  She has recently developed malignant ascites with her first paracentesis last week, and at that time she was also diagnosed with SBP and placed on ciprofloxacin.  She was found to have DVT recently and she did not Eliquis since.  There are concerns based on [facility] notes that she has pericardial metastasis.  Assessment & Plan:   Active Problems:   Malignant ascites   Abdominal distention   Hyponatremia   Malnutrition of moderate degree   Rectal cancer (HCC)  Metastatic rectal cancer -With extensive peritoneal metastatic disease, hepatic metastatic disease, concern for pericardial metastasis, with ascites.  Recently diagnosed with SBP on ciprofloxacin -Evaluated by Dr. Benay Silva, plan for FOLFIRI/Panitumumab starting 08/18/17 inpatient given her significant symptoms - Palliative care following, appreciate assistance with symptom control  Malignant ascites/recent history of spontaneous bacterial peritonitis -Status post large-volume paracentesis on 2/9, 5 L removed  - repeat paracentesis 2/11, almost another 5 L removed - 2/14 paracentesis 4.5 L removed - she's reaccumulated fluid rapidly.  She notes her distension seems stable today after her most recent paracentesis.  Will continue to monitor, use therapeutic paracentesis as needed and consider other options if this seems to be Amber Silva frequent occurrence.  - she's  been on cipro since 2/8 here at the hospital and looking at most recent d/c summary it looks like it was written for her to start on 2/2.  I have not seen any culture data, though per her d/c summary, looks like she did have greater than 250 neutrophils.  Abx d/c'd.   -Significant pain, palliative care following, appreciate recs (dilaudid PCA, phenergan)  Protein calorie malnutrition -Dietary consult  Recent DVT -Extensive DVT noted even on the CT scan of the left femoral and common femoral veins. -Continue Eliquis   Hyponatremia - Worsened today.  Clinically with massive ascites and volume overload, but likely intravascular depletion with hypoalbuminemia.  Will continue to follow.  Received albumin 2/14 after paracentesis and receiving blood today on 2/15.  Serum osm low, urine Na, urine osm pending.    Sinus tachycardia -TSH slightly elevated.  Her tachycardia was worked up at Amber Silva without any clear cause but thought to be due to progressive cancer -Free T4 1.25, unclear significance of TFT's in setting of her acute illness, consider repeating this as outpatient - continue to monitor  Anemia:  Plan for 1 unit pRBC today.  No evidence of bleeding.  Her H/H has fluctuated since she's been here from 9-7.1, today 6.9.      Hyperkalemia: mild to 5.6, follow.   Chronic pain -Palliative following  Goals of care -She is Amber Silva full code  DVT prophylaxis: eliquis Code Status: full  Family Communication: mother at bedside Disposition Plan: pending    Consultants:   Palliative Care  Oncology  Procedures:   US guided paracentesis 2/9  US guided paracentesis 2/11  US guided paracentesis 2/14  Antimicrobials: Anti-infectives (From admission, onward)   Start     Dose/Rate Route Frequency Ordered Stop  08/18/17 1100  doxycycline (VIBRA-TABS) tablet 100 mg     100 mg Oral Every 12 hours 08/18/17 1009     08/12/17 2000  ciprofloxacin (CIPRO) tablet 500 mg     500 mg  Oral 2 times daily 08/12/17 1800 08/17/17 2012       Subjective: Feels better after paracentesis No complaints today. Going to get up to chair, out of bed.   Objective: Vitals:   08/19/17 0045 08/19/17 0421 08/19/17 0435 08/19/17 0800  BP:  107/84    Pulse:  91    Resp: 11 (!) 8 (!) 9 (!) 24  Temp:  98.1 F (36.7 C)    TempSrc:  Oral    SpO2: 99% 91% 99% 100%  Weight:      Height:        Intake/Output Summary (Last 24 hours) at 08/19/2017 0917 Last data filed at 08/18/2017 1445 Gross per 24 hour  Intake 120 ml  Output 200 ml  Net -80 ml   Filed Weights   08/12/17 1700 08/14/17 0553 08/18/17 0725  Weight: 67.7 kg (149 lb 4 oz) 66.2 kg (145 lb 15.1 oz) 61.6 kg (135 lb 12.8 oz)    Examination:  General: No acute distress. Cardiovascular: Heart sounds show Amber Silva regular rate, and rhythm. No gallops or rubs. No murmurs. No JVD. Lungs: Clear to auscultation bilaterally with good air movement. No rales, rhonchi or wheezes. Abdomen: Soft, nontender, distended, mildly ttp.  Ostomy with pink stoma, liquid stool.  Neurological: Alert and oriented 3. Moves all extremities 4 with equal strength. Cranial nerves II through XII grossly intact. Skin: Warm and dry. No rashes or lesions. Extremities: L>R LEE Psychiatric: Mood and affect are normal. Insight and judgment are appropriate.     Data Reviewed: I have personally reviewed following labs and imaging studies  CBC: Recent Labs  Lab 08/15/17 0357 08/16/17 0423 08/17/17 0500 08/18/17 0500 08/19/17 0334  WBC 10.5 9.7 9.2 9.6 5.4  HGB 7.9* 7.7* 7.9* 8.0* 6.9*  HCT 24.6* 23.9* 24.0* 24.7* 20.5*  MCV 82.6 82.4 81.1 81.3 80.4  PLT 291 274 279 303 657   Basic Metabolic Panel: Recent Labs  Lab 08/13/17 0350 08/14/17 0409 08/18/17 0500 08/19/17 0334  NA 128* 128* 126* 126*  K 4.9 4.3 5.1 5.4*  CL 97* 97* 93* 93*  CO2 23 25 22 24   GLUCOSE 106* 99 119* 125*  BUN 14 10 12 18   CREATININE 0.87 0.64 0.76 0.83  CALCIUM  8.9 8.2* 8.2* 8.3*  MG 2.1 1.9 1.8  --   PHOS 3.9 3.0  --   --    GFR: Estimated Creatinine Clearance: 84.2 mL/min (by C-G formula based on SCr of 0.83 mg/dL). Liver Function Tests: Recent Labs  Lab 08/13/17 0350 08/14/17 0409 08/18/17 0500 08/19/17 0334  AST 19 22 25 19   ALT 9* 8* 9* 9*  ALKPHOS 139* 108 154* 130*  BILITOT 0.4 0.2* 0.2* 0.5  PROT 6.7 5.6* 5.8* 5.8*  ALBUMIN 1.9* 1.9* 1.5* 2.0*   No results for input(s): LIPASE, AMYLASE in the last 168 hours. No results for input(s): AMMONIA in the last 168 hours. Coagulation Profile: No results for input(s): INR, PROTIME in the last 168 hours. Cardiac Enzymes: No results for input(s): CKTOTAL, CKMB, CKMBINDEX, TROPONINI in the last 168 hours. BNP (last 3 results) No results for input(s): PROBNP in the last 8760 hours. HbA1C: No results for input(s): HGBA1C in the last 72 hours. CBG: No results for input(s): GLUCAP in the  last 168 hours. Lipid Profile: No results for input(s): CHOL, HDL, LDLCALC, TRIG, CHOLHDL, LDLDIRECT in the last 72 hours. Thyroid Function Tests: No results for input(s): TSH, T4TOTAL, FREET4, T3FREE, THYROIDAB in the last 72 hours. Anemia Panel: No results for input(s): VITAMINB12, FOLATE, FERRITIN, TIBC, IRON, RETICCTPCT in the last 72 hours. Sepsis Labs: No results for input(s): PROCALCITON, LATICACIDVEN in the last 168 hours.  Recent Results (from the past 240 hour(s))  Blood Culture (routine x 2)     Status: None   Collection Time: 08/11/17  8:45 AM  Result Value Ref Range Status   Specimen Description   Final    BLOOD RIGHT ANTECUBITAL Performed at [facility], [address] [place], [place] [address]    Special Requests   Final    BOTTLES DRAWN AEROBIC AND ANAEROBIC Blood Culture adequate volume Performed at [facility], [address] [place], [place] [address]    Culture   Final    NO GROWTH 5 DAYS Performed at [facility], [place] [address]  [place], [place] [address]    Report Status 08/16/2017 FINAL  Final  Blood Culture (routine x 2)     Status: None   Collection Time: 08/11/17  8:55 AM  Result Value Ref Range Status   Specimen Description   Final    BLOOD LEFT ANTECUBITAL Performed at [facility], [address] [place], [place] [address]    Special Requests   Final    BOTTLES DRAWN AEROBIC AND ANAEROBIC Blood Culture adequate volume Performed at [facility] [address] [place], [place] [address]    Culture   Final    NO GROWTH 5 DAYS Performed at [facility], [place] [address] [place], [place] [address]    Report Status 08/16/2017 FINAL  Final  Culture, body fluid-bottle     Status: None   Collection Time: 08/11/17 10:35 AM  Result Value Ref Range Status   Specimen Description ASCITIC  Final   Special Requests NONE  Final   Culture   Final    NO GROWTH 5 DAYS Performed at [place] [address], [place], [place] [address]    Report Status 08/16/2017 FINAL  Final  Gram stain     Status: None   Collection Time: 08/11/17 10:35 AM  Result Value Ref Range Status   Specimen Description ASCITIC  Final   Special Requests NONE  Final   Gram Stain   Final    NO WBC SEEN NO ORGANISMS SEEN Performed at [facility], [place] [address], [place], [place] [address]    Report Status 08/11/2017 FINAL  Final  Culture, body fluid-bottle     Status: None   Collection Time: 08/13/17 10:15 AM  Result Value Ref Range Status   Specimen Description PERITONEAL  Final   Special Requests NONE  Final   Culture   Final    NO GROWTH 5 DAYS Performed at [place] [address], [place], [place] [address]    Report Status 08/18/2017 FINAL  Final  Gram stain     Status: None   Collection Time: 08/13/17 10:15 AM  Result Value Ref Range Status   Specimen Description PERITONEAL  Final   Special Requests NONE  Final   Gram Stain   Final    FEW WBC PRESENT,BOTH PMN AND  MONONUCLEAR NO ORGANISMS SEEN Performed at [facility], [place] [address], [place], [place] [address]    Report Status 08/13/2017 FINAL  Final  Gram stain     Status: None   Collection Time: 08/18/17 11:46 AM  Result Value Ref Range Status   Specimen Description PERITONEAL  Final   Special Requests NONE  Final   Gram Stain   Final    MODERATE WBC PRESENT,BOTH PMN AND MONONUCLEAR NO ORGANISMS SEEN Performed at [facility], [place] [address], [place], [place] [address]    Report Status 08/18/2017 FINAL  Final         Radiology Studies: US Paracentesis  Result Date: 08/18/2017 INDICATION: Patient with history of metastatic rectal cancer, recurrent ascites. Request made for diagnostic and therapeutic paracentesis. EXAM: ULTRASOUND GUIDED DIAGNOSTIC AND THERAPEUTIC PARACENTESIS MEDICATIONS: None. COMPLICATIONS: None immediate. PROCEDURE: Informed written consent was obtained from the patient after Lavon Bothwell discussion of the risks, benefits and alternatives to treatment. Chosen Garron timeout was performed prior to the initiation of the procedure. Initial ultrasound scanning demonstrates Elbert Spickler large amount of ascites within the right lower abdominal quadrant. The right lower abdomen was prepped and draped in the usual sterile fashion. 1% lidocaine was used for local anesthesia. Following this, Donielle Kaigler Yueh catheter was introduced. An ultrasound image was saved for documentation purposes. The paracentesis was performed. The catheter was removed and Janelli Welling dressing was applied. The patient tolerated the procedure well without immediate post procedural complication. FINDINGS: Alekxander Isola total of approximately 4.4 liters of bloody fluid was removed. Samples were sent to the laboratory as requested by the clinical team. IMPRESSION: Successful ultrasound-guided diagnostic and therapeutic paracentesis yielding 4.4 liters of peritoneal fluid. Read by: Rowe Robert, PA-C Electronically Signed   By: Sandi Mariscal M.D.   On: 08/18/2017 12:18         Scheduled Meds: . apixaban  5 mg Oral BID  . doxycycline  100 mg Oral Q12H  . feeding supplement (ENSURE ENLIVE)  237 mL Oral BID BM  . fentaNYL  75 mcg Transdermal Q72H  . FLUOROURACIL (ADRUCIL) CHEMO infusion For Inpatient Use  1,800 mg/m2 (Treatment Plan Recorded) Intravenous Once  . HYDROmorphone   Intravenous Q4H  . polyethylene glycol  17 g Oral BID  . senna-docusate  2 tablet Oral BID   Continuous Infusions: . sodium chloride    . sodium chloride    . sodium chloride    . famotidine       LOS: 7 days    Time spent: over 30 min    Fayrene Helper, MD [facility] Pager [phone]  If 7PM-7AM, please contact night-coverage [email or web address] Password [facility] 08/19/2017, 9:17 AM

## 2017-08-19 NOTE — Progress Notes (Signed)
Daily Progress Note   Patient Name: Amber Silva       Date: 08/19/2017 DOB: 1990-11-01  Age: 27 y.o. MRN#: [id number] Attending Physician: Elodia Florence., * Primary Care Physician: [name]., MD Admit Date: 08/11/2017  Reason for Consultation/Follow-up: Establishing goals of care and Pain control  Subjective: Reviewed chart and discussed with her bedside RN.    On examination, she was awake but sleepy.  Reports pain currently well controlled (received 20mg  dilaudid in the last 24 hours via PCA).  Length of Stay: 7  Current Medications: Scheduled Meds:  . apixaban  5 mg Oral BID  . doxycycline  100 mg Oral Q12H  . feeding supplement (ENSURE ENLIVE)  237 mL Oral BID BM  . fentaNYL  150 mcg Transdermal Q72H  . FLUOROURACIL (ADRUCIL) CHEMO infusion For Inpatient Use  1,800 mg/m2 (Treatment Plan Recorded) Intravenous Once  . [START ON 08/20/2017] HYDROmorphone   Intravenous Q4H  . polyethylene glycol  17 g Oral BID  . senna-docusate  2 tablet Oral BID    Continuous Infusions: . sodium chloride    . sodium chloride    . sodium chloride    . famotidine      PRN Meds: sodium chloride, albuterol, alteplase, diphenhydrAMINE **OR** diphenhydrAMINE, diphenhydrAMINE, diphenhydrAMINE, EPINEPHrine, EPINEPHrine, EPINEPHrine, EPINEPHrine, famotidine, heparin lock flush, heparin lock flush, hydrocortisone cream, HYDROmorphone, lactulose, methylPREDNISolone sodium succinate, morphine, naloxone **AND** sodium chloride flush, ondansetron (ZOFRAN) IV, promethazine, sodium chloride flush, sodium chloride flush, sodium chloride flush  Physical Exam      Constitutional: She appears lethargic. She appears cachectic. She is cooperative. She appears ill.  Pulmonary/Chest: Effort normal.    Abdominal: She exhibits distension.  Mildly distended s/p Paracentesis   Musculoskeletal:       Right shoulder: She exhibits decreased strength.  Neurological: She appears lethargic.   Vital Signs: BP 106/90 (BP Location: Right Arm)   Pulse (!) 118   Temp 98.6 F (37 C) (Oral)   Resp (!) 24   Ht 5\' 3"  (1.6 m)   Wt 61.6 kg (135 lb 12.8 oz)   SpO2 100%   BMI 24.06 kg/m  SpO2: SpO2: 100 % O2 Device: O2 Device: Not Delivered O2 Flow Rate: O2 Flow Rate (L/min): 0 L/min  Intake/output summary:   Intake/Output Summary (Last 24 hours) at 08/19/2017  [place] filed at 08/19/2017 1853 Gross per 24 hour  Intake 60 ml  Output 1 ml  Net 59 ml   LBM: Last BM Date: 08/19/17 Baseline Weight: Weight: 63.5 kg (140 lb) Most recent weight: Weight: 61.6 kg (135 lb 12.8 oz)       Palliative Assessment/Data:      Patient Active Problem List   Diagnosis Date Noted  . Rectal cancer ([place]) 08/18/2017  . Malnutrition of moderate degree 08/15/2017  . Malignant ascites 08/12/2017  . Abdominal distention 08/12/2017  . Hyponatremia 08/12/2017    Palliative Care Assessment & Plan   Patient Profile: 27 y.o. female  admitted on 08/11/2017 with abdominal pain and swelling. She has a PMH of metastatic rectal cancer, ascites, s/p colostomy.  She has been receiving Oncology treatments and care at [facility] She was initially diagnosed with rectal cancer July 2018. She underwent a loop ileostomy on 02/16/2017 and completed 4 cycles of FOLFIRNOX with last treatment 04/27/2017. She completed a partial course of radiation and Xeloda to pelvic area. Recent CT abdomen/pelvis showed a large rectal mass that appeared necrotic and with discrete tract extending toward the vagina/perineum; interval development of massive ascites; diffuse caking of the omentum; descending colostomy bulging due to peritoneal metastases; hepatic metastasesmeasuring up to 4.7 cm in the upper right liver;  large thrombus in the left femoral and common femoral veins.   Assessment: Patient Active Problem List   Diagnosis Date Noted  . Rectal cancer ([place]) 08/18/2017  . Malnutrition of moderate degree 08/15/2017  . Malignant ascites 08/12/2017  . Abdominal distention 08/12/2017  . Hyponatremia 08/12/2017   Recommendations/Plan:  Full code  Plan for inpatient immunotherapy.  Discussed this again with patient today.  Plan to increase fentanyl patch to 117mcg/hr.  Will work to decrease basal rate (1/2 basal rate 6 hours after increasing patch and stop basal rate 12 hours after increasing patch).  Continue bolus 0.5mg  with 15 minute lockout.    Phenergan PRN for nausea/vomiting   Palliative team will continue to support patient, family, and providers during hospitalization  Would consider Palliative to follow up post-discharge at [facility] for symptom management and support   Goals of Care and Additional Recommendations:  Limitations on Scope of Treatment: Full Scope Treatment  Code Status:    Code Status Orders  (From admission, onward)        Start     Ordered   08/12/17 1753  Full code  Continuous     08/12/17 1753    Code Status History    Date Active Date Inactive Code Status Order ID Comments User Context   This patient has a current code status but no historical code status.       Prognosis:   Guarded  Discharge Planning:  To Be Determined  Care plan was discussed with patient, RN  Thank you for allowing the Palliative Medicine Team to assist in the care of this patient.   Total Time 25 Prolonged Time Billed No      Greater than 50%  of this time was spent counseling and coordinating care related to the above assessment and plan.  Micheline Rough, MD  Please contact Palliative Medicine Team phone at [phone] for questions and concerns.

## 2017-08-19 NOTE — Progress Notes (Addendum)
IP PROGRESS NOTE  Subjective:   Ms. Diop developed acute diarrhea and nausea during the irinotecan infusion.  She received atropine with improvement.  No nausea this morning.  She reports adequate pain control.  She underwent a paracentesis for 4 L yesterday.  Objective: Vital signs in last 24 hours: Blood pressure 107/84, pulse 91, temperature 98.1 F (36.7 C), temperature source Oral, resp. rate (!) 9, height 5' 3"  (1.6 m), weight 135 lb 12.8 oz (61.6 kg), SpO2 99 %.  Intake/Output from previous day: 02/14 0701 - 02/15 0700 In: 120 [P.O.:120] Out: 200 [Stool:200]  Physical Exam: Lungs: Clear anteriorly, no respiratory distress Cardiac: Regular rate and rhythm Abdomen: Less distended, masslike fullness in the right lower abdomen, left lower quadrant colostomy  Extremities: Trace edema throughout the left leg  Portacath/PICC-without erythema  Lab Results: Recent Labs    08/18/17 0500 08/19/17 0334  WBC 9.6 5.4  HGB 8.0* 6.9*  HCT 24.7* 20.5*  PLT 303 274    BMET Recent Labs    08/18/17 0500 08/19/17 0334  NA 126* 126*  K 5.1 5.4*  CL 93* 93*  CO2 22 24  GLUCOSE 119* 125*  BUN 12 18  CREATININE 0.76 0.83  CALCIUM 8.2* 8.3*    Medications: I have reviewed the patient's current medications.  Assessment/Plan: 1. Rectal cancer July 2018   Colonoscopy 01/25/2017-biopsy showed invasive moderately differentiated adenocarcinoma with mucinous features.    CT chest/abdomen/pelvis 02/11/2017-very large ulcerated rectal mass with severe wall thickening over the distal 11.2 cm of the rectum extending to the anus with surrounding perirectal adenopathy and pelvic sidewall adenopathy.  At least 2 metastatic lesions in the right hepatic lobe.   MRI of the pelvis 02/14/2017-T4b, N2 bulky rectal mass with deep infiltrative invasion of the bilateral pelvic sidewalls, presacral space, mesorectal fascia, distal branches of the internal iliac vasculature and sacral plexus.  There was  abnormal signal intensity along both sciatic nerves possibly representing perineural extension.  The mass was noted to approximate the posterior vaginal wall without definite invasion.    PET scan 02/15/2017-colorectal malignancy with mesorectal fascia infiltration and 2 FDG avid hepatic metastases.  Regional lymphadenopathy found on prior imaging was not FDG avid on the PET scan.    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   Foundation 1-KRAS/NRAS wild-type; microsatellite stable; tumor mutational burden 3; BRCA1 rearrangement intron 2  4 cycles of FOLFIRINOX 03/09/2017 through 04/27/2017.    CT abdomen/pelvis 05/06/2017-large rectal mass and known hepatic metastases similar to recent comparison.    Status post partial course of radiation/Xeloda.   CTs 07/12/2017-large necrotic invasive rectal mass; increased size of known liver metastatic lesions; interval increase in size of peritoneal implants; new development of moderate volume abdominal ascites; gas again present within the anterior rectal wall closely opposing the gas-filled vagina.  Rectovaginal fistula not excluded.    Admitted to [facility] 08/12/2017 with abdominal pain and swelling  CT abdomen/pelvis 08/11/2017-large rectal mass that appeared necrotic and with discrete tract extending toward the vagina/perineum; interval development of massive ascites; diffuse caking of the omentum; descending colostomy bulging due to peritoneal metastases; hepatic metastases measuring up to 4.7 cm in the upper right liver; large thrombus in the left femoral and common femoral veins.  Cycle 1 FOLFIRI/panitumumab 08/18/2017 2. Left lower extremity DVT.  On Eliquis. 3. Ascites secondary to #1. 4. Tachycardia-likely secondary to anemia, intravascular depletion, critical illness-improved 08/19/2017   Ms. Midgett started cycle 1 salvage therapy with FOLFIRI/panitumumab 08/18/2017.  She is completing  the 5-fluorouracil infusion.  She tolerated the  chemotherapy well, though she developed diarrhea and nausea during the irinotecan infusion.  I will change the lactulose to as needed given the diarrhea yesterday.  Palliative care medicine is managing the pain regimen.  Hopefully she can be transitioned to an outpatient regimen within the next few days.  She will be scheduled for outpatient follow-up and cycle 2 FOLFIRI/Panitumumab at the Cancer center for 09/01/2017.     Recommendations: 1.  Continue pain management by palliative care medicine, transition to outpatient regimen as possible 2.  Complete infusional 5-fluorouracil as scheduled 3.  Increase ambulation as tolerated 4.  Oncology will check on her 08/20/2017    LOS: 7 days   Betsy Coder, MD   08/19/2017, 8:00 AM

## 2017-08-20 DIAGNOSIS — G893 Neoplasm related pain (acute) (chronic): Secondary | ICD-10-CM

## 2017-08-20 LAB — TYPE AND SCREEN
ABO/RH(D): B POS
ANTIBODY SCREEN: NEGATIVE
UNIT DIVISION: 0

## 2017-08-20 LAB — COMPREHENSIVE METABOLIC PANEL
ALBUMIN: 1.8 g/dL — AB (ref 3.5–5.0)
ALK PHOS: 124 U/L (ref 38–126)
ALT: 18 U/L (ref 14–54)
AST: 59 U/L — ABNORMAL HIGH (ref 15–41)
Anion gap: 7 (ref 5–15)
BILIRUBIN TOTAL: 0.7 mg/dL (ref 0.3–1.2)
BUN: 16 mg/dL (ref 6–20)
CALCIUM: 8.3 mg/dL — AB (ref 8.9–10.3)
CO2: 25 mmol/L (ref 22–32)
Chloride: 96 mmol/L — ABNORMAL LOW (ref 101–111)
Creatinine, Ser: 0.59 mg/dL (ref 0.44–1.00)
GLUCOSE: 97 mg/dL (ref 65–99)
Potassium: 4.6 mmol/L (ref 3.5–5.1)
Sodium: 128 mmol/L — ABNORMAL LOW (ref 135–145)
TOTAL PROTEIN: 5.5 g/dL — AB (ref 6.5–8.1)

## 2017-08-20 LAB — BPAM RBC
Blood Product Expiration Date: 201903062359
ISSUE DATE / TIME: 201902151401
Unit Type and Rh: 7300

## 2017-08-20 LAB — OSMOLALITY, URINE: Osmolality, Ur: 617 mOsm/kg (ref 300–900)

## 2017-08-20 LAB — IRON AND TIBC
Iron: 92 ug/dL (ref 28–170)
Saturation Ratios: 81 % — ABNORMAL HIGH (ref 10.4–31.8)
TIBC: 113 ug/dL — ABNORMAL LOW (ref 250–450)
UIBC: 21 ug/dL

## 2017-08-20 LAB — VITAMIN B12: VITAMIN B 12: 1185 pg/mL — AB (ref 180–914)

## 2017-08-20 LAB — FOLATE: Folate: >40 ng/mL (ref >5.9)

## 2017-08-20 LAB — CBC
HEMATOCRIT: 24.7 % — AB (ref 36.0–46.0)
HEMOGLOBIN: 8.1 g/dL — AB (ref 12.0–15.0)
MCH: 26.6 pg (ref 26.0–34.0)
MCHC: 32.8 g/dL (ref 30.0–36.0)
MCV: 81 fL (ref 78.0–100.0)
Platelets: 259 10*3/uL (ref 150–400)
RBC: 3.05 MIL/uL — ABNORMAL LOW (ref 3.87–5.11)
RDW: 16.7 % — ABNORMAL HIGH (ref 11.5–15.5)
WBC: 6.4 10*3/uL (ref 4.0–10.5)

## 2017-08-20 LAB — SODIUM, URINE, RANDOM: Sodium, Ur: <10 mmol/L

## 2017-08-20 LAB — MAGNESIUM: Magnesium: 2 mg/dL (ref 1.7–2.4)

## 2017-08-20 LAB — FERRITIN: Ferritin: 1036 ng/mL — ABNORMAL HIGH (ref 11–307)

## 2017-08-20 NOTE — Progress Notes (Addendum)
PROGRESS NOTE    Amber Silva  ZOX:[id number] DOB: [birth year] DOA: 08/11/2017 PCP: [name]., MD   Brief Narrative:  27 y.o.femalewith medical history significant ofstage IV adenocarcinoma of the rectum status post sigmoid colostomy, chemotherapy, palliative radiation therapy, cancer metastasized tothe liverand peritoneal metastatic disease, with massive malignant ascites, who gets all of her care at [facility] and is looking for Amber Silva second opinion here at [facility] health, presents to the emergency room with progressive abdominal distention and discomfort.  She has recently developed malignant ascites with her first paracentesis last week, and at that time she was also diagnosed with SBP and placed on ciprofloxacin.  She was found to have DVT recently and she did not Eliquis since.  There are concerns based on [facility] notes that she has pericardial metastasis.  Assessment & Plan:   Active Problems:   Malignant ascites   Abdominal distention   Hyponatremia   Malnutrition of moderate degree   Rectal cancer (HCC)  Metastatic rectal cancer -With extensive peritoneal metastatic disease, hepatic metastatic disease, concern for pericardial metastasis, with ascites.  Recently diagnosed with SBP on ciprofloxacin -Evaluated by Dr. Benay Spice, plan for FOLFIRI/Panitumumab starting 08/18/17 inpatient given her significant symptoms - Palliative care following, appreciate assistance with symptom control  Malignant ascites/recent history of spontaneous bacterial peritonitis -Status post large-volume paracentesis on 2/9, 5 L removed  - repeat paracentesis 2/11, almost another 5 L removed - 2/14 paracentesis 4.5 L removed - she's reaccumulated fluid rapidly. Distension seems to be stable today.  Will continue to monitor, use therapeutic paracentesis as needed and consider other options if this seems to be Amber Silva frequent occurrence.  - she's been on cipro since 2/8 here at the hospital  and looking at most recent d/c summary it looks like it was written for her to start on 2/2.  I have not seen any culture data, though per her d/c summary, looks like she did have greater than 250 neutrophils.  Abx d/c'd.   -Significant pain, palliative care following, appreciate recs (dilaudid PCA, fentanyl patch being increased, PCA being decreased, phenergan)  Protein calorie malnutrition -Dietary consult  Recent DVT  Left Lower extremity edema -Extensive DVT noted even on the CT scan of the left femoral and common femoral veins. -Continue Eliquis   Hyponatremia - Relatively stable.  Clinically with massive ascites and volume overload, but likely intravascular depletion with hypoalbuminemia.  Will continue to follow.  Received albumin 2/14 after paracentesis and received blood on 2/15.  Serum osm low, urine Na low, urine osm high which supports that she is likely intravascularly depleted.    Sinus tachycardia - slightly better today, continue to monitor, consider beta blocker -TSH slightly elevated.  Her tachycardia was worked up at [facility] without any clear cause but thought to be due to progressive cancer -Free T4 1.25, unclear significance of TFT's in setting of her acute illness, consider repeating this as outpatient - continue to monitor  Anemia:  S/p 1 unit pRBC on 2/15.  No evidence of bleeding.  Her H/H has fluctuated since she's been here from 9-7.1.  Today  Stable at 8.1.  Labs suggestive of AOCD.     Hyperkalemia: improved.  Chronic pain -Palliative following  Elevated AST: mild, follow   Goals of care -She is Amber Silva full code  DVT prophylaxis: eliquis Code Status: full  Family Communication: none at bedside Disposition Plan: pending    Consultants:   Palliative Care  Oncology  Procedures:   US  guided paracentesis 2/9  US guided paracentesis 2/11  US guided paracentesis 2/14  Antimicrobials: Anti-infectives (From admission, onward)   Start      Dose/Rate Route Frequency Ordered Stop   08/18/17 1100  doxycycline (VIBRA-TABS) tablet 100 mg     100 mg Oral Every 12 hours 08/18/17 1009     08/12/17 2000  ciprofloxacin (CIPRO) tablet 500 mg     500 mg Oral 2 times daily 08/12/17 1800 08/17/17 2012       Subjective: No complaints. Doing ok.  Feels she needs to pass gas. Distension feels stable.   Objective: Vitals:   08/20/17 0445 08/20/17 0958 08/20/17 1012 08/20/17 1200  BP:      Pulse:      Resp: 18 19 19 19   Temp:      TempSrc:      SpO2: 100% 100% 100% 100%  Weight:      Height:        Intake/Output Summary (Last 24 hours) at 08/20/2017 1329 Last data filed at 08/20/2017 1009 Gross per 24 hour  Intake 736.91 ml  Output 401 ml  Net 335.91 ml   Filed Weights   08/12/17 1700 08/14/17 0553 08/18/17 0725  Weight: 67.7 kg (149 lb 4 oz) 66.2 kg (145 lb 15.1 oz) 61.6 kg (135 lb 12.8 oz)    Examination:  General: No acute distress. Cardiovascular: Heart sounds show Amber Silva regular rate, and rhythm. No gallops or rubs. No murmurs. No JVD. Lungs: Clear to auscultation bilaterally with good air movement. No rales, rhonchi or wheezes. Abdomen: Soft, nontender, distended, mildly tender  Neurological: Alert and oriented 3. Moves all extremities 4. Cranial nerves II through XII grossly intact. Skin: Warm and dry. No rashes or lesions. Extremities: No clubbing or cyanosis. L>R LEE Psychiatric: Mood and affect are normal. Insight and judgment are appropriate.    Data Reviewed: I have personally reviewed following labs and imaging studies  CBC: Recent Labs  Lab 08/16/17 0423 08/17/17 0500 08/18/17 0500 08/19/17 0334 08/19/17 1825 08/20/17 0355  WBC 9.7 9.2 9.6 5.4  --  6.4  HGB 7.7* 7.9* 8.0* 6.9* 8.2* 8.1*  HCT 23.9* 24.0* 24.7* 20.5* 25.1* 24.7*  MCV 82.4 81.1 81.3 80.4  --  81.0  PLT 274 279 303 274  --  373   Basic Metabolic Panel: Recent Labs  Lab 08/14/17 0409 08/18/17 0500 08/19/17 0334  08/20/17 0355  NA 128* 126* 126* 128*  K 4.3 5.1 5.4* 4.6  CL 97* 93* 93* 96*  CO2 25 22 24 25   GLUCOSE 99 119* 125* 97  BUN 10 12 18 16   CREATININE 0.64 0.76 0.83 0.59  CALCIUM 8.2* 8.2* 8.3* 8.3*  MG 1.9 1.8 2.4 2.0  PHOS 3.0  --   --   --    GFR: Estimated Creatinine Clearance: 87.4 mL/min (by C-G formula based on SCr of 0.59 mg/dL). Liver Function Tests: Recent Labs  Lab 08/14/17 0409 08/18/17 0500 08/19/17 0334 08/20/17 0355  AST 22 25 19  59*  ALT 8* 9* 9* 18  ALKPHOS 108 154* 130* 124  BILITOT 0.2* 0.2* 0.5 0.7  PROT 5.6* 5.8* 5.8* 5.5*  ALBUMIN 1.9* 1.5* 2.0* 1.8*   No results for input(s): LIPASE, AMYLASE in the last 168 hours. No results for input(s): AMMONIA in the last 168 hours. Coagulation Profile: No results for input(s): INR, PROTIME in the last 168 hours. Cardiac Enzymes: No results for input(s): CKTOTAL, CKMB, CKMBINDEX, TROPONINI in the last 168 hours. BNP (last  3 results) No results for input(s): PROBNP in the last 8760 hours. HbA1C: No results for input(s): HGBA1C in the last 72 hours. CBG: No results for input(s): GLUCAP in the last 168 hours. Lipid Profile: No results for input(s): CHOL, HDL, LDLCALC, TRIG, CHOLHDL, LDLDIRECT in the last 72 hours. Thyroid Function Tests: No results for input(s): TSH, T4TOTAL, FREET4, T3FREE, THYROIDAB in the last 72 hours. Anemia Panel: Recent Labs    08/20/17 0355  VITAMINB12 1,185*  FOLATE >40.0  FERRITIN 1,036*  TIBC 113*  IRON 92   Sepsis Labs: No results for input(s): PROCALCITON, LATICACIDVEN in the last 168 hours.  Recent Results (from the past 240 hour(s))  Blood Culture (routine x 2)     Status: None   Collection Time: 08/11/17  8:45 AM  Result Value Ref Range Status   Specimen Description   Final    BLOOD RIGHT ANTECUBITAL Performed at [facility], [address] [place], [place] [address]    Special Requests   Final    BOTTLES DRAWN AEROBIC AND ANAEROBIC Blood Culture  adequate volume Performed at [facility], [address] [place], [place] [address]    Culture   Final    NO GROWTH 5 DAYS Performed at [facility], [place] [address] [place], [place] [address]    Report Status 08/16/2017 FINAL  Final  Blood Culture (routine x 2)     Status: None   Collection Time: 08/11/17  8:55 AM  Result Value Ref Range Status   Specimen Description   Final    BLOOD LEFT ANTECUBITAL Performed at [facility], [address] [place], [place] [address]    Special Requests   Final    BOTTLES DRAWN AEROBIC AND ANAEROBIC Blood Culture adequate volume Performed at [facility], [address] [place], [place] [address]    Culture   Final    NO GROWTH 5 DAYS Performed at [facility], [place] [address], [place], [place] [address]    Report Status 08/16/2017 FINAL  Final  Culture, body fluid-bottle     Status: None   Collection Time: 08/11/17 10:35 AM  Result Value Ref Range Status   Specimen Description ASCITIC  Final   Special Requests NONE  Final   Culture   Final    NO GROWTH 5 DAYS Performed at [facility] [address], [place], [place] [address]    Report Status 08/16/2017 FINAL  Final  Gram stain     Status: None   Collection Time: 08/11/17 10:35 AM  Result Value Ref Range Status   Specimen Description ASCITIC  Final   Special Requests NONE  Final   Gram Stain   Final    NO WBC SEEN NO ORGANISMS SEEN Performed at [facility], [place] [address], [place], [place] [address]    Report Status 08/11/2017 FINAL  Final  Culture, body fluid-bottle     Status: None   Collection Time: 08/13/17 10:15 AM  Result Value Ref Range Status   Specimen Description PERITONEAL  Final   Special Requests NONE  Final   Culture   Final    NO GROWTH 5 DAYS Performed at [facility] [address], [place], [place] [address]    Report Status 08/18/2017 FINAL  Final  Gram stain     Status: None    Collection Time: 08/13/17 10:15 AM  Result Value  Ref Range Status   Specimen Description PERITONEAL  Final   Special Requests NONE  Final   Gram Stain   Final    FEW WBC PRESENT,BOTH PMN AND MONONUCLEAR NO ORGANISMS SEEN Performed at [facility], [address] [place], [place] [address]    Report Status 08/13/2017 FINAL  Final  Culture, body fluid-bottle     Status: None (Preliminary result)   Collection Time: 08/18/17 11:46 AM  Result Value Ref Range Status   Specimen Description PERITONEAL  Final   Special Requests NONE  Final   Culture   Final    NO GROWTH 1 DAY Performed at [facility], [place] [address] [place], [place] [address]    Report Status PENDING  Incomplete  Gram stain     Status: None   Collection Time: 08/18/17 11:46 AM  Result Value Ref Range Status   Specimen Description PERITONEAL  Final   Special Requests NONE  Final   Gram Stain   Final    MODERATE WBC PRESENT,BOTH PMN AND MONONUCLEAR NO ORGANISMS SEEN Performed at [facility], [address], [place], [place] [address]    Report Status 08/18/2017 FINAL  Final         Radiology Studies: No results found.      Scheduled Meds: . apixaban  5 mg Oral BID  . doxycycline  100 mg Oral Q12H  . feeding supplement (ENSURE ENLIVE)  237 mL Oral BID BM  . fentaNYL  150 mcg Transdermal Q72H  . FLUOROURACIL (ADRUCIL) CHEMO infusion For Inpatient Use  1,800 mg/m2 (Treatment Plan Recorded) Intravenous Once  . HYDROmorphone   Intravenous Q4H  . polyethylene glycol  17 g Oral BID  . senna-docusate  2 tablet Oral BID   Continuous Infusions: . sodium chloride    . sodium chloride    . sodium chloride    . famotidine       LOS: 8 days    Time spent: over 30 min    Fayrene Helper, MD [facility] Pager [phone]  If 7PM-7AM, please contact night-coverage [email or web address] Password [email or web address] 08/20/2017, 1:29 PM

## 2017-08-20 NOTE — Evaluation (Signed)
Physical Therapy Evaluation Patient Details Name: Amber Silva MRN: [id number] DOB: 05/04/1991 Today's Date: 08/20/2017   History of Present Illness  27 y.o. 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 Recent DVT- on Eliquis  Clinical Impression  Pt admitted with above diagnosis. Pt currently with functional limitations due to the deficits listed below (see PT Problem List).  Pt will benefit from skilled PT to increase their independence and safety with mobility to allow discharge to the venue listed below.  Pt ambulated 140' pushing IV pole with min/guard with HR up to 136.  She is weak and her bedroom is on the 2nd floor.  Recommend HHPT at time of d/c. Will follow acutely.     Follow Up Recommendations Home health PT;Supervision/Assistance - 24 hour    Equipment Recommendations  None recommended by PT    Recommendations for Other Services       Precautions / Restrictions Precautions Precautions: Other (comment) Precaution Comments: colostomy Restrictions Weight Bearing Restrictions: No      Mobility  Bed Mobility Overal bed mobility: Independent                Transfers Overall transfer level: Modified independent                  Ambulation/Gait Ambulation/Gait assistance: Min guard Ambulation Distance (Feet): 140 Feet Assistive device: (IV pole) Gait Pattern/deviations: Decreased step length - right;Decreased step length - left     General Gait Details: Pt with tentative gait.  HR elevated to 136 with gait.  Stairs            Wheelchair Mobility    Modified Rankin (Stroke Patients Only)       Balance Overall balance assessment: Needs assistance   Sitting balance-Leahy Scale: Good     Standing balance support: During  functional activity Standing balance-Leahy Scale: Fair                               Pertinent Vitals/Pain Pain Assessment: 0-10 Pain Score: 5  Pain Location: abdomen Pain Descriptors / Indicators: Pressure Pain Intervention(s): Monitored during session;Limited activity within patient's tolerance    Home Living Family/patient expects to be discharged to:: Private residence Living Arrangements: Parent Available Help at Discharge: Family;Available PRN/intermittently Type of Home: House Home Access: Level entry     Home Layout: Two level;Bed/bath upstairs;1/2 bath on main level Home Equipment: None      Prior Function Level of Independence: Independent               Hand Dominance        Extremity/Trunk Assessment                Communication   Communication: No difficulties  Cognition Arousal/Alertness: Awake/alert Behavior During Therapy: WFL for tasks assessed/performed Overall Cognitive Status: Within Functional Limits for tasks assessed                                        General Comments      Exercises     Assessment/Plan    PT Assessment Patient needs continued PT services  PT Problem List Decreased strength;Decreased activity tolerance;Decreased balance;Decreased mobility;Cardiopulmonary status limiting activity  PT Treatment Interventions DME instruction;Gait training;Stair training;Functional mobility training;Therapeutic activities;Therapeutic exercise;Balance training;Patient/family education    PT Goals (Current goals can be found in the Care Plan section)  Acute Rehab PT Goals Patient Stated Goal: get stronger PT Goal Formulation: With patient Time For Goal Achievement: 09/03/17 Potential to Achieve Goals: Good    Frequency Min 3X/week   Barriers to discharge        Co-evaluation               AM-PAC PT "6 Clicks" Daily Activity  Outcome Measure Difficulty turning over in bed  (including adjusting bedclothes, sheets and blankets)?: None Difficulty moving from lying on back to sitting on the side of the bed? : None Difficulty sitting down on and standing up from a chair with arms (e.g., wheelchair, bedside commode, etc,.)?: None Help needed moving to and from a bed to chair (including a wheelchair)?: None Help needed walking in hospital room?: A Little Help needed climbing 3-5 steps with a railing? : A Lot 6 Click Score: 21    End of Session   Activity Tolerance: Patient tolerated treatment well Patient left: in chair;with call bell/phone within reach;Other (comment)(MD in room) Nurse Communication: Mobility status PT Visit Diagnosis: Muscle weakness (generalized) (M62.81);Difficulty in walking, not elsewhere classified (R26.2)    Time: 7482-7078 PT Time Calculation (min) (ACUTE ONLY): 30 min   Charges:   PT Evaluation $PT Eval Moderate Complexity: 1 Mod PT Treatments $Gait Training: 8-22 mins   PT G Codes:        Amber Silva, [place] Pager [phone] 08/20/2017   Galen [place] 08/20/2017, 2:43 PM

## 2017-08-20 NOTE — Progress Notes (Signed)
HEMATOLOGY-ONCOLOGY PROGRESS NOTE  SUBJECTIVE:Metastatic rectal cancer on palliative chemo with cycle 1 salvage therapy with FOLFIRI/panitumumab 08/18/2017 No further diarrhea. No Nausea Pain 5/10 Left leg swelling  OBJECTIVE: REVIEW OF SYSTEMS:   Constitutional: Frail and weak Eyes: Denies blurriness of vision Ears, nose, mouth, throat, and face: Denies mucositis or sore throat Respiratory: O2 by [place] Cardiovascular: Denies palpitation, chest discomfort Gastrointestinal:Colostomy, distended Skin: Denies abnormal skin rashes Lymphatics: Denies new lymphadenopathy or easy bruising Neurological:Denies numbness, tingling or new weaknesses Behavioral/Psych: Mood is stable, no new changes  Extremities: Left lower extremity edema  All other systems were reviewed with the patient and are negative.  I have reviewed the past medical history, past surgical history, social history and family history with the patient and they are unchanged from previous note.   PHYSICAL EXAMINATION: ECOG PERFORMANCE STATUS: 3 - Symptomatic, >50% confined to bed  Vitals:   08/20/17 0437 08/20/17 0445  BP: 121/70   Pulse: (!) 113   Resp: 12 18  Temp: 98.3 F (36.8 C)   SpO2: 100% 100%   Filed Weights   08/12/17 1700 08/14/17 0553 08/18/17 0725  Weight: 149 lb 4 oz (67.7 kg) 145 lb 15.1 oz (66.2 kg) 135 lb 12.8 oz (61.6 kg)    GENERAL:alert, no distress and comfortable SKIN: skin color, texture, turgor are normal, no rashes or significant lesions EYES: normal, Conjunctiva are pink and non-injected, sclera clear NECK: supple, thyroid normal size, non-tender, without nodularity LUNGS: clear to auscultation and percussion with normal breathing effort HEART: Tachy ABDOMEN:Abd distended and colostomy Musculoskeletal:no cyanosis of digits and no clubbing  NEURO: alert & oriented x 3 with fluent speech, no focal motor/sensory deficits  LABORATORY DATA:  I have reviewed the data as listed CMP Latest Ref  Rng & Units 08/20/2017 08/19/2017 08/18/2017  Glucose 65 - 99 mg/dL 97 125(H) 119(H)  BUN 6 - 20 mg/dL 16 18 12   Creatinine 0.44 - 1.00 mg/dL 0.59 0.83 0.76  Sodium 135 - 145 mmol/L 128(L) 126(L) 126(L)  Potassium 3.5 - 5.1 mmol/L 4.6 5.4(H) 5.1  Chloride 101 - 111 mmol/L 96(L) 93(L) 93(L)  CO2 22 - 32 mmol/L 25 24 22   Calcium 8.9 - 10.3 mg/dL 8.3(L) 8.3(L) 8.2(L)  Total Protein 6.5 - 8.1 g/dL 5.5(L) 5.8(L) 5.8(L)  Total Bilirubin 0.3 - 1.2 mg/dL 0.7 0.5 0.2(L)  Alkaline Phos 38 - 126 U/L 124 130(H) 154(H)  AST 15 - 41 U/L 59(H) 19 25  ALT 14 - 54 U/L 18 9(L) 9(L)    Lab Results  Component Value Date   WBC 6.4 08/20/2017   HGB 8.1 (L) 08/20/2017   HCT 24.7 (L) 08/20/2017   MCV 81.0 08/20/2017   PLT 259 08/20/2017   NEUTROABS 8.8 (H) 08/11/2017    ASSESSMENT AND PLAN: 1. Metastatic rectal cancer: on FOLFIRI-Panitumumab.  CT abdomen/pelvis 08/11/2017-large rectal mass that appeared necrotic and with discrete tract extending toward the vagina/perineum; interval development of massive ascites; diffuse caking of the omentum; descending colostomy bulging due to peritoneal metastases; hepatic metastases measuring up to 4.7 cm in the upper right liver; large thrombus in the left femoral and common femoral veins. She completes chemo today 2. Peritoneal carcinomatosis 3. Abd pain: On Pain pump 4. Left lower extremity DVT. On Eliquis 5. Anemia: Hb 8.1; Tranfuse if Hb is less than 8  Dr.Sherill will follow on Monday

## 2017-08-20 NOTE — Progress Notes (Signed)
Daily Progress Note   Patient Name: Amber Silva       Date: 08/20/2017 DOB: [birth year]  Age: 27 y.o. MRN#: [id number] Attending Physician: Elodia Florence., * Primary Care Physician: [name]., MD Admit Date: 08/11/2017  Reason for Consultation/Follow-up: Establishing goals of care and Pain control  Subjective: Reviewed chart and discussed with her bedside RN.    On examination, she was awake and sitting in chair.  Reports pain currently well controlled (received 12mg  dilaudid in the last 24 hours via PCA).  Length of Stay: 8  Current Medications: Scheduled Meds:  . apixaban  5 mg Oral BID  . doxycycline  100 mg Oral Q12H  . feeding supplement (ENSURE ENLIVE)  237 mL Oral BID BM  . fentaNYL  150 mcg Transdermal Q72H  . HYDROmorphone   Intravenous Q4H  . polyethylene glycol  17 g Oral BID  . senna-docusate  2 tablet Oral BID    Continuous Infusions: . sodium chloride    . sodium chloride    . sodium chloride    . famotidine      PRN Meds: sodium chloride, albuterol, alteplase, diphenhydrAMINE **OR** diphenhydrAMINE, diphenhydrAMINE, diphenhydrAMINE, EPINEPHrine, EPINEPHrine, EPINEPHrine, EPINEPHrine, famotidine, heparin lock flush, heparin lock flush, hydrocortisone cream, HYDROmorphone, lactulose, methylPREDNISolone sodium succinate, morphine, naloxone **AND** sodium chloride flush, ondansetron (ZOFRAN) IV, promethazine, sodium chloride flush, sodium chloride flush, sodium chloride flush  Physical Exam      Constitutional: She appears lethargic. She appears cachectic. She is cooperative. She appears ill.  Pulmonary/Chest: Effort normal.  Abdominal: She exhibits distension.  Mildly distended s/p Paracentesis   Musculoskeletal:       Right shoulder: She exhibits  decreased strength.  Neurological: She appears lethargic.   Vital Signs: BP 112/64 (BP Location: Right Arm)   Pulse (!) 125   Temp 98 F (36.7 C) (Oral)   Resp (!) 21   Ht 5\' 3"  (1.6 m)   Wt 61.6 kg (135 lb 12.8 oz)   SpO2 100%   BMI 24.06 kg/m  SpO2: SpO2: 100 % O2 Device: O2 Device: Not Delivered O2 Flow Rate: O2 Flow Rate (L/min): 0 L/min  Intake/output summary:   Intake/Output Summary (Last 24 hours) at 08/20/2017 2220 Last data filed at 08/20/2017 2044 Gross per 24 hour  Intake 1233.41 ml  Output 700 ml  Net 533.41 ml   LBM: Last BM Date: 08/19/17(Colostomy) Baseline Weight: Weight: 63.5 kg (140 lb) Most recent weight: Weight: 61.6 kg (135 lb 12.8 oz)       Palliative Assessment/Data:      Patient Active Problem List   Diagnosis Date Noted  . Rectal cancer ([place]) 08/18/2017  . Malnutrition of moderate degree 08/15/2017  . Malignant ascites 08/12/2017  . Abdominal distention 08/12/2017  . Hyponatremia 08/12/2017    Palliative Care Assessment & Plan   Patient Profile: 28 y.o. female  admitted on 08/11/2017 with abdominal pain and swelling. She has a PMH of metastatic rectal cancer, ascites, s/p colostomy.  She has been receiving Oncology treatments and care at [facility]. She was initially diagnosed with rectal cancer July 2018. She underwent a loop ileostomy on 02/16/2017 and completed 4 cycles of FOLFIRNOX with last treatment 04/27/2017. She completed a partial course of radiation and Xeloda to pelvic area. Recent CT abdomen/pelvis showed a large rectal mass that appeared necrotic and with discrete tract extending toward the vagina/perineum; interval development of massive ascites; diffuse caking of the omentum; descending colostomy bulging due to peritoneal metastases; hepatic metastasesmeasuring up to 4.7 cm in the upper right liver; large thrombus in the left femoral and common femoral veins.   Assessment: Patient Active Problem List    Diagnosis Date Noted  . Rectal cancer ([place]) 08/18/2017  . Malnutrition of moderate degree 08/15/2017  . Malignant ascites 08/12/2017  . Abdominal distention 08/12/2017  . Hyponatremia 08/12/2017   Recommendations/Plan:  Full code  Increased fentanyl patch to 189mcg/hr.  Off basal rate.  Continue bolus 0.5mg  with 15 minute lockout.  Will work to convert to oral regimen soon.  Phenergan PRN for nausea/vomiting   Palliative team will continue to support patient, family, and providers during hospitalization  Would consider Palliative to follow up post-discharge at [facility] for symptom management and support   Goals of Care and Additional Recommendations:  Limitations on Scope of Treatment: Full Scope Treatment  Code Status:    Code Status Orders  (From admission, onward)        Start     Ordered   08/12/17 1753  Full code  Continuous     08/12/17 1753    Code Status History    Date Active Date Inactive Code Status Order ID Comments User Context   This patient has a current code status but no historical code status.       Prognosis:   Guarded  Discharge Planning:  To Be Determined  Care plan was discussed with patient, RN  Thank you for allowing the Palliative Medicine Team to assist in the care of this patient.   Total Time 25 Prolonged Time Billed No      Greater than 50%  of this time was spent counseling and coordinating care related to the above assessment and plan.  Micheline Rough, MD  Please contact Palliative Medicine Team phone at [phone] for questions and concerns.

## 2017-08-21 LAB — COMPREHENSIVE METABOLIC PANEL
ALK PHOS: 216 U/L — AB (ref 38–126)
ALT: 106 U/L — ABNORMAL HIGH (ref 14–54)
ANION GAP: 6 (ref 5–15)
AST: 208 U/L — ABNORMAL HIGH (ref 15–41)
Albumin: 1.7 g/dL — ABNORMAL LOW (ref 3.5–5.0)
BUN: 10 mg/dL (ref 6–20)
CALCIUM: 8.1 mg/dL — AB (ref 8.9–10.3)
CO2: 25 mmol/L (ref 22–32)
Chloride: 98 mmol/L — ABNORMAL LOW (ref 101–111)
Creatinine, Ser: 0.48 mg/dL (ref 0.44–1.00)
GFR calc Af Amer: >60 mL/min (ref >60)
GFR calc non Af Amer: >60 mL/min (ref >60)
Glucose, Bld: 123 mg/dL — ABNORMAL HIGH (ref 65–99)
Potassium: 4 mmol/L (ref 3.5–5.1)
SODIUM: 129 mmol/L — AB (ref 135–145)
Total Bilirubin: 0.6 mg/dL (ref 0.3–1.2)
Total Protein: 5.4 g/dL — ABNORMAL LOW (ref 6.5–8.1)

## 2017-08-21 LAB — CBC
HCT: 26.3 % — ABNORMAL LOW (ref 36.0–46.0)
HEMOGLOBIN: 8.3 g/dL — AB (ref 12.0–15.0)
MCH: 25.8 pg — AB (ref 26.0–34.0)
MCHC: 31.6 g/dL (ref 30.0–36.0)
MCV: 81.7 fL (ref 78.0–100.0)
PLATELETS: 212 10*3/uL (ref 150–400)
RBC: 3.22 MIL/uL — ABNORMAL LOW (ref 3.87–5.11)
RDW: 16.5 % — ABNORMAL HIGH (ref 11.5–15.5)
WBC: 5.4 10*3/uL (ref 4.0–10.5)

## 2017-08-21 LAB — MAGNESIUM: Magnesium: 1.9 mg/dL (ref 1.7–2.4)

## 2017-08-21 MED ORDER — MORPHINE SULFATE 15 MG PO TABS
15.0000 mg | ORAL_TABLET | ORAL | Status: DC | PRN
  Administered 2017-08-21 – 2017-08-23 (×6): 15 mg via ORAL
  Filled 2017-08-21 (×6): qty 1

## 2017-08-21 MED ORDER — METOPROLOL TARTRATE 25 MG PO TABS
12.5000 mg | ORAL_TABLET | Freq: Two times a day (BID) | ORAL | Status: DC
  Administered 2017-08-21 – 2017-08-22 (×4): 12.5 mg via ORAL
  Filled 2017-08-21 (×5): qty 1

## 2017-08-21 MED ORDER — HYDROMORPHONE HCL 1 MG/ML IJ SOLN
0.5000 mg | INTRAMUSCULAR | Status: DC | PRN
  Administered 2017-08-21 – 2017-08-23 (×5): 0.5 mg via INTRAVENOUS
  Filled 2017-08-21 (×6): qty 1

## 2017-08-21 NOTE — Progress Notes (Signed)
PROGRESS NOTE    Amber Silva  JYN:[id number] DOB: October 26, 1990 DOA: 08/11/2017 PCP: [name]., MD   Brief Narrative:  27 y.o.femalewith medical history significant ofstage IV adenocarcinoma of the rectum status post sigmoid colostomy, chemotherapy, palliative radiation therapy, cancer metastasized tothe liverand peritoneal metastatic disease, with massive malignant ascites, who gets all of her care at [facility] and is looking for Tayli Buch second opinion here at [facility] health, presents to the emergency room with progressive abdominal distention and discomfort.  She has recently developed malignant ascites with her first paracentesis last week, and at that time she was also diagnosed with SBP and placed on ciprofloxacin.  She was found to have DVT recently and she did not Eliquis since.  There are concerns based on [facility] notes that she has pericardial metastasis.  Assessment & Plan:   Active Problems:   Malignant ascites   Abdominal distention   Hyponatremia   Malnutrition of moderate degree   Rectal cancer (HCC)  Metastatic rectal cancer -With extensive peritoneal metastatic disease, hepatic metastatic disease, concern for pericardial metastasis, with ascites.  Recently diagnosed with SBP on ciprofloxacin -Evaluated by Dr. Benay Spice, plan for FOLFIRI/Panitumumab starting 08/18/17 inpatient given her significant symptoms - Palliative care following, appreciate assistance with symptom control  Malignant ascites/recent history of spontaneous bacterial peritonitis -Status post large-volume paracentesis on 2/9, 5 L removed  - repeat paracentesis 2/11, almost another 5 L removed - 2/14 paracentesis 4.5 L removed - she's reaccumulated fluid rapidly. Distension seems to be stable today.  Will continue to monitor, use therapeutic paracentesis as needed and consider other options if this seems to be Clevester Helzer frequent occurrence.  - she's been on cipro since 2/8 here at the hospital  and looking at most recent d/c summary it looks like it was written for her to start on 2/2.  I have not seen any culture data, though per her d/c summary, looks like she did have greater than 250 neutrophils.  Abx d/c'd.   -Significant pain, palliative care following, appreciate recs (dilaudid PCA, fentanyl patch being increased, PCA being decreased, phenergan)  Elevated LFTs: suspect this is related to chemotherapy.  Follow RUQ [place] and hepatitis panel.  Continue to monitor.   Protein calorie malnutrition -Dietary consult  Recent DVT  Left Lower extremity edema -Extensive DVT noted even on the CT scan of the left femoral and common femoral veins. -Continue Eliquis   Hyponatremia - Relatively stable.  Clinically with massive ascites and volume overload, but likely intravascular depletion with hypoalbuminemia.  Will continue to follow.  Received albumin 2/14 after paracentesis and received blood on 2/15.  Serum osm low, urine Na low, urine osm high which supports that she is likely intravascularly depleted.    Sinus tachycardia - slightly better today, continue to monitor, consider beta blocker -TSH slightly elevated.  Her tachycardia was worked up at [place] without any clear cause but thought to be due to progressive cancer -Free T4 1.25, unclear significance of TFT's in setting of her acute illness, consider repeating this as outpatient - start metoprolol 12.5 mg BID for tachycardia  Anemia:  S/p 1 unit pRBC on 2/15.  No evidence of bleeding.  Her H/H has fluctuated since she's been here from 9-7.1.  Today  Stable at 8.3.  Labs suggestive of AOCD.     Hyperkalemia: improved.  Chronic pain -Palliative following  Goals of care -She is Dystany Duffy full code  DVT prophylaxis: eliquis Code Status: full  Family Communication: none at  bedside Disposition Plan: pending    Consultants:   Palliative Care  Oncology  Procedures:   US guided paracentesis 2/9  US guided  paracentesis 2/11  US guided paracentesis 2/14  Antimicrobials: Anti-infectives (From admission, onward)   Start     Dose/Rate Route Frequency Ordered Stop   08/18/17 1100  doxycycline (VIBRA-TABS) tablet 100 mg     100 mg Oral Every 12 hours 08/18/17 1009     08/12/17 2000  ciprofloxacin (CIPRO) tablet 500 mg     500 mg Oral 2 times daily 08/12/17 1800 08/17/17 2012       Subjective: Doing ok. Distension seems stable.  Objective: Vitals:   08/21/17 0356 08/21/17 0455 08/21/17 0911 08/21/17 1307  BP: 117/83     Pulse: (!) 121     Resp: 12 13 17 16   Temp: 98.3 F (36.8 C)     TempSrc: Oral     SpO2: 100% 99% 100% 98%  Weight:      Height:        Intake/Output Summary (Last 24 hours) at 08/21/2017 1440 Last data filed at 08/21/2017 0356 Gross per 24 hour  Intake 2 ml  Output 550 ml  Net -548 ml   Filed Weights   08/12/17 1700 08/14/17 0553 08/18/17 0725  Weight: 67.7 kg (149 lb 4 oz) 66.2 kg (145 lb 15.1 oz) 61.6 kg (135 lb 12.8 oz)    Examination:  General: No acute distress. Cardiovascular: Heart sounds show Anapaula Severt regular rate, and rhythm. No gallops or rubs. No murmurs. No JVD. Lungs: Clear to auscultation bilaterally with good air movement. No rales, rhonchi or wheezes. Abdomen: Soft, nontender, distended. Ostomy, pink stoma.  Neurological: Alert and oriented 3. Moves all extremities 4. Cranial nerves II through XII grossly intact. Skin: Warm and dry. No rashes or lesions. Extremities: No clubbing or cyanosis. No edema.  Psychiatric: Mood and affect are normal. Insight and judgment are appropriate.     Data Reviewed: I have personally reviewed following labs and imaging studies  CBC: Recent Labs  Lab 08/17/17 0500 08/18/17 0500 08/19/17 0334 08/19/17 1825 08/20/17 0355 08/21/17 1129  WBC 9.2 9.6 5.4  --  6.4 5.4  HGB 7.9* 8.0* 6.9* 8.2* 8.1* 8.3*  HCT 24.0* 24.7* 20.5* 25.1* 24.7* 26.3*  MCV 81.1 81.3 80.4  --  81.0 81.7  PLT 279 303 274  --   259 852   Basic Metabolic Panel: Recent Labs  Lab 08/18/17 0500 08/19/17 0334 08/20/17 0355 08/21/17 1129  NA 126* 126* 128* 129*  K 5.1 5.4* 4.6 4.0  CL 93* 93* 96* 98*  CO2 22 24 25 25   GLUCOSE 119* 125* 97 123*  BUN 12 18 16 10   CREATININE 0.76 0.83 0.59 0.48  CALCIUM 8.2* 8.3* 8.3* 8.1*  MG 1.8 2.4 2.0 1.9   GFR: Estimated Creatinine Clearance: 87.4 mL/min (by C-G formula based on SCr of 0.48 mg/dL). Liver Function Tests: Recent Labs  Lab 08/18/17 0500 08/19/17 0334 08/20/17 0355 08/21/17 1129  AST 25 19 59* 208*  ALT 9* 9* 18 106*  ALKPHOS 154* 130* 124 216*  BILITOT 0.2* 0.5 0.7 0.6  PROT 5.8* 5.8* 5.5* 5.4*  ALBUMIN 1.5* 2.0* 1.8* 1.7*   No results for input(s): LIPASE, AMYLASE in the last 168 hours. No results for input(s): AMMONIA in the last 168 hours. Coagulation Profile: No results for input(s): INR, PROTIME in the last 168 hours. Cardiac Enzymes: No results for input(s): CKTOTAL, CKMB, CKMBINDEX, TROPONINI in the last  168 hours. BNP (last 3 results) No results for input(s): PROBNP in the last 8760 hours. HbA1C: No results for input(s): HGBA1C in the last 72 hours. CBG: No results for input(s): GLUCAP in the last 168 hours. Lipid Profile: No results for input(s): CHOL, HDL, LDLCALC, TRIG, CHOLHDL, LDLDIRECT in the last 72 hours. Thyroid Function Tests: No results for input(s): TSH, T4TOTAL, FREET4, T3FREE, THYROIDAB in the last 72 hours. Anemia Panel: Recent Labs    08/20/17 0355  VITAMINB12 1,185*  FOLATE >40.0  FERRITIN 1,036*  TIBC 113*  IRON 92   Sepsis Labs: No results for input(s): PROCALCITON, LATICACIDVEN in the last 168 hours.  Recent Results (from the past 240 hour(s))  Culture, body fluid-bottle     Status: None   Collection Time: 08/13/17 10:15 AM  Result Value Ref Range Status   Specimen Description PERITONEAL  Final   Special Requests NONE  Final   Culture   Final    NO GROWTH 5 DAYS Performed at [facility], [address] [place], [place] [address]    Report Status 08/18/2017 FINAL  Final  Gram stain     Status: None   Collection Time: 08/13/17 10:15 AM  Result Value Ref Range Status   Specimen Description PERITONEAL  Final   Special Requests NONE  Final   Gram Stain   Final    FEW WBC PRESENT,BOTH PMN AND MONONUCLEAR NO ORGANISMS SEEN Performed at [facility], [address] [place], [place] [address]    Report Status 08/13/2017 FINAL  Final  Culture, body fluid-bottle     Status: None (Preliminary result)   Collection Time: 08/18/17 11:46 AM  Result Value Ref Range Status   Specimen Description PERITONEAL  Final   Special Requests NONE  Final   Culture   Final    NO GROWTH 3 DAYS Performed at [facility] [address], [place], [place] [address]    Report Status PENDING  Incomplete  Gram stain     Status: None   Collection Time: 08/18/17 11:46 AM  Result Value Ref Range Status   Specimen Description PERITONEAL  Final   Special Requests NONE  Final   Gram Stain   Final    MODERATE WBC PRESENT,BOTH PMN AND MONONUCLEAR NO ORGANISMS SEEN Performed at [facility], [address] [place],  [address]    Report Status 08/18/2017 FINAL  Final         Radiology Studies: No results found.      Scheduled Meds: . apixaban  5 mg Oral BID  . doxycycline  100 mg Oral Q12H  . feeding supplement (ENSURE ENLIVE)  237 mL Oral BID BM  . fentaNYL  150 mcg Transdermal Q72H  . metoprolol tartrate  12.5 mg Oral BID  . polyethylene glycol  17 g Oral BID  . senna-docusate  2 tablet Oral BID   Continuous Infusions: . sodium chloride    . sodium chloride    . sodium chloride    . famotidine       LOS: 9 days    Time spent: over 30 min    Fayrene Helper, MD [facility] Pager [phone]  If 7PM-7AM, please contact night-coverage [email or web address] Password [email or web address] 08/21/2017, 2:40 PM

## 2017-08-21 NOTE — Progress Notes (Signed)
Daily Progress Note   Patient Name: Amber Silva       Date: 08/21/2017 DOB: 1990/07/20  Age: 27 y.o. MRN#: [id number] Attending Physician: Elodia Florence., * Primary Care Physician: [name]., MD Admit Date: 08/11/2017  Reason for Consultation/Follow-up: Establishing goals of care and Pain control  Subjective: Reviewed chart and discussed with her bedside RN.    On examination, she was awake and lying in bed.  States she had one episode of worsened pain last evening.    Pain currently well controlled (received 7.5mg  dilaudid in the last 24 hours via PCA).  We talked about plan to get off of PCA, and she reports wanting to work toward this today.  She did get one dose MSIR 15mg  last evening and reports that it was helpful to her.  She would like to continue with this for rescue medication.  Length of Stay: 9  Current Medications: Scheduled Meds:  . apixaban  5 mg Oral BID  . doxycycline  100 mg Oral Q12H  . feeding supplement (ENSURE ENLIVE)  237 mL Oral BID BM  . fentaNYL  150 mcg Transdermal Q72H  . HYDROmorphone   Intravenous Q4H  . polyethylene glycol  17 g Oral BID  . senna-docusate  2 tablet Oral BID    Continuous Infusions: . sodium chloride    . sodium chloride    . sodium chloride    . famotidine      PRN Meds: sodium chloride, albuterol, alteplase, diphenhydrAMINE **OR** diphenhydrAMINE, diphenhydrAMINE, diphenhydrAMINE, EPINEPHrine, EPINEPHrine, EPINEPHrine, EPINEPHrine, famotidine, heparin lock flush, heparin lock flush, hydrocortisone cream, HYDROmorphone, lactulose, methylPREDNISolone sodium succinate, morphine, naloxone **AND** sodium chloride flush, ondansetron (ZOFRAN) IV, promethazine, sodium chloride flush, sodium chloride flush, sodium chloride  flush  Physical Exam      Constitutional: She appears lethargic. She appears cachectic. She is cooperative. She appears ill.  Pulmonary/Chest: Effort normal.  Abdominal: She exhibits distension.  Mildly distended s/p Paracentesis   Musculoskeletal:       Right shoulder: She exhibits decreased strength.  Neurological: She appears lethargic.   Vital Signs: BP 117/83 (BP Location: Right Arm)   Pulse (!) 121   Temp 98.3 F (36.8 C) (Oral)   Resp 13   Ht 5\' 3"  (1.6 m)   Wt 61.6 kg (135 lb 12.8 oz)  SpO2 99%   BMI 24.06 kg/m  SpO2: SpO2: 99 % O2 Device: O2 Device: Not Delivered O2 Flow Rate: O2 Flow Rate (L/min): 0 L/min  Intake/output summary:   Intake/Output Summary (Last 24 hours) at 08/21/2017 0849 Last data filed at 08/21/2017 0356 Gross per 24 hour  Intake 496.5 ml  Output 950 ml  Net -453.5 ml   LBM: Last BM Date: 08/19/17(Colostomy) Baseline Weight: Weight: 63.5 kg (140 lb) Most recent weight: Weight: 61.6 kg (135 lb 12.8 oz)       Palliative Assessment/Data:      Patient Active Problem List   Diagnosis Date Noted  . Rectal cancer ([place]) 08/18/2017  . Malnutrition of moderate degree 08/15/2017  . Malignant ascites 08/12/2017  . Abdominal distention 08/12/2017  . Hyponatremia 08/12/2017    Palliative Care Assessment & Plan   Patient Profile: 27 y.o. female  admitted on 08/11/2017 with abdominal pain and swelling. She has a PMH of metastatic rectal cancer, ascites, s/p colostomy.  She has been receiving Oncology treatments and care at [facility] She was initially diagnosed with rectal cancer July 2018. She underwent a loop ileostomy on 02/16/2017 and completed 4 cycles of FOLFIRNOX with last treatment 04/27/2017. She completed a partial course of radiation and Xeloda to pelvic area. Recent CT abdomen/pelvis showed a large rectal mass that appeared necrotic and with discrete tract extending toward the vagina/perineum; interval development  of massive ascites; diffuse caking of the omentum; descending colostomy bulging due to peritoneal metastases; hepatic metastasesmeasuring up to 4.7 cm in the upper right liver; large thrombus in the left femoral and common femoral veins.   Assessment: Patient Active Problem List   Diagnosis Date Noted  . Rectal cancer ([place]) 08/18/2017  . Malnutrition of moderate degree 08/15/2017  . Malignant ascites 08/12/2017  . Abdominal distention 08/12/2017  . Hyponatremia 08/12/2017   Recommendations/Plan:  Full code  Increased fentanyl patch to 154mcg/hr.  Will work to transition to oral regimen.  She reports getting relief from [facility] 15mg , so will begin with this as rescue medication (oral morphine equivalent of 150mg  over the past 24 hours from dilaudid.  With dose reduction for cross tolerance, will plan for MSIR 15mg  Q 3 hours prn).  Will also leave hydromorphone 0.5mg  IV as secondary pain medication to be given 40 minutes after oral medication if oral route is ineffective.  Phenergan PRN for nausea/vomiting   Palliative team will continue to support patient, family, and providers during hospitalization  Would consider Palliative to follow up post-discharge at [facility] for symptom management and support   Goals of Care and Additional Recommendations:  Limitations on Scope of Treatment: Full Scope Treatment  Code Status:    Code Status Orders  (From admission, onward)        Start     Ordered   08/12/17 1753  Full code  Continuous     08/12/17 1753    Code Status History    Date Active Date Inactive Code Status Order ID Comments User Context   This patient has a current code status but no historical code status.       Prognosis:   Guarded  Discharge Planning:  To Be Determined  Care plan was discussed with patient, RN  Thank you for allowing the Palliative Medicine Team to assist in the care of this patient.   Total Time 25 Prolonged Time Billed No        Greater than 50%  of this time  was spent counseling and coordinating care related to the above assessment and plan.  Micheline Rough, MD  Please contact Palliative Medicine Team phone at [phone] for questions and concerns.

## 2017-08-22 ENCOUNTER — Inpatient Hospital Stay (HOSPITAL_COMMUNITY): Payer: Medicaid Other

## 2017-08-22 ENCOUNTER — Other Ambulatory Visit: Payer: Self-pay | Admitting: Nurse Practitioner

## 2017-08-22 DIAGNOSIS — C20 Malignant neoplasm of rectum: Secondary | ICD-10-CM

## 2017-08-22 LAB — HEPATIC FUNCTION PANEL
ALT: 66 U/L — ABNORMAL HIGH (ref 14–54)
AST: 73 U/L — AB (ref 15–41)
Albumin: 1.6 g/dL — ABNORMAL LOW (ref 3.5–5.0)
Alkaline Phosphatase: 231 U/L — ABNORMAL HIGH (ref 38–126)
BILIRUBIN DIRECT: 0.2 mg/dL (ref 0.1–0.5)
BILIRUBIN TOTAL: 0.9 mg/dL (ref 0.3–1.2)
Indirect Bilirubin: 0.7 mg/dL (ref 0.3–0.9)
Total Protein: 5.4 g/dL — ABNORMAL LOW (ref 6.5–8.1)

## 2017-08-22 LAB — BASIC METABOLIC PANEL
Anion gap: 9 (ref 5–15)
BUN: 7 mg/dL (ref 6–20)
CALCIUM: 8.2 mg/dL — AB (ref 8.9–10.3)
CO2: 24 mmol/L (ref 22–32)
CREATININE: 0.34 mg/dL — AB (ref 0.44–1.00)
Chloride: 96 mmol/L — ABNORMAL LOW (ref 101–111)
GFR calc Af Amer: >60 mL/min (ref >60)
GFR calc non Af Amer: >60 mL/min (ref >60)
GLUCOSE: 92 mg/dL (ref 65–99)
Potassium: 4.2 mmol/L (ref 3.5–5.1)
Sodium: 129 mmol/L — ABNORMAL LOW (ref 135–145)

## 2017-08-22 LAB — CBC
HEMATOCRIT: 25.9 % — AB (ref 36.0–46.0)
Hemoglobin: 8.2 g/dL — ABNORMAL LOW (ref 12.0–15.0)
MCH: 26 pg (ref 26.0–34.0)
MCHC: 31.7 g/dL (ref 30.0–36.0)
MCV: 82.2 fL (ref 78.0–100.0)
Platelets: 213 10*3/uL (ref 150–400)
RBC: 3.15 MIL/uL — ABNORMAL LOW (ref 3.87–5.11)
RDW: 16.6 % — AB (ref 11.5–15.5)
WBC: 6.4 10*3/uL (ref 4.0–10.5)

## 2017-08-22 MED ORDER — PRO-STAT SUGAR FREE PO LIQD
30.0000 mL | Freq: Two times a day (BID) | ORAL | Status: DC
  Administered 2017-08-22 – 2017-08-23 (×2): 30 mL via ORAL
  Filled 2017-08-22 (×3): qty 30

## 2017-08-22 MED ORDER — KETOROLAC TROMETHAMINE 30 MG/ML IJ SOLN
30.0000 mg | Freq: Once | INTRAMUSCULAR | Status: DC
  Filled 2017-08-22: qty 1

## 2017-08-22 MED ORDER — ALBUMIN HUMAN 25 % IV SOLN
25.0000 g | Freq: Once | INTRAVENOUS | Status: AC
  Administered 2017-08-22: 25 g via INTRAVENOUS
  Filled 2017-08-22: qty 100

## 2017-08-22 MED ORDER — LIDOCAINE HCL 1 % IJ SOLN
INTRAMUSCULAR | Status: AC
  Filled 2017-08-22: qty 10

## 2017-08-22 NOTE — Progress Notes (Signed)
Nutrition Follow-up  DOCUMENTATION CODES:   Non-severe (moderate) malnutrition in context of chronic illness  INTERVENTION:   -Continue Ensure Enlive po BID, each supplement provides 350 kcal and 20 grams of protein -Provide Prostat liquid protein PO 30 ml BID with meals, each supplement provides 100 kcal, 15 grams protein.  NUTRITION DIAGNOSIS:   Moderate Malnutrition related to chronic illness, cancer and cancer related treatments as evidenced by moderate fat depletion, moderate muscle depletion.  Ongoing.  GOAL:   Patient will meet greater than or equal to 90% of their needs  Progressing.  MONITOR:   PO intake, Supplement acceptance, Labs, Weight trends, I & O's  ASSESSMENT:   Pt with PMH of stage IV adenocarcinoma of the rectum with metastasis to the liver and peritoneal s/p sigmoid colostomy, chemotherapy, and palliative radiation therapy presents with abdominal distention and discomfort found malignant ascites  Paracentesis: 2/9: yielded 5L 2/11: yielded ~5L 2/14: yielded 4.5L  Pt having another paracentesis performed for her abdominal distention. Currently consuming very little. On 2/16 pt ate 25-75% of meals. Drank an Ensure as well. Will continue supplements as pt has reported that she enjoys them when her fluid is controlled. Will also order Prostat supplements for nutrition with less volume. Pt's weight now -10 lb since 2/10.   Medications: IV Phenergan PRN Labs reviewed: Low Na  Diet Order:  Diet regular Room service appropriate? Yes; Fluid consistency: Thin  EDUCATION NEEDS:   Not appropriate for education at this time  Skin:  Skin Assessment: Reviewed RN Assessment  Last BM:  08/14/17  Height:   Ht Readings from Last 1 Encounters:  08/12/17 5\' 3"  (1.6 m)    Weight:   Wt Readings from Last 1 Encounters:  08/18/17 135 lb 12.8 oz (61.6 kg)    Ideal Body Weight:  52.3 kg  BMI:  Body mass index is 24.06 kg/m.  Estimated Nutritional Needs:    Kcal:  8242-3536  Protein:  80-95 grams  Fluid:  >/= 1.9 L/d  Clayton Bibles, MS, RD, LDN [place] [facility] Pager: [phone] After Hours Pager: [phone]

## 2017-08-22 NOTE — Progress Notes (Signed)
Daily Progress Note   Patient Name: Amber Silva       Date: 08/22/2017 DOB: Jan 25, 1991  Age: 27 y.o. MRN#: [id number] Attending Physician: Amber Silva., * Primary Care Physician: Amber Silva., MD Admit Date: 08/11/2017  Reason for Consultation/Follow-up: Establishing goals of care, Non pain symptom management, Pain control and Psychosocial/spiritual support  Subjective: Chart reviewed and discussed with bedside RN and Dr. Florene Silva.  Patient is s/p Paracentesis on today yielding 4.1L. She is resting in bed easily arousalble. States current pain regimen with MSIR PO and fentanyl patch is controlling her pain. (Received  1.0mg  IV  Dilaudid and 45mg  MSIR PO in the last 24 hours). She is also on 150 mcg fentanyl patch. Reports she continues to have good output via colostomy.   Length of Stay: 10  Current Medications: Scheduled Meds:  . apixaban  5 mg Oral BID  . doxycycline  100 mg Oral Q12H  . feeding supplement (ENSURE ENLIVE)  237 mL Oral BID BM  . feeding supplement (PRO-STAT SUGAR FREE 64)  30 mL Oral BID  . fentaNYL  150 mcg Transdermal Q72H  . ketorolac  30 mg Intravenous Once  . lidocaine      . metoprolol tartrate  12.5 mg Oral BID  . polyethylene glycol  17 g Oral BID  . senna-docusate  2 tablet Oral BID    Continuous Infusions: . sodium chloride    . sodium chloride    . sodium chloride    . famotidine      PRN Meds: sodium chloride, albuterol, alteplase, diphenhydrAMINE, diphenhydrAMINE, EPINEPHrine, EPINEPHrine, EPINEPHrine, EPINEPHrine, famotidine, heparin lock flush, heparin lock flush, hydrocortisone cream, HYDROmorphone (DILAUDID) injection, lactulose, methylPREDNISolone sodium succinate, morphine, ondansetron (ZOFRAN) IV, promethazine, sodium chloride  flush, sodium chloride flush, sodium chloride flush  Physical Exam       Constitutional: She appearslethargic. She appearscachectic. She iscooperative. Sheappears ill.  Pulmonary/Chest:Effort normal.  Abdominal: She exhibitsdistension. Mildly distended s/p Paracentesiswith some tenderness on palpation  Musculoskeletal:  She exhibits decreased strength.  Neurological: She appearslethargic.    Vital Signs: BP 108/74 (BP Location: Right Arm)   Pulse (!) 119   Temp 98.3 F (36.8 C) (Oral)   Resp 16   Ht 5\' 3"  (1.6 m)   Wt 61.6 kg (135 lb 12.8 oz)   SpO2 100%  BMI 24.06 kg/m  SpO2: SpO2: 100 % O2 Device: O2 Device: Not Delivered O2 Flow Rate: O2 Flow Rate (L/min): 0 L/min  Intake/output summary:   Intake/Output Summary (Last 24 hours) at 08/22/2017 1337 Last data filed at 08/22/2017 0615 Gross per 24 hour  Intake 240 ml  Output 275 ml  Net -35 ml   LBM: Last BM Date: 08/19/17(Colostomy) Baseline Weight: Weight: 63.5 kg (140 lb) Most recent weight: Weight: 61.6 kg (135 lb 12.8 oz)       Palliative Assessment/Data: PPS 30%      Patient Active Problem List   Diagnosis Date Noted  . Rectal cancer ([place]) 08/18/2017  . Malnutrition of moderate degree 08/15/2017  . Malignant ascites 08/12/2017  . Abdominal distention 08/12/2017  . Hyponatremia 08/12/2017    Palliative Care Assessment & Plan   Patient Profile: 27 y.o.femaleadmitted on 2/7/2019with abdominal pain and swelling. She has a PMH of metastatic rectal cancer, ascites, s/p colostomy. She has been receiving Oncology treatments and care at [facility]. She was initially diagnosed with rectal cancer July 2018. She underwent a loop ileostomy on 02/16/2017 and completed 4 cycles of FOLFIRNOX with last treatment 04/27/2017. She completed a partial course of radiation and Xeloda to pelvic area. Recent CTabdomen/pelvis showed a large rectal mass that appeared necrotic and with discrete  tract extending toward the vagina/perineum; interval development of massive ascites; diffuse caking of the omentum; descending colostomy bulging due to peritoneal metastases; hepatic metastasesmeasuring up to 4.7 cm in the upper right liver; large thrombus in the left femoral and common femoral veins.  Assessment:  Rectal cancer   Malnutrition of moderate degree   Malignant ascites   Abdominal distention  Hyponatremia   Recommendations/Plan:  Full code  Continue pain management with MSIR 15mg  and fentanyl 127mcg patch with plan to be discharged home int he next 24-48hrs (patient states this regimen is effectively controlling pain)  Phenergan PRN for nausea/vomiting   Palliative team will continue to support patient, family, and providers during hospitalization  Would consider Palliative to follow up post-discharge at [facility] for symptom management and support  Goals of Care and Additional Recommendations:  Limitations on Scope of Treatment: Full Scope Treatment  Code Status:    Code Status Orders  (From admission, onward)        Start     Ordered   08/12/17 1753  Full code  Continuous     08/12/17 1753    Code Status History    Date Active Date Inactive Code Status Order ID Comments User Context   This patient has a current code status but no historical code status.      Discharge Planning:  Home with Palliative Services via [facility] for symptom management if patient is agreeable.   Care plan was discussed with patient, bedside nurse, Dr. Florene Silva, and Dr. Benay Silva.   Thank you for allowing the Palliative Medicine Team to assist in the care of this patient.   Total Time 30 min.  Prolonged Time Billed  No      Greater than 50%  of this time was spent counseling and coordinating care related to the above assessment and plan.  Amber Silva, AGNP-C Palliative Medicine Team  Phone: [phone] Fax: [phone]  Please contact  Palliative Medicine Team phone at [phone] for questions and concerns.    ADDENDUM:   I saw and examined Amber Silva in conjunction with Amber Lea, NP.  She reports that pain has been adequately managed  on current regimen of fentanyl with MSIR for rescue medication.  Would continue same on discharge.  I have reviewed and discussed the care of this patient in detail with the nurse practitioner including pertinent patient records, physical exam findings and data. I agree with details of this encounter.  See above for further details.  Total time: 30 minutes Greater than 50%  of this time was spent counseling and coordinating care related to the above assessment and plan.  Micheline Rough, MD [facility] Team [phone]

## 2017-08-22 NOTE — Progress Notes (Signed)
PROGRESS NOTE    Amber Silva  AVW:[id number] DOB: [birth year] DOA: 08/11/2017 PCP: [name]., MD   Brief Narrative:  27 y.o.femalewith medical history significant ofstage IV adenocarcinoma of the rectum status post sigmoid colostomy, chemotherapy, palliative radiation therapy, cancer metastasized tothe liverand peritoneal metastatic disease, with massive malignant ascites, who gets all of her care at [facility] and is looking for a second opinion here at [facility], presents to the emergency room with progressive abdominal distention and discomfort.  She has recently developed malignant ascites with her first paracentesis last week, and at that time she was also diagnosed with SBP and placed on ciprofloxacin.  She was found to have DVT recently and she did not Eliquis since.  There are concerns based on [facility] notes that she has pericardial metastasis.  Assessment & Plan:   Active Problems:   Malignant ascites   Abdominal distention   Hyponatremia   Malnutrition of moderate degree   Rectal cancer (HCC)  Metastatic rectal cancer -With extensive peritoneal metastatic disease, hepatic metastatic disease, concern for pericardial metastasis, with ascites.  Recently diagnosed with SBP on ciprofloxacin -Evaluated by Dr. Benay Spice, plan for FOLFIRI/Panitumumab starting 08/18/17 inpatient given her significant symptoms - Palliative care following, appreciate assistance with symptom control  Malignant ascites/recent history of spontaneous bacterial peritonitis -Status post large-volume paracentesis on 2/9, 5 L removed  - repeat paracentesis 2/11, almost another 5 L removed - 2/14 paracentesis 4.5 L removed - 2/18 paracentesis 4/1 L removed - she's reaccumulated fluid rapidly. Distension improved after paracentesis.  Will continue to monitor, use therapeutic paracentesis as needed and consider other options if this seems to be a frequent occurrence.  - she's been on  cipro since 2/8 here at the hospital and looking at most recent d/c summary it looks like it was written for her to start on 2/2.  I have not seen any culture data, though per her d/c summary, looks like she did have greater than 250 neutrophils.  Abx d/c'd.   -Significant pain, palliative care following, appreciate recs (dilaudid PCA, fentanyl patch being increased, PCA being decreased, phenergan)  Elevated LFTs: suspect this is related to chemotherapy.  Follow RUQ [place] (mets to liver) and hepatitis panel (pending).  Continue to monitor.  Improved today.   Protein calorie malnutrition -Dietary consult  Recent DVT  Left Lower extremity edema -Extensive DVT noted even on the CT scan of the left femoral and common femoral veins. -Continue Eliquis   Hyponatremia - Relatively stable.  Clinically with massive ascites and volume overload, but likely intravascular depletion with hypoalbuminemia.  Will continue to follow.  Received albumin 2/14 after paracentesis and received blood on 2/15.  Albumin 2/18 post para.  Serum osm low, urine Na low, urine osm high which supports that she is likely intravascularly depleted.    Sinus tachycardia - slightly better today, continue to monitor, consider beta blocker -TSH slightly elevated.  Her tachycardia was worked up at [facility] without any clear cause but thought to be due to progressive cancer -Free T4 1.25, unclear significance of TFT's in setting of her acute illness, consider repeating this as outpatient - start metoprolol 12.5 mg BID for tachycardia  Anemia:  S/p 1 unit pRBC on 2/15.  No evidence of bleeding.  Her H/H has fluctuated since she's been here from 9-7.1.  Today  Stable at 8.2.  Labs suggestive of AOCD.     Hyperkalemia: improved.  Chronic pain -Palliative following  Goals of care -She  is a full code  DVT prophylaxis: eliquis Code Status: full  Family Communication: none at bedside Disposition Plan: pending     Consultants:   Palliative Care  Oncology  Procedures:   US guided paracentesis 2/9  US guided paracentesis 2/11  US guided paracentesis 2/14  US guided paracentesis 2/18  Antimicrobials: Anti-infectives (From admission, onward)   Start     Dose/Rate Route Frequency Ordered Stop   08/18/17 1100  doxycycline (VIBRA-TABS) tablet 100 mg     100 mg Oral Every 12 hours 08/18/17 1009     08/12/17 2000  ciprofloxacin (CIPRO) tablet 500 mg     500 mg Oral 2 times daily 08/12/17 1800 08/17/17 2012       Subjective: Doing ok after paracentesis.  Objective: Vitals:   08/22/17 1200 08/22/17 1205 08/22/17 1210 08/22/17 1600  BP: 98/76 98/68 108/74 109/66  Pulse:    (!) 116  Resp:    17  Temp:    98 F (36.7 C)  TempSrc:    Oral  SpO2:    100%  Weight:      Height:        Intake/Output Summary (Last 24 hours) at 08/22/2017 1630 Last data filed at 08/22/2017 0615 Gross per 24 hour  Intake 240 ml  Output 275 ml  Net -35 ml   Filed Weights   08/12/17 1700 08/14/17 0553 08/18/17 0725  Weight: 67.7 kg (149 lb 4 oz) 66.2 kg (145 lb 15.1 oz) 61.6 kg (135 lb 12.8 oz)    Examination:  General: No acute distress. Cardiovascular: Heart sounds show a regular rate, and rhythm. No gallops or rubs. No murmurs. No JVD. Lungs: Clear to auscultation bilaterally with good air movement. No rales, rhonchi or wheezes. Abdomen: Soft, ttp, distended, but improved post para. Ostomy with Servidio liquid stool, pink stoma.  Neurological: Alert and oriented 3. Moves all extremities 4. Cranial nerves II through XII grossly intact. Skin: Warm and dry. No rashes or lesions. Extremities: No clubbing or cyanosis. No edema Psychiatric: Mood and affect are normal. Insight and judgment are approrpiate.   Data Reviewed: I have personally reviewed following labs and imaging studies  CBC: Recent Labs  Lab 08/18/17 0500 08/19/17 0334 08/19/17 1825 08/20/17 0355 08/21/17 1129 08/22/17 0500   WBC 9.6 5.4  --  6.4 5.4 6.4  HGB 8.0* 6.9* 8.2* 8.1* 8.3* 8.2*  HCT 24.7* 20.5* 25.1* 24.7* 26.3* 25.9*  MCV 81.3 80.4  --  81.0 81.7 82.2  PLT 303 274  --  259 212 665   Basic Metabolic Panel: Recent Labs  Lab 08/18/17 0500 08/19/17 0334 08/20/17 0355 08/21/17 1129 08/22/17 0500  NA 126* 126* 128* 129* 129*  K 5.1 5.4* 4.6 4.0 4.2  CL 93* 93* 96* 98* 96*  CO2 22 24 25 25 24   GLUCOSE 119* 125* 97 123* 92  BUN 12 18 16 10 7   CREATININE 0.76 0.83 0.59 0.48 0.34*  CALCIUM 8.2* 8.3* 8.3* 8.1* 8.2*  MG 1.8 2.4 2.0 1.9  --    GFR: Estimated Creatinine Clearance: 87.4 mL/min (A) (by C-G formula based on SCr of 0.34 mg/dL (L)). Liver Function Tests: Recent Labs  Lab 08/18/17 0500 08/19/17 0334 08/20/17 0355 08/21/17 1129 08/22/17 0500  AST 25 19 59* 208* 73*  ALT 9* 9* 18 106* 66*  ALKPHOS 154* 130* 124 216* 231*  BILITOT 0.2* 0.5 0.7 0.6 0.9  PROT 5.8* 5.8* 5.5* 5.4* 5.4*  ALBUMIN 1.5* 2.0* 1.8* 1.7* 1.6*  No results for input(s): LIPASE, AMYLASE in the last 168 hours. No results for input(s): AMMONIA in the last 168 hours. Coagulation Profile: No results for input(s): INR, PROTIME in the last 168 hours. Cardiac Enzymes: No results for input(s): CKTOTAL, CKMB, CKMBINDEX, TROPONINI in the last 168 hours. BNP (last 3 results) No results for input(s): PROBNP in the last 8760 hours. HbA1C: No results for input(s): HGBA1C in the last 72 hours. CBG: No results for input(s): GLUCAP in the last 168 hours. Lipid Profile: No results for input(s): CHOL, HDL, LDLCALC, TRIG, CHOLHDL, LDLDIRECT in the last 72 hours. Thyroid Function Tests: No results for input(s): TSH, T4TOTAL, FREET4, T3FREE, THYROIDAB in the last 72 hours. Anemia Panel: Recent Labs    08/20/17 0355  VITAMINB12 1,185*  FOLATE >40.0  FERRITIN 1,036*  TIBC 113*  IRON 92   Sepsis Labs: No results for input(s): PROCALCITON, LATICACIDVEN in the last 168 hours.  Recent Results (from the past 240  hour(s))  Culture, body fluid-bottle     Status: None   Collection Time: 08/13/17 10:15 AM  Result Value Ref Range Status   Specimen Description PERITONEAL  Final   Special Requests NONE  Final   Culture   Final    NO GROWTH 5 DAYS Performed at [facility], [address], [place], [place] [address]    Report Status 08/18/2017 FINAL  Final  Gram stain     Status: None   Collection Time: 08/13/17 10:15 AM  Result Value Ref Range Status   Specimen Description PERITONEAL  Final   Special Requests NONE  Final   Gram Stain   Final    FEW WBC PRESENT,BOTH PMN AND MONONUCLEAR NO ORGANISMS SEEN Performed at [facility], [address], [place], [place] [address]    Report Status 08/13/2017 FINAL  Final  Culture, body fluid-bottle     Status: None (Preliminary result)   Collection Time: 08/18/17 11:46 AM  Result Value Ref Range Status   Specimen Description PERITONEAL  Final   Special Requests NONE  Final   Culture   Final    NO GROWTH 4 DAYS Performed at [facility] [address] [place], [place] [address]    Report Status PENDING  Incomplete  Gram stain     Status: None   Collection Time: 08/18/17 11:46 AM  Result Value Ref Range Status   Specimen Description PERITONEAL  Final   Special Requests NONE  Final   Gram Stain   Final    MODERATE WBC PRESENT,BOTH PMN AND MONONUCLEAR NO ORGANISMS SEEN Performed at [facility], [address], [place],  [address]    Report Status 08/18/2017 FINAL  Final         Radiology Studies: US Paracentesis  Result Date: 08/22/2017 INDICATION: Patient with history of metastatic rectal carcinoma, recurrent ascites. Request made for therapeutic paracentesis up to 6 liters. EXAM: ULTRASOUND GUIDED THERAPEUTIC PARACENTESIS MEDICATIONS: None. COMPLICATIONS: None immediate. PROCEDURE: Informed written consent was obtained from the patient after a discussion of the risks, benefits and alternatives to treatment.  A timeout was performed prior to the initiation of the procedure. Initial ultrasound scanning demonstrates a large amount of ascites within the right lower abdominal quadrant. The right lower abdomen was prepped and draped in the usual sterile fashion. 1% lidocaine was used for local anesthesia. Following this, a Yueh catheter was introduced. An ultrasound image was saved for documentation purposes. The paracentesis was performed. The catheter was  removed and a dressing was applied. The patient tolerated the procedure well without immediate post procedural complication. FINDINGS: A total of approximately 4.1 liters of bloody fluid was removed. IMPRESSION: Successful ultrasound-guided therapeutic paracentesis yielding 4.1 liters of peritoneal fluid. Read by: Rowe Robert, PA-C Electronically Signed   By: Jacqulynn Cadet M.D.   On: 08/22/2017 12:31   US Abdomen Limited Ruq  Result Date: 08/22/2017 CLINICAL DATA:  27 year old female with abnormal LFTs. Metastatic rectal cancer. Recurrent ascites. EXAM: ULTRASOUND ABDOMEN LIMITED RIGHT UPPER QUADRANT COMPARISON:  Ultrasound-guided paracentesis 08/18/2017. CT Abdomen and Pelvis 08/11/2017, and earlier. FINDINGS: Gallbladder: Gallbladder wall thickness remains normal at 2 millimeters. No echogenic sludge or stones identified. No definite sonographic Murphy sign. Common bile duct: Diameter: 3 millimeters, normal. Liver: Multiple hypoechoic liver masses, the largest in the right lobe is estimated at 5.3 cm diameter (probably corresponding to the 4.7 cm mass demonstrated by CT on 08/11/2017. No intrahepatic biliary ductal dilatation. Portal vein is patent on color Doppler imaging with normal direction of blood flow towards the liver (image 63). Other findings: Moderate volume right upper quadrant ascites appears stable to mildly regressed compared to 08/18/2017. the patient underwent subsequent ultrasound-guided paracentesis to this exam. IMPRESSION: 1. Metastatic  disease to the liver. The largest right lobe metastasis may be mildly increased since 08/11/2017. 2. Recurrent ascites. The patient underwent ultrasound-guided paracentesis following this exam (reported separately). 3. Negative gallbladder.  No evidence of biliary obstruction. Electronically Signed   By: Genevie Ann M.D.   On: 08/22/2017 13:00        Scheduled Meds: . apixaban  5 mg Oral BID  . doxycycline  100 mg Oral Q12H  . feeding supplement (ENSURE ENLIVE)  237 mL Oral BID BM  . feeding supplement (PRO-STAT SUGAR FREE 64)  30 mL Oral BID  . fentaNYL  150 mcg Transdermal Q72H  . ketorolac  30 mg Intravenous Once  . lidocaine      . metoprolol tartrate  12.5 mg Oral BID  . polyethylene glycol  17 g Oral BID  . senna-docusate  2 tablet Oral BID   Continuous Infusions: . sodium chloride    . sodium chloride    . sodium chloride    . famotidine       LOS: 10 days    Time spent: over 30 min    Fayrene Helper, MD [facility] Pager [phone]  If 7PM-7AM, please contact night-coverage [email or web address] Password [facility] 08/22/2017, 4:30 PM

## 2017-08-22 NOTE — Procedures (Signed)
Ultrasound-guided  therapeutic paracentesis performed yielding 4.1 liters of bloody fluid. No immediate complications.

## 2017-08-22 NOTE — Progress Notes (Signed)
PT Cancellation Note  Patient Details Name: Yarieliz Wasser MRN: [id number] DOB: [birth year]   Cancelled Treatment:    Reason Eval/Treat Not Completed: Patient declined, no reason specified patient declines PT this morning, possibly willing to attempt later in the day. Plan to attempt to return as/if able and schedule allows.    Deniece Ree PT, DPT, CBIS  Supplemental Physical Therapist [facility]   Pager [phone]

## 2017-08-22 NOTE — Progress Notes (Signed)
IP PROGRESS NOTE  Subjective:   Amber Silva reports adequate pain control with the current regimen.  She has ambulated in the hall.  She continues to have abdominal distention.  She would like another paracentesis today.  Objective: Vital signs in last 24 hours: Blood pressure 113/73, pulse (!) 119, temperature 98.3 F (36.8 C), temperature source Oral, resp. rate 16, height 5' 3"  (1.6 m), weight 135 lb 12.8 oz (61.6 kg), SpO2 100 %.  Intake/Output from previous day: 02/17 0701 - 02/18 0700 In: 240 [P.O.:240] Out: 275 [Urine:275]  Physical Exam: Lungs: Clear anteriorly, no respiratory distress Cardiac: Regular rate and rhythm, tachycardia Abdomen: Distended, diffuse tenderness, left lower quadrant colostomy  Extremities: Trace edema throughout the left leg Skin: No rash, palms without erythema  Portacath/PICC-without erythema  Lab Results: Recent Labs    08/21/17 1129 08/22/17 0500  WBC 5.4 6.4  HGB 8.3* 8.2*  HCT 26.3* 25.9*  PLT 212 213    BMET Recent Labs    08/20/17 0355 08/21/17 1129  NA 128* 129*  K 4.6 4.0  CL 96* 98*  CO2 25 25  GLUCOSE 97 123*  BUN 16 10  CREATININE 0.59 0.48  CALCIUM 8.3* 8.1*   AST 73, ALT 66, bilirubin 0.9 Medications: I have reviewed the patient's current medications.  Assessment/Plan: 1. Rectal cancer July 2018   Colonoscopy 01/25/2017-biopsy showed invasive moderately differentiated adenocarcinoma with mucinous features.    CT chest/abdomen/pelvis 02/11/2017-very large ulcerated rectal mass with severe wall thickening over the distal 11.2 cm of the rectum extending to the anus with surrounding perirectal adenopathy and pelvic sidewall adenopathy.  At least 2 metastatic lesions in the right hepatic lobe.   MRI of the pelvis 02/14/2017-T4b, N2 bulky rectal mass with deep infiltrative invasion of the bilateral pelvic sidewalls, presacral space, mesorectal fascia, distal branches of the internal iliac vasculature and sacral plexus.   There was abnormal signal intensity along both sciatic nerves possibly representing perineural extension.  The mass was noted to approximate the posterior vaginal wall without definite invasion.    PET scan 02/15/2017-colorectal malignancy with mesorectal fascia infiltration and 2 FDG avid hepatic metastases.  Regional lymphadenopathy found on prior imaging was not FDG avid on the PET scan.    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   Foundation 1-KRAS/NRAS wild-type; microsatellite stable; tumor mutational burden 3; BRCA1 rearrangement intron 2  4 cycles of FOLFIRINOX 03/09/2017 through 04/27/2017.    CT abdomen/pelvis 05/06/2017-large rectal mass and known hepatic metastases similar to recent comparison.    Status post partial course of radiation/Xeloda.   CTs 07/12/2017-large necrotic invasive rectal mass; increased size of known liver metastatic lesions; interval increase in size of peritoneal implants; new development of moderate volume abdominal ascites; gas again present within the anterior rectal wall closely opposing the gas-filled vagina.  Rectovaginal fistula not excluded.    Admitted to [facility] 08/12/2017 with abdominal pain and swelling  CT abdomen/pelvis 08/11/2017-large rectal mass that appeared necrotic and with discrete tract extending toward the vagina/perineum; interval development of massive ascites; diffuse caking of the omentum; descending colostomy bulging due to peritoneal metastases; hepatic metastases measuring up to 4.7 cm in the upper right liver; large thrombus in the left femoral and common femoral veins.  Cycle 1 FOLFIRI/panitumumab 08/18/2017 2. Left lower extremity DVT.  On Eliquis. 3. Ascites secondary to #1. 4. Tachycardia-likely secondary to anemia, intravascular depletion, critical illness-improved 08/19/2017 5. Elevated liver enzymes-better today, etiology unclear   Ms. Vath appears stable.  She has  adequate pain control on the current  Duragesic/MSIR regimen.  The abdomen remains distended.  I will order a therapeutic paracentesis for today.    Recommendations: 1.  Continue Duragesic/MSIR for pain 2.  Therapeutic paracentesis today 3.  Outpatient follow-up at the cancer center for cycle 2 FOLFIRI/panitumumab 08/25/2017 4.  Continue MiraLAX, resume lactulose for persistent constipation   LOS: 10 days   Amber Coder, MD   08/22/2017, 7:07 AM

## 2017-08-23 ENCOUNTER — Ambulatory Visit: Payer: Medicaid Other | Admitting: Nurse Practitioner

## 2017-08-23 ENCOUNTER — Other Ambulatory Visit: Payer: Medicaid Other

## 2017-08-23 ENCOUNTER — Ambulatory Visit: Payer: Medicaid Other | Admitting: Oncology

## 2017-08-23 ENCOUNTER — Ambulatory Visit: Payer: Medicaid Other

## 2017-08-23 LAB — COMPREHENSIVE METABOLIC PANEL
ALT: 38 U/L (ref 14–54)
AST: 35 U/L (ref 15–41)
Albumin: 2 g/dL — ABNORMAL LOW (ref 3.5–5.0)
Alkaline Phosphatase: 191 U/L — ABNORMAL HIGH (ref 38–126)
Anion gap: 8 (ref 5–15)
BILIRUBIN TOTAL: 0.6 mg/dL (ref 0.3–1.2)
BUN: 6 mg/dL (ref 6–20)
CALCIUM: 8.1 mg/dL — AB (ref 8.9–10.3)
CO2: 25 mmol/L (ref 22–32)
CREATININE: 0.4 mg/dL — AB (ref 0.44–1.00)
Chloride: 97 mmol/L — ABNORMAL LOW (ref 101–111)
Glucose, Bld: 100 mg/dL — ABNORMAL HIGH (ref 65–99)
Potassium: 4 mmol/L (ref 3.5–5.1)
SODIUM: 130 mmol/L — AB (ref 135–145)
Total Protein: 5.2 g/dL — ABNORMAL LOW (ref 6.5–8.1)

## 2017-08-23 LAB — CBC
HEMATOCRIT: 21.6 % — AB (ref 36.0–46.0)
HEMOGLOBIN: 7.1 g/dL — AB (ref 12.0–15.0)
MCH: 26.8 pg (ref 26.0–34.0)
MCHC: 32.9 g/dL (ref 30.0–36.0)
MCV: 81.5 fL (ref 78.0–100.0)
Platelets: 184 10*3/uL (ref 150–400)
RBC: 2.65 MIL/uL — AB (ref 3.87–5.11)
RDW: 16.7 % — ABNORMAL HIGH (ref 11.5–15.5)
WBC: 5.7 10*3/uL (ref 4.0–10.5)

## 2017-08-23 LAB — CULTURE, BODY FLUID-BOTTLE: CULTURE: NO GROWTH

## 2017-08-23 LAB — HEMOGLOBIN AND HEMATOCRIT, BLOOD
HCT: 23.9 % — ABNORMAL LOW (ref 36.0–46.0)
HEMOGLOBIN: 7.7 g/dL — AB (ref 12.0–15.0)

## 2017-08-23 LAB — CULTURE, BODY FLUID W GRAM STAIN -BOTTLE

## 2017-08-23 LAB — HEPATITIS PANEL, ACUTE
HCV Ab: 0.1 s/co ratio (ref 0.0–0.9)
Hep A IgM: NEGATIVE
Hep B C IgM: NEGATIVE
Hepatitis B Surface Ag: NEGATIVE

## 2017-08-23 LAB — MAGNESIUM: Magnesium: 1.8 mg/dL (ref 1.7–2.4)

## 2017-08-23 MED ORDER — METOPROLOL TARTRATE 25 MG PO TABS: 12.5000 mg | ORAL_TABLET | Freq: Two times a day (BID) | ORAL | 0 refills | Status: DC

## 2017-08-23 MED ORDER — FENTANYL 75 MCG/HR TD PT72: 150.0000 ug | MEDICATED_PATCH | TRANSDERMAL | 0 refills | Status: DC

## 2017-08-23 MED ORDER — DOXYCYCLINE HYCLATE 100 MG PO TABS: 100.0000 mg | ORAL_TABLET | Freq: Two times a day (BID) | ORAL | 0 refills | Status: DC

## 2017-08-23 MED ORDER — SENNOSIDES-DOCUSATE SODIUM 8.6-50 MG PO TABS: 2.0000 | ORAL_TABLET | Freq: Two times a day (BID) | ORAL | 0 refills | Status: DC

## 2017-08-23 MED ORDER — PROMETHAZINE HCL 12.5 MG PO TABS: 12.5000 mg | ORAL_TABLET | Freq: Four times a day (QID) | ORAL | 0 refills | Status: DC | PRN

## 2017-08-23 MED ORDER — POLYETHYLENE GLYCOL 3350 17 G PO PACK: 17.0000 g | PACK | Freq: Two times a day (BID) | ORAL | 0 refills | Status: DC

## 2017-08-23 MED ORDER — LACTULOSE 10 GM/15ML PO SOLN: 20.0000 g | Freq: Two times a day (BID) | ORAL | 0 refills | Status: DC | PRN

## 2017-08-23 NOTE — Progress Notes (Signed)
Physical Therapy Treatment Patient Details Name: Amber Silva MRN: [id number] DOB: [birth year] Today's Date: 08/23/2017    History of Present Illness 27 y.o. 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 Recent DVT- on Eliquis    PT Comments    Pt ambulated 180' with IV pole, distance limited by fatigue/nausea/R thigh pain. HR up to 140 with ambulation.    Follow Up Recommendations  Home health PT;Supervision/Assistance - 24 hour     Equipment Recommendations  None recommended by PT    Recommendations for Other Services       Precautions / Restrictions Precautions Precaution Comments: colostomy, monitor HR Restrictions Weight Bearing Restrictions: No    Mobility  Bed Mobility Overal bed mobility: Modified Independent             General bed mobility comments: HOB up  Transfers Overall transfer level: Modified independent                  Ambulation/Gait Ambulation/Gait assistance: Supervision Ambulation Distance (Feet): 180 Feet Assistive device: (IV pole) Gait Pattern/deviations: Decreased step length - right;Decreased step length - left;Shuffle   Gait velocity interpretation: Below normal speed for age/gender General Gait Details: HR up to 140 with ambulation, pt reported nausea after walking (she reports this is baseline), pain meds requested, no LOB, distance limited by fatigue   Stairs            Wheelchair Mobility    Modified Rankin (Stroke Patients Only)       Balance Overall balance assessment: Needs assistance   Sitting balance-Leahy Scale: Good     Standing balance support: During functional activity Standing balance-Leahy Scale: Fair                              Cognition Arousal/Alertness:  Awake/alert Behavior During Therapy: WFL for tasks assessed/performed Overall Cognitive Status: Within Functional Limits for tasks assessed                                        Exercises      General Comments        Pertinent Vitals/Pain Pain Score: 5  Pain Location: R anterior thigh Pain Descriptors / Indicators: Burning Pain Intervention(s): Limited activity within patient's tolerance;Monitored during session;Patient requesting pain meds-RN notified;Premedicated before session    Home Living                      Prior Function            PT Goals (current goals can now be found in the care plan section) Acute Rehab PT Goals Patient Stated Goal: get stronger PT Goal Formulation: With patient Time For Goal Achievement: 09/03/17 Potential to Achieve Goals: Good Progress towards PT goals: Progressing toward goals    Frequency    Min 3X/week      PT Plan      Co-evaluation              AM-PAC PT "6 Clicks" Daily Activity  Outcome Measure  Difficulty turning over in bed (including adjusting bedclothes, sheets and blankets)?: A Little Difficulty moving from lying on back to sitting on the side of the bed? :  A Little Difficulty sitting down on and standing up from a chair with arms (e.g., wheelchair, bedside commode, etc,.)?: A Little Help needed moving to and from a bed to chair (including a wheelchair)?: None Help needed walking in hospital room?: A Little Help needed climbing 3-5 steps with a railing? : A Lot 6 Click Score: 18    End of Session   Activity Tolerance: Patient limited by fatigue Patient left: in chair;with call bell/phone within reach;Other (comment)(MD in room) Nurse Communication: Mobility status PT Visit Diagnosis: Muscle weakness (generalized) (M62.81);Difficulty in walking, not elsewhere classified (R26.2)     Time: 1017-5102 PT Time Calculation (min) (ACUTE ONLY): 23 min  Charges:  $Gait Training:  8-22 mins $Therapeutic Activity: 8-22 mins                    G Codes:          Philomena Doheny 08/23/2017, 10:13 AM [phone]

## 2017-08-23 NOTE — Progress Notes (Signed)
Daily Progress Note   Patient Name: Amber Silva       Date: 08/23/2017 DOB: 10-30-1990  Age: 27 y.o. MRN#: [id number] Attending Physician: Elodia Florence., * Primary Care Physician: [name]., MD Admit Date: 08/11/2017  Reason for Consultation/Follow-up: Non pain symptom management, Pain control and Psychosocial/spiritual support  Subjective: Chart has been reviewed and discussed with Dr. Florene Glen.   Patient is more alert today. She seems eager to be discharged so that she can get home to her children. She denies any current pain and verbalizes that she feels that her current regimen is better controlling her pain (Fentanyl patch 150mg  and MSIR 15mg  PO PRN). She is aware that she will be discharged on this same regimen since it is working. Continues to have good output from colostomy.   Length of Stay: 11  Current Medications: Scheduled Meds:  . apixaban  5 mg Oral BID  . doxycycline  100 mg Oral Q12H  . feeding supplement (ENSURE ENLIVE)  237 mL Oral BID BM  . feeding supplement (PRO-STAT SUGAR FREE 64)  30 mL Oral BID  . fentaNYL  150 mcg Transdermal Q72H  . metoprolol tartrate  12.5 mg Oral BID  . polyethylene glycol  17 g Oral BID  . senna-docusate  2 tablet Oral BID    Continuous Infusions: . sodium chloride    . sodium chloride    . sodium chloride    . famotidine      PRN Meds: sodium chloride, albuterol, alteplase, diphenhydrAMINE, diphenhydrAMINE, EPINEPHrine, EPINEPHrine, EPINEPHrine, EPINEPHrine, famotidine, heparin lock flush, heparin lock flush, hydrocortisone cream, HYDROmorphone (DILAUDID) injection, lactulose, methylPREDNISolone sodium succinate, morphine, ondansetron (ZOFRAN) IV, promethazine, sodium chloride flush, sodium chloride flush, sodium  chloride flush  Physical Exam  Constitutional: She is oriented to person, place, and time.  Neurological: She is alert and oriented to person, place, and time.  Pulmonary/Chest:Effort normal.  Abdominal: She exhibits milddistension.  s/p Paracentesiswith some tenderness on palpation  Musculoskeletal:  She exhibits decreased strength.  She appearscachectic. Sheappears ill.         Vital Signs: BP (!) 114/93 (BP Location: Right Arm)   Pulse (!) 126   Temp 98.2 F (36.8 C) (Oral)   Resp 14   Ht 5\' 3"  (1.6 m)   Wt 61.6 kg (135 lb 12.8 oz)  SpO2 100%   BMI 24.06 kg/m  SpO2: SpO2: 100 % O2 Device: O2 Device: Not Delivered O2 Flow Rate: O2 Flow Rate (L/min): 0 L/min  Intake/output summary:   Intake/Output Summary (Last 24 hours) at 08/23/2017 1545 Last data filed at 08/23/2017 1410 Gross per 24 hour  Intake 360 ml  Output 400 ml  Net -40 ml   LBM: Last BM Date: 08/19/17(Colostomy) Baseline Weight: Weight: 63.5 kg (140 lb) Most recent weight: Weight: 61.6 kg (135 lb 12.8 oz)       Palliative Assessment/Data: PPS 40%    Flowsheet Rows     Most Recent Value  Intake Tab  Referral Department  Hospitalist  Unit at Time of Referral  Med/Surg Unit  Palliative Care Primary Diagnosis  Cancer  Date Notified  08/14/17  Palliative Care Type  New Palliative care  Reason for referral  Clarify Goals of Care, Non-pain Symptom, Pain, Counsel Regarding Hospice  Date of Admission  08/11/17  Date first seen by Palliative Care  08/15/17  # of days Palliative referral response time  1 Day(s)  # of days IP prior to Palliative referral  3  Clinical Assessment  Psychosocial & Spiritual Assessment  Palliative Care Outcomes      Patient Active Problem List   Diagnosis Date Noted  . Rectal cancer ([place]) 08/18/2017  . Malnutrition of moderate degree 08/15/2017  . Malignant ascites 08/12/2017  . Abdominal distention 08/12/2017  . Hyponatremia 08/12/2017    Palliative Care  Assessment & Plan   Patient Profile: 27 y.o.femaleadmitted on 2/7/2019with abdominal pain and swelling. She has a PMH of metastatic rectal cancer, ascites, s/p colostomy. She has been receiving Oncology treatments and care at [facility]. She was initially diagnosed with rectal cancer July 2018. She underwent a loop ileostomy on 02/16/2017 and completed 4 cycles of FOLFIRNOX with last treatment 04/27/2017. She completed a partial course of radiation and Xeloda to pelvic area. Recent CTabdomen/pelvis showed a large rectal mass that appeared necrotic and with discrete tract extending toward the vagina/perineum; interval development of massive ascites; diffuse caking of the omentum; descending colostomy bulging due to peritoneal metastases; hepatic metastasesmeasuring up to 4.7 cm in the upper right liver; large thrombus in the left femoral and common femoral veins.  Assessment:  Rectal cancer   Malnutrition of moderate degree   Malignant ascites   Abdominal distention  Hyponatremia   Recommendations/Plan:  Full code  Continue pain management with MSIR 15mg  and fentanyl 150mcg patch at discharge (patient states this regimen is effectively controlling pain)  Phenergan PRN for nausea/vomiting   Follow up with Dr. Benay Spice after discharge (patient verbalizes she is aware when to contact office)   Would consider Palliative to follow up post-discharge at [facility] for symptom management and support  Home with PT for strengthening per PT eval  Goals of Care and Additional Recommendations:  Limitations on Scope of Treatment: Full Scope Treatment  Code Status:    Code Status Orders  (From admission, onward)        Start     Ordered   08/12/17 1753  Full code  Continuous     08/12/17 1753    Code Status History    Date Active Date Inactive Code Status Order ID Comments User Context   This patient has a current code status but no historical code  status.      Discharge Planning:  Home with Palliative Services via [facility] for symptom management if patient is  agreeable. Continue on current pain regimen. Home PT per PT evaluation.    Care plan was discussed with patient and bedside RN.   Thank you for allowing the Palliative Medicine Team to assist in the care of this patient.   Total Time 25 min.  Prolonged Time Billed  No       Greater than 50%  of this time was spent counseling and coordinating care related to the above assessment and plan.  Alda Lea, AGNP-C Palliative Medicine Team  Phone: [phone] Fax: [phone]   Please contact Palliative Medicine Team phone at [phone] for questions and concerns.

## 2017-08-23 NOTE — Discharge Summary (Signed)
Physician Discharge Summary  Amber Silva:[id number] DOB: [birth year] DOA: 08/11/2017  PCP: [name]., MD  Admit date: 08/11/2017 Discharge date: 08/23/2017  Time spent: over 30 minutes  Recommendations for Outpatient Follow-up:  1. Follow up outpatient CBC/CMP (attention to LFT's and H/H) 2. Follow up with Dr. Benay Spice as outpatient for future chemotherapy 3. Follow ascites, repeat therapeutic tap as needed 4. Discharged with fentanyl patch 150 mcg to last until her next appointment with Dr. Benay Spice.  She should have enough MSIR 15 mg tablets to last until her appointment next week (she said she had at least 36, which would allow her to take up to 4 tablets daily until her follow up appointment).  Further pain med refills per Dr. Benay Spice.  PMP aware reviewed.  5.  Consider repeat TFT as outpatient 6. Follow tachycardia, started on metop here  Discharge Diagnoses:  Active Problems:   Malignant ascites   Abdominal distention   Hyponatremia   Malnutrition of moderate degree   Rectal cancer [facility])   Discharge Condition: stable, guarded  Diet recommendation: heart healthy  Filed Weights   08/12/17 1700 08/14/17 0553 08/18/17 0725  Weight: 67.7 kg (149 lb 4 oz) 66.2 kg (145 lb 15.1 oz) 61.6 kg (135 lb 12.8 oz)    History of present illness:  27 y.o.femalewith medical history significant ofstage IV adenocarcinoma of the rectum status post sigmoid colostomy, chemotherapy, palliative radiation therapy, cancer metastasized tothe liverand peritoneal metastatic disease, with massive malignant ascites, who gets all of her care at [facility] and is looking for Amber Silva second opinion here at [facility] health, presents to the emergency room with progressive abdominal distention and discomfort. She has recently developed malignant ascites with her first paracentesis last week, and at that time she was also diagnosed with SBP and placed on ciprofloxacin. She was found to have DVT  recently and she did not Eliquis since. There are concerns based on [facility] notes that she has pericardial metastasis.  She was started on chemotherapy (folfiri/panitumumab) on 2/14.  She had several therapeutic paracentesis given rapidly accumulating ascitic fluid.  Her pain medications were adjusted and she was discharged on 2/19.  She required transfusion x1.  On the day of discharge her H/H was downtrending in the AM, but repeat in the afternoon was improved.  She was discharged with plans for outpatient follow up.  Hospital Course:  Metastatic rectal cancer -With extensive peritoneal metastatic disease, hepatic metastatic disease, concern for pericardial metastasis, with ascites. Recently diagnosed with SBP on ciprofloxacin -Evaluated by Dr.Sherrill,started on FOLFIRI/Panitumumab on 08/18/17 inpatient given her significant symptoms -Palliative care following, appreciate assistance with symptom control  Malignant ascites/recent history of spontaneous bacterial peritonitis -Status post large-volume paracentesis on 2/9, 5 L removed  - repeat paracentesis 2/11, almost another 5 L removed - 2/14 paracentesis 4.5 L removed - 2/18 paracentesis 4/1 L removed - she's reaccumulated fluid rapidly. Distension improved after paracentesis.  Will continue to monitor, use therapeutic paracentesis as needed and consider other options if this seems to be Amber Silva frequent occurrence.  - cipro discontinued  - On PCA here for pain, appreciate palliative care assistance, discharged with fentanyl and MSIR  Elevated LFTs: suspect this is related to chemotherapy.  Follow RUQ [place] (mets to liver) and hepatitis panel (ngeative).  Continue to monitor.  Improved on discharge.   Protein calorie malnutrition -Dietary consult  Recent DVT  Left Lower extremity edema -Extensive DVT noted even on the CT scan of the left femoral and  common femoral veins. -Continue Eliquis   Hyponatremia - Relatively stable.   Clinically with massive ascites and volume overload, but likely intravascular depletion with hypoalbuminemia.  Will continue to follow.  Received albumin 2/14 after paracentesis and received blood on 2/15.  Albumin 2/18 post para.  Serum osm low, urine Na low, urine osm high which supports that she is likely intravascularly depleted.    Sinus tachycardia - improved with beta blocker -TSH slightly elevated. Her tachycardia was worked up at [facility] without any clear cause but thought to be due to progressive cancer -Free T4 1.25, unclear significance of TFT's in setting of her acute illness, consider repeating this as outpatient  Anemia:  S/p 1 unit pRBC on 2/15.  No evidence of bleeding.  Her H/H has fluctuated since she's been here from 9-7.1.  Initially 7.1, but improved to 7.7 on repeat.  Plan to discharge with outpatient follow up. Labs suggestive of AOCD.     Hyperkalemia: improved.  Chronic pain -Palliative following  Goals of care -She is Amber Silva full code  Procedures:  US guided paracentesis 2/9  US guided paracentesis 2/11  US guided paracentesis 2/14  US guided paracentesis 2/18  Consultations:  Palliative care  Oncology  Discharge Exam: Vitals:   08/23/17 0417 08/23/17 1411  BP: 109/73 (!) 114/93  Pulse: 86 (!) 126  Resp: 12 14  Temp: 97.9 F (36.6 C) 98.2 F (36.8 C)  SpO2: 100% 100%   Feeling better.  Ready to go home.  General: No acute distress. Cardiovascular: Heart sounds show Amber Silva regular rate, and rhythm. No gallops or rubs. No murmurs. No JVD. Lungs: Clear to auscultation bilaterally with good air movement. No rales, rhonchi or wheezes. Abdomen: Soft, distended, diffusely tender (stable exam), Ostomy with pink stoma and [place] stool Neurological: Alert and oriented 3. Moves all extremities 4 with equal strength. Cranial nerves II through XII grossly intact. Skin: Warm and dry. No rashes or lesions. Extremities: Left lower extremity  edema Psychiatric: Mood and affect are normal. Insight and judgment are appropriate.   Discharge Instructions   Discharge Instructions    Call MD for:  difficulty breathing, headache or visual disturbances   Complete by:  As directed    Call MD for:  persistant dizziness or light-headedness   Complete by:  As directed    Call MD for:  persistant nausea and vomiting   Complete by:  As directed    Call MD for:  severe uncontrolled pain   Complete by:  As directed    Call MD for:  temperature >100.4   Complete by:  As directed    Diet - low sodium heart healthy   Complete by:  As directed    Diet - low sodium heart healthy   Complete by:  As directed    Discharge instructions   Complete by:  As directed    You were seen for metastatic cancer.    We started chemotherapy for you here.    Please follow up with Dr. Benay Spice as scheduled as an outpatient.   You may continue to need occasional paracentesis for comfort, Dr. Benay Spice should be able to assist with this.    We'll discharge you with enough pain medication to last until your follow up with Dr. Benay Spice.  Please follow up as planned with Dr. Benay Spice.  Continue your bowel regimen.  Use lactulose as needed for constipation that does not improve with miralax and senna.   Please ask your PCP to  request records from this hospitalization so they know what was done and what the next steps are.    Return if you have new, recurrent, or worsening symptoms.   Increase activity slowly   Complete by:  As directed    Increase activity slowly   Complete by:  As directed    TREATMENT CONDITIONS   Complete by:  As directed    Patient should have magnesium within 7 days prior to chemotherapy administration. NOTIFY MD if magnesium level is < 1.7 or if patient has unstable vital signs: Temperature > 38.5, SBP > 180 or < 90, RR > 30 or HR > 100.   TREATMENT CONDITIONS   Complete by:  As directed    Patient should have CBC & CMP within  7 days prior to chemotherapy administration. NOTIFY MD IF: ANC < 1500, Hemoglobin < 8, PLT < 100,000,  Total Bili > 1.5, Creatinine > 1.5, ALT & AST > 80 or if patient has unstable vital signs: Temperature > 38.5, SBP > 180 or < 90, RR > 30 or HR > 100.     Allergies as of 08/23/2017   No Known Allergies     Medication List    STOP taking these medications   fentaNYL 50 MCG/HR Commonly known as:  DURAGESIC - dosed mcg/hr Replaced by:  fentaNYL 75 MCG/HR     TAKE these medications   doxycycline 100 MG tablet Commonly known as:  VIBRA-TABS Take 1 tablet (100 mg total) by mouth every 12 (twelve) hours. (follow up with Dr. Benay Spice to determine duration)   ELIQUIS 5 MG Tabs tablet Generic drug:  apixaban Take 5 mg by mouth 2 (two) times daily.   fentaNYL 75 MCG/HR Commonly known as:  DURAGESIC - dosed mcg/hr Place 2 patches (150 mcg total) onto the skin every 3 (three) days for 9 days. Start taking on:  08/25/2017 Replaces:  fentaNYL 50 MCG/HR   haloperidol 0.5 MG tablet Commonly known as:  HALDOL Take 0.5 mg by mouth 2 (two) times daily as needed.   lactulose 10 GM/15ML solution Commonly known as:  CHRONULAC Take 30 mLs (20 g total) by mouth 2 (two) times daily as needed for mild constipation.   metoprolol tartrate 25 MG tablet Commonly known as:  LOPRESSOR Take 0.5 tablets (12.5 mg total) by mouth 2 (two) times daily.   morphine 15 MG tablet Commonly known as:  MSIR Take 15 mg by mouth every 2 (two) hours as needed.   polyethylene glycol packet Commonly known as:  MIRALAX / GLYCOLAX Take 17 g by mouth 2 (two) times daily.   potassium chloride 10 MEQ tablet Commonly known as:  K-DUR Take 10 mEq by mouth daily.   promethazine 12.5 MG tablet Commonly known as:  PHENERGAN Take 1 tablet (12.5 mg total) by mouth every 6 (six) hours as needed for nausea or vomiting.   senna-docusate 8.6-50 MG tablet Commonly known as:  Senokot-S Take 2 tablets by mouth 2 (two) times  daily.      No Known Allergies Follow-up Information    [facility].   Specialty:  Home Health Services Contact information: [address] [place] [address] [phone]            The results of significant diagnostics from this hospitalization (including imaging, microbiology, ancillary and laboratory) are listed below for reference.    Significant Diagnostic Studies: Dg Chest 2 View  Result Date: 08/12/2017 CLINICAL DATA:  Stage IV adenocarcinoma  of the rectum. EXAM: CHEST  2 VIEW COMPARISON:  July 06, 2017 FINDINGS: The study is limited due to low lung volumes. Roneshia Drew double lumen port terminates near the caval atrial junction. The heart, hila, mediastinum are normal. No pneumothorax. No other acute abnormalities. IMPRESSION: No acute abnormality identified.  Low lung volumes limit evaluation. Electronically Signed   By: Dorise Bullion III M.D   On: 08/12/2017 19:43   Ct Abdomen Pelvis W Contrast  Result Date: 08/11/2017 CLINICAL DATA:  Generalized acute abdominal pain EXAM: CT ABDOMEN AND PELVIS WITH CONTRAST TECHNIQUE: Multidetector CT imaging of the abdomen and pelvis was performed using the standard protocol following bolus administration of intravenous contrast. CONTRAST:  18mL ISOVUE-300 IOPAMIDOL (ISOVUE-300) INJECTION 61% COMPARISON:  04/17/2017. report from abdominal CT 07/28/2017 at [facility]. FINDINGS: Lower chest: Pericardial fluid that appears low-density. Porta catheter tip extends to the tricuspid valve. Atelectasis in the right more than left lower lobes. Hepatobiliary: Hepatic metastases that have increased from prior, up to 4.7 cm in the upper right liver. The liver is steatotic.No evidence of biliary obstruction or stone. Pancreas: Unremarkable. Spleen: Unremarkable. Adrenals/Urinary Tract: Negative adrenals. No hydronephrosis or stone. Unremarkable bladder. Stomach/Bowel: Patient has rectal cancer with large rectal mass that  is low-density peripherally and enhancing centrally. There is dystrophic calcifications about the mass which may reflect treatment change. The mass appears necrotic and with discrete track extending towards the vagina/perineum. This is known per comparison report. No pneumoperitoneum. There has been interval development of massive ascites with enhancing septations best seen about the liver, which is scalloped. There is diffuse caking of the omentum. Descending colostomy which is bulging due to peritoneal metastases. Diffuse stool in the colon. Negative for small bowel obstruction. Vascular/Lymphatic: Large thrombus in the left femoral and common femoral veins. No acute arterial finding. No mass or adenopathy. Reproductive:No pathologic findings. Other: Ascites noted above. Musculoskeletal: No acute abnormalities. Critical Value/emergent results were called by telephone at the time of interpretation on 08/11/2017 at 10:14 am to Dr. Nanda Quinton , who verbally acknowledged these results. IMPRESSION: 1. Rectal cancer with extensive peritoneal metastatic disease and massive ascites. The abdomen appears tense. Known hepatic metastatic disease. 2. Necrotic primary rectal cancer with visible tract into the vagina/perineum. 3. Extensive DVT in the left femoral and common femoral veins. 4. Anasarca and small pericardial effusion. Electronically Signed   By: Monte Fantasia M.D.   On: 08/11/2017 10:18   US Paracentesis  Result Date: 08/22/2017 INDICATION: Patient with history of metastatic rectal carcinoma, recurrent ascites. Request made for therapeutic paracentesis up to 6 liters. EXAM: ULTRASOUND GUIDED THERAPEUTIC PARACENTESIS MEDICATIONS: None. COMPLICATIONS: None immediate. PROCEDURE: Informed written consent was obtained from the patient after Myda Detwiler discussion of the risks, benefits and alternatives to treatment. Emmanuella Mirante timeout was performed prior to the initiation of the procedure. Initial ultrasound scanning demonstrates Camy Leder  large amount of ascites within the right lower abdominal quadrant. The right lower abdomen was prepped and draped in the usual sterile fashion. 1% lidocaine was used for local anesthesia. Following this, Baylon Santelli Yueh catheter was introduced. An ultrasound image was saved for documentation purposes. The paracentesis was performed. The catheter was removed and Cozy Veale dressing was applied. The patient tolerated the procedure well without immediate post procedural complication. FINDINGS: Charline Hoskinson total of approximately 4.1 liters of bloody fluid was removed. IMPRESSION: Successful ultrasound-guided therapeutic paracentesis yielding 4.1 liters of peritoneal fluid. Read by: Rowe Robert, PA-C Electronically Signed   By: Jacqulynn Cadet M.D.   On: 08/22/2017 12:31  US Paracentesis  Result Date: 08/18/2017 INDICATION: Patient with history of metastatic rectal cancer, recurrent ascites. Request made for diagnostic and therapeutic paracentesis. EXAM: ULTRASOUND GUIDED DIAGNOSTIC AND THERAPEUTIC PARACENTESIS MEDICATIONS: None. COMPLICATIONS: None immediate. PROCEDURE: Informed written consent was obtained from the patient after Travion Ke discussion of the risks, benefits and alternatives to treatment. Jermani Pund timeout was performed prior to the initiation of the procedure. Initial ultrasound scanning demonstrates Makayleigh Poliquin large amount of ascites within the right lower abdominal quadrant. The right lower abdomen was prepped and draped in the usual sterile fashion. 1% lidocaine was used for local anesthesia. Following this, Kollen Armenti Yueh catheter was introduced. An ultrasound image was saved for documentation purposes. The paracentesis was performed. The catheter was removed and Catlin Aycock dressing was applied. The patient tolerated the procedure well without immediate post procedural complication. FINDINGS: Iren Whipp total of approximately 4.4 liters of bloody fluid was removed. Samples were sent to the laboratory as requested by the clinical team. IMPRESSION: Successful  ultrasound-guided diagnostic and therapeutic paracentesis yielding 4.4 liters of peritoneal fluid. Read by: Rowe Robert, PA-C Electronically Signed   By: Sandi Mariscal M.D.   On: 08/18/2017 12:18   US Paracentesis  Result Date: 08/15/2017 INDICATION: Patient with history of metastatic rectal cancer, recurrent ascites. Request made for therapeutic paracentesis up to 6 liters. EXAM: ULTRASOUND GUIDED THERAPEUTIC PARACENTESIS MEDICATIONS: None. COMPLICATIONS: None immediate. PROCEDURE: Informed written consent was obtained from the patient after Canon Gola discussion of the risks, benefits and alternatives to treatment. Correll Denbow timeout was performed prior to the initiation of the procedure. Initial ultrasound scanning demonstrates Aubrina Nieman large amount of ascites within the left mid to lower abdominal quadrant. The left mid to lower abdomen was prepped and draped in the usual sterile fashion. 1% lidocaine was used for local anesthesia. Following this, Levana Minetti Yueh catheter was introduced. An ultrasound image was saved for documentation purposes. The paracentesis was performed. The catheter was removed and Umberto Pavek dressing was applied. The patient tolerated the procedure well without immediate post procedural complication. FINDINGS: Bracken Moffa total of approximately 4.8 liters of bloody fluid was removed. IMPRESSION: Successful ultrasound-guided therapeutic paracentesis yielding 4.8 liters of peritoneal fluid. Read by: Rowe Robert, PA-C Electronically Signed   By: Markus Daft M.D.   On: 08/15/2017 12:35   US Paracentesis  Result Date: 08/13/2017 INDICATION: Metastatic rectal cancer. Recurrent malignant ascites. Request for diagnostic and therapeutic paracentesis. EXAM: ULTRASOUND GUIDED RIGHT LATERAL ABDOMEN PARACENTESIS MEDICATIONS: 1% Lidocaine = 10 mL COMPLICATIONS: None immediate. PROCEDURE: Informed written consent was obtained from the patient after Ellanie Oppedisano discussion of the risks, benefits and alternatives to treatment. Kaena Santori timeout was performed prior to  the initiation of the procedure. Initial ultrasound scanning demonstrates Leen Tworek large amount of ascites within the right lower abdominal quadrant. The right lower abdomen was prepped and draped in the usual sterile fashion. 1% lidocaine with epinephrine was used for local anesthesia. Following this, Brendan Gadson 19 gauge, 7-cm, Yueh catheter was introduced. An ultrasound image was saved for documentation purposes. The paracentesis was performed. The catheter was removed and Drayson Dorko dressing was applied. The patient tolerated the procedure well without immediate post procedural complication. FINDINGS: Antwan Pandya total of approximately 5 liters of clear red fluid was removed. Samples were sent to the laboratory as requested by the clinical team. IMPRESSION: Successful ultrasound-guided paracentesis yielding 5 liters of peritoneal fluid. Read by: Gareth Eagle, PA-C Electronically Signed   By: Aletta Edouard M.D.   On: 08/13/2017 10:54   US Abdomen Limited Ruq  Result Date: 08/22/2017 CLINICAL DATA:  27 year old female with abnormal LFTs. Metastatic rectal cancer. Recurrent ascites. EXAM: ULTRASOUND ABDOMEN LIMITED RIGHT UPPER QUADRANT COMPARISON:  Ultrasound-guided paracentesis 08/18/2017. CT Abdomen and Pelvis 08/11/2017, and earlier. FINDINGS: Gallbladder: Gallbladder wall thickness remains normal at 2 millimeters. No echogenic sludge or stones identified. No definite sonographic Murphy sign. Common bile duct: Diameter: 3 millimeters, normal. Liver: Multiple hypoechoic liver masses, the largest in the right lobe is estimated at 5.3 cm diameter (probably corresponding to the 4.7 cm mass demonstrated by CT on 08/11/2017. No intrahepatic biliary ductal dilatation. Portal vein is patent on color Doppler imaging with normal direction of blood flow towards the liver (image 63). Other findings: Moderate volume right upper quadrant ascites appears stable to mildly regressed compared to 08/18/2017. the patient underwent subsequent ultrasound-guided  paracentesis to this exam. IMPRESSION: 1. Metastatic disease to the liver. The largest right lobe metastasis may be mildly increased since 08/11/2017. 2. Recurrent ascites. The patient underwent ultrasound-guided paracentesis following this exam (reported separately). 3. Negative gallbladder.  No evidence of biliary obstruction. Electronically Signed   By: Genevie Ann M.D.   On: 08/22/2017 13:00    Microbiology: Recent Results (from the past 240 hour(s))  Culture, body fluid-bottle     Status: None   Collection Time: 08/18/17 11:46 AM  Result Value Ref Range Status   Specimen Description PERITONEAL  Final   Special Requests NONE  Final   Culture   Final    NO GROWTH 5 DAYS Performed at [facility], [address], [place], [place] [address]    Report Status 08/23/2017 FINAL  Final  Gram stain     Status: None   Collection Time: 08/18/17 11:46 AM  Result Value Ref Range Status   Specimen Description PERITONEAL  Final   Special Requests NONE  Final   Gram Stain   Final    MODERATE WBC PRESENT,BOTH PMN AND MONONUCLEAR NO ORGANISMS SEEN Performed at [facility], [place] [address] [place], [place] [address]    Report Status 08/18/2017 FINAL  Final     Labs: Basic Metabolic Panel: Recent Labs  Lab 08/18/17 0500 08/19/17 0334 08/20/17 0355 08/21/17 1129 08/22/17 0500 08/23/17 0557  NA 126* 126* 128* 129* 129* 130*  K 5.1 5.4* 4.6 4.0 4.2 4.0  CL 93* 93* 96* 98* 96* 97*  CO2 22 24 25 25 24 25   GLUCOSE 119* 125* 97 123* 92 100*  BUN 12 18 16 10 7 6   CREATININE 0.76 0.83 0.59 0.48 0.34* 0.40*  CALCIUM 8.2* 8.3* 8.3* 8.1* 8.2* 8.1*  MG 1.8 2.4 2.0 1.9  --  1.8   Liver Function Tests: Recent Labs  Lab 08/19/17 0334 08/20/17 0355 08/21/17 1129 08/22/17 0500 08/23/17 0557  AST 19 59* 208* 73* 35  ALT 9* 18 106* 66* 38  ALKPHOS 130* 124 216* 231* 191*  BILITOT 0.5 0.7 0.6 0.9 0.6  PROT 5.8* 5.5* 5.4* 5.4* 5.2*  ALBUMIN 2.0* 1.8* 1.7* 1.6* 2.0*   No results for  input(s): LIPASE, AMYLASE in the last 168 hours. No results for input(s): AMMONIA in the last 168 hours. CBC: Recent Labs  Lab 08/19/17 0334  08/20/17 0355 08/21/17 1129 08/22/17 0500 08/23/17 0557 08/23/17 1432  WBC 5.4  --  6.4 5.4 6.4 5.7  --   HGB 6.9*   < > 8.1* 8.3* 8.2* 7.1* 7.7*  HCT 20.5*   < > 24.7* 26.3* 25.9* 21.6* 23.9*  MCV 80.4  --  81.0 81.7 82.2 81.5  --  PLT 274  --  259 212 213 184  --    < > = values in this interval not displayed.   Cardiac Enzymes: No results for input(s): CKTOTAL, CKMB, CKMBINDEX, TROPONINI in the last 168 hours. BNP: BNP (last 3 results) Recent Labs    07/06/17 1816  BNP 9.7    ProBNP (last 3 results) No results for input(s): PROBNP in the last 8760 hours.  CBG: No results for input(s): GLUCAP in the last 168 hours.     Signed:  Fayrene Helper MD.  [facility] 08/23/2017, 9:45 PM

## 2017-08-25 ENCOUNTER — Other Ambulatory Visit: Payer: Medicaid Other

## 2017-08-25 ENCOUNTER — Ambulatory Visit: Payer: Medicaid Other

## 2017-08-25 ENCOUNTER — Ambulatory Visit: Payer: Medicaid Other | Admitting: Oncology

## 2017-08-26 ENCOUNTER — Inpatient Hospital Stay (HOSPITAL_COMMUNITY)
Admission: EM | Admit: 2017-08-26 | Discharge: 2017-09-09 | DRG: 374 | Disposition: A | Discharge status: Home Health | Payer: Medicaid Other | Attending: Internal Medicine | Admitting: Internal Medicine

## 2017-08-26 ENCOUNTER — Other Ambulatory Visit: Payer: Self-pay

## 2017-08-26 ENCOUNTER — Emergency Department (HOSPITAL_COMMUNITY): Payer: Medicaid Other

## 2017-08-26 ENCOUNTER — Encounter (HOSPITAL_COMMUNITY): Payer: Self-pay | Admitting: Emergency Medicine

## 2017-08-26 DIAGNOSIS — K652 Spontaneous bacterial peritonitis: Secondary | ICD-10-CM | POA: Diagnosis present

## 2017-08-26 DIAGNOSIS — R109 Unspecified abdominal pain: Secondary | ICD-10-CM | POA: Diagnosis present

## 2017-08-26 DIAGNOSIS — K5900 Constipation, unspecified: Secondary | ICD-10-CM

## 2017-08-26 DIAGNOSIS — Z7189 Other specified counseling: Secondary | ICD-10-CM

## 2017-08-26 DIAGNOSIS — R188 Other ascites: Secondary | ICD-10-CM

## 2017-08-26 DIAGNOSIS — Z933 Colostomy status: Secondary | ICD-10-CM

## 2017-08-26 DIAGNOSIS — G893 Neoplasm related pain (acute) (chronic): Secondary | ICD-10-CM | POA: Diagnosis present

## 2017-08-26 DIAGNOSIS — Z7901 Long term (current) use of anticoagulants: Secondary | ICD-10-CM

## 2017-08-26 DIAGNOSIS — E8770 Fluid overload, unspecified: Secondary | ICD-10-CM | POA: Diagnosis present

## 2017-08-26 DIAGNOSIS — R1084 Generalized abdominal pain: Secondary | ICD-10-CM

## 2017-08-26 DIAGNOSIS — Z515 Encounter for palliative care: Secondary | ICD-10-CM

## 2017-08-26 DIAGNOSIS — R079 Chest pain, unspecified: Secondary | ICD-10-CM

## 2017-08-26 DIAGNOSIS — E871 Hypo-osmolality and hyponatremia: Secondary | ICD-10-CM | POA: Diagnosis present

## 2017-08-26 DIAGNOSIS — B370 Candidal stomatitis: Secondary | ICD-10-CM

## 2017-08-26 DIAGNOSIS — C787 Secondary malignant neoplasm of liver and intrahepatic bile duct: Secondary | ICD-10-CM | POA: Diagnosis present

## 2017-08-26 DIAGNOSIS — I959 Hypotension, unspecified: Secondary | ICD-10-CM | POA: Diagnosis present

## 2017-08-26 DIAGNOSIS — E440 Moderate protein-calorie malnutrition: Secondary | ICD-10-CM | POA: Diagnosis not present

## 2017-08-26 DIAGNOSIS — Z6823 Body mass index (BMI) 23.0-23.9, adult: Secondary | ICD-10-CM

## 2017-08-26 DIAGNOSIS — R112 Nausea with vomiting, unspecified: Secondary | ICD-10-CM

## 2017-08-26 DIAGNOSIS — R21 Rash and other nonspecific skin eruption: Secondary | ICD-10-CM | POA: Diagnosis present

## 2017-08-26 DIAGNOSIS — C20 Malignant neoplasm of rectum: Secondary | ICD-10-CM | POA: Diagnosis present

## 2017-08-26 DIAGNOSIS — I82412 Acute embolism and thrombosis of left femoral vein: Secondary | ICD-10-CM | POA: Diagnosis present

## 2017-08-26 DIAGNOSIS — R180 Malignant ascites: Secondary | ICD-10-CM | POA: Diagnosis present

## 2017-08-26 DIAGNOSIS — L8990 Pressure ulcer of unspecified site, unspecified stage: Secondary | ICD-10-CM

## 2017-08-26 DIAGNOSIS — Z79899 Other long term (current) drug therapy: Secondary | ICD-10-CM

## 2017-08-26 DIAGNOSIS — T451X5A Adverse effect of antineoplastic and immunosuppressive drugs, initial encounter: Secondary | ICD-10-CM | POA: Diagnosis present

## 2017-08-26 DIAGNOSIS — E43 Unspecified severe protein-calorie malnutrition: Secondary | ICD-10-CM

## 2017-08-26 DIAGNOSIS — D701 Agranulocytosis secondary to cancer chemotherapy: Secondary | ICD-10-CM

## 2017-08-26 DIAGNOSIS — C786 Secondary malignant neoplasm of retroperitoneum and peritoneum: Principal | ICD-10-CM | POA: Diagnosis present

## 2017-08-26 LAB — URINALYSIS, ROUTINE W REFLEX MICROSCOPIC
BILIRUBIN URINE: NEGATIVE
GLUCOSE, UA: NEGATIVE mg/dL
KETONES UR: 5 mg/dL — AB
Nitrite: NEGATIVE
PH: 7 (ref 5.0–8.0)
Protein, ur: NEGATIVE mg/dL
Specific Gravity, Urine: 1.013 (ref 1.005–1.030)
Squamous Epithelial / LPF: NONE SEEN

## 2017-08-26 LAB — COMPREHENSIVE METABOLIC PANEL
ALK PHOS: 156 U/L — AB (ref 38–126)
ALT: 18 U/L (ref 14–54)
ANION GAP: 14 (ref 5–15)
AST: 23 U/L (ref 15–41)
Albumin: 2.1 g/dL — ABNORMAL LOW (ref 3.5–5.0)
BUN: 6 mg/dL (ref 6–20)
CALCIUM: 8.2 mg/dL — AB (ref 8.9–10.3)
CO2: 21 mmol/L — ABNORMAL LOW (ref 22–32)
Chloride: 91 mmol/L — ABNORMAL LOW (ref 101–111)
Creatinine, Ser: 0.48 mg/dL (ref 0.44–1.00)
GFR calc Af Amer: >60 mL/min (ref >60)
GLUCOSE: 96 mg/dL (ref 65–99)
POTASSIUM: 3.4 mmol/L — AB (ref 3.5–5.1)
Sodium: 126 mmol/L — ABNORMAL LOW (ref 135–145)
TOTAL PROTEIN: 5.7 g/dL — AB (ref 6.5–8.1)
Total Bilirubin: 0.5 mg/dL (ref 0.3–1.2)

## 2017-08-26 LAB — CBC WITH DIFFERENTIAL/PLATELET
BASOS ABS: 0 10*3/uL (ref 0.0–0.1)
Basophils Relative: 0 %
Eosinophils Absolute: 0 10*3/uL (ref 0.0–0.7)
Eosinophils Relative: 0 %
HEMATOCRIT: 23.4 % — AB (ref 36.0–46.0)
Hemoglobin: 7.7 g/dL — ABNORMAL LOW (ref 12.0–15.0)
Lymphocytes Relative: 10 %
Lymphs Abs: 0.2 10*3/uL — ABNORMAL LOW (ref 0.7–4.0)
MCH: 26.6 pg (ref 26.0–34.0)
MCHC: 32.9 g/dL (ref 30.0–36.0)
MCV: 80.7 fL (ref 78.0–100.0)
MONO ABS: 0.3 10*3/uL (ref 0.1–1.0)
Monocytes Relative: 12 %
NEUTROS ABS: 1.8 10*3/uL (ref 1.7–7.7)
Neutrophils Relative %: 78 %
Platelets: 280 10*3/uL (ref 150–400)
RBC: 2.9 MIL/uL — AB (ref 3.87–5.11)
RDW: 16.9 % — AB (ref 11.5–15.5)
WBC: 2.2 10*3/uL — AB (ref 4.0–10.5)

## 2017-08-26 LAB — GLUCOSE, PLEURAL OR PERITONEAL FLUID: GLUCOSE FL: 56 mg/dL

## 2017-08-26 LAB — TYPE AND SCREEN
ABO/RH(D): B POS
ANTIBODY SCREEN: NEGATIVE

## 2017-08-26 LAB — LIPASE, BLOOD: Lipase: 29 U/L (ref 11–51)

## 2017-08-26 LAB — PROTEIN, PLEURAL OR PERITONEAL FLUID: Total protein, fluid: <3 g/dL

## 2017-08-26 LAB — BODY FLUID CELL COUNT WITH DIFFERENTIAL
Lymphs, Fluid: 61 %
Monocyte-Macrophage-Serous Fluid: 25 % — ABNORMAL LOW (ref 50–90)
Neutrophil Count, Fluid: 14 % (ref 0–25)
WBC FLUID: 341 uL (ref 0–1000)

## 2017-08-26 LAB — I-STAT BETA HCG BLOOD, ED (MC, WL, AP ONLY): I-stat hCG, quantitative: <5 m[IU]/mL (ref <5)

## 2017-08-26 LAB — I-STAT CG4 LACTIC ACID, ED: Lactic Acid, Venous: 0.91 mmol/L (ref 0.5–1.9)

## 2017-08-26 MED ORDER — MORPHINE SULFATE 15 MG PO TABS
15.0000 mg | ORAL_TABLET | ORAL | Status: DC | PRN
  Administered 2017-08-26 – 2017-09-01 (×6): 15 mg via ORAL
  Filled 2017-08-26 (×7): qty 1

## 2017-08-26 MED ORDER — VANCOMYCIN HCL IN DEXTROSE 1-5 GM/200ML-% IV SOLN: 1000.0000 mg | Freq: Once | INTRAVENOUS | Status: DC

## 2017-08-26 MED ORDER — SODIUM CHLORIDE 0.9 % IV SOLN: 250.0000 mL | INTRAVENOUS | Status: DC | PRN

## 2017-08-26 MED ORDER — SODIUM CHLORIDE 0.9 % IV SOLN
1.0000 g | Freq: Once | INTRAVENOUS | Status: AC
  Administered 2017-08-26: 1 g via INTRAVENOUS
  Filled 2017-08-26: qty 10

## 2017-08-26 MED ORDER — POTASSIUM CHLORIDE ER 10 MEQ PO TBCR
10.0000 meq | EXTENDED_RELEASE_TABLET | Freq: Every day | ORAL | Status: DC
  Administered 2017-08-26 – 2017-08-31 (×5): 10 meq via ORAL
  Filled 2017-08-26 (×12): qty 1

## 2017-08-26 MED ORDER — HALOPERIDOL 0.5 MG PO TABS: 0.5000 mg | ORAL_TABLET | Freq: Two times a day (BID) | ORAL | Status: DC | PRN

## 2017-08-26 MED ORDER — SODIUM CHLORIDE 0.9% FLUSH
3.0000 mL | Freq: Two times a day (BID) | INTRAVENOUS | Status: DC
  Administered 2017-08-26 – 2017-09-09 (×19): 3 mL via INTRAVENOUS

## 2017-08-26 MED ORDER — HYDROMORPHONE HCL 2 MG/ML IJ SOLN
2.0000 mg | Freq: Once | INTRAMUSCULAR | Status: AC
  Administered 2017-08-26: 2 mg via INTRAVENOUS
  Filled 2017-08-26: qty 1

## 2017-08-26 MED ORDER — APIXABAN 5 MG PO TABS
5.0000 mg | ORAL_TABLET | Freq: Two times a day (BID) | ORAL | Status: DC
  Administered 2017-08-26 – 2017-09-09 (×28): 5 mg via ORAL
  Filled 2017-08-26 (×28): qty 1

## 2017-08-26 MED ORDER — HYDROMORPHONE HCL 1 MG/ML IJ SOLN
1.0000 mg | INTRAMUSCULAR | Status: DC | PRN
  Administered 2017-08-26 – 2017-09-04 (×69): 1 mg via INTRAVENOUS
  Filled 2017-08-26 (×70): qty 1

## 2017-08-26 MED ORDER — ONDANSETRON HCL 4 MG/2ML IJ SOLN
4.0000 mg | Freq: Four times a day (QID) | INTRAMUSCULAR | Status: DC | PRN
  Administered 2017-08-26 – 2017-09-09 (×17): 4 mg via INTRAVENOUS
  Filled 2017-08-26 (×18): qty 2

## 2017-08-26 MED ORDER — FENTANYL 75 MCG/HR TD PT72
150.0000 ug | MEDICATED_PATCH | TRANSDERMAL | Status: DC
  Administered 2017-08-26 – 2017-09-07 (×5): 150 ug via TRANSDERMAL
  Filled 2017-08-26 (×5): qty 2

## 2017-08-26 MED ORDER — PROMETHAZINE HCL 25 MG PO TABS
12.5000 mg | ORAL_TABLET | Freq: Four times a day (QID) | ORAL | Status: DC | PRN
  Administered 2017-08-27: 12.5 mg via ORAL
  Filled 2017-08-26: qty 1

## 2017-08-26 MED ORDER — SODIUM CHLORIDE 0.9 % IV BOLUS (SEPSIS)
1000.0000 mL | Freq: Once | INTRAVENOUS | Status: AC
  Administered 2017-08-26: 1000 mL via INTRAVENOUS

## 2017-08-26 MED ORDER — SODIUM CHLORIDE 0.9 % IV SOLN
2.0000 g | INTRAVENOUS | Status: DC
  Administered 2017-08-27: 2 g via INTRAVENOUS
  Filled 2017-08-26: qty 2
  Filled 2017-08-26: qty 20

## 2017-08-26 MED ORDER — METOPROLOL TARTRATE 25 MG PO TABS
12.5000 mg | ORAL_TABLET | Freq: Two times a day (BID) | ORAL | Status: DC
  Administered 2017-08-26 – 2017-09-05 (×20): 12.5 mg via ORAL
  Filled 2017-08-26 (×21): qty 1

## 2017-08-26 MED ORDER — SODIUM CHLORIDE 0.9% FLUSH: 3.0000 mL | INTRAVENOUS | Status: DC | PRN

## 2017-08-26 MED ORDER — PIPERACILLIN-TAZOBACTAM 3.375 G IVPB 30 MIN: 3.3750 g | Freq: Once | INTRAVENOUS | Status: DC

## 2017-08-26 MED ORDER — ONDANSETRON HCL 4 MG PO TABS: 4.0000 mg | ORAL_TABLET | Freq: Four times a day (QID) | ORAL | Status: DC | PRN

## 2017-08-26 MED ORDER — DOXYCYCLINE HYCLATE 100 MG PO TABS
100.0000 mg | ORAL_TABLET | Freq: Two times a day (BID) | ORAL | Status: DC
  Administered 2017-08-26 – 2017-09-09 (×27): 100 mg via ORAL
  Filled 2017-08-26 (×28): qty 1

## 2017-08-26 MED ORDER — HYDROMORPHONE HCL 1 MG/ML IJ SOLN
0.5000 mg | Freq: Once | INTRAMUSCULAR | Status: DC
  Administered 2017-08-26: 0.5 mg via INTRAVENOUS
  Filled 2017-08-26: qty 1

## 2017-08-26 MED ORDER — LIDOCAINE HCL 1 % IJ SOLN
INTRAMUSCULAR | Status: AC
  Filled 2017-08-26: qty 10

## 2017-08-26 MED ORDER — ONDANSETRON HCL 4 MG/2ML IJ SOLN
4.0000 mg | Freq: Once | INTRAMUSCULAR | Status: AC
  Administered 2017-08-26: 4 mg via INTRAVENOUS
  Filled 2017-08-26: qty 2

## 2017-08-26 NOTE — ED Provider Notes (Signed)
[facility] DEPT Provider Note   CSN: [id number] Arrival date & time: 08/26/17  0734     History   Chief Complaint Chief Complaint  Patient presents with  . Bloated    HPI Amber Silva is a 27 y.o. 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  She reports that she is having abdominal pain.  She was discharged from the hospital on 2/19 after being admitted for similar symptoms.  She reports that this feels like her normal abdominal pain when she gets fluid accumulation but worse pain.  Decreased ostomy output for the past 2 days.  She has received chemo FOLFIRI/Panitumumab that was started on 2/14.  She denies fevers at home.  Reports that she has not been nauseous or vomited.  She reports compliance with all her meds.     HPI  Past Medical History:  Diagnosis Date  . Ascites   . Bronchitis   . Colon cancer ([place])   . Pregnancy induced hypertension   . S/P colostomy [facility])     Patient Active Problem List   Diagnosis Date Noted  . Rectal cancer ([place]) 08/18/2017  . Malnutrition of moderate degree 08/15/2017  . Malignant ascites 08/12/2017  . Abdominal distention 08/12/2017  . Hyponatremia 08/12/2017    Past Surgical History:  Procedure Laterality Date  . COLON SURGERY      OB History    No data available       Home Medications    Prior to Admission medications   Medication Sig Start Date End Date Taking? Authorizing Provider  doxycycline (VIBRA-TABS) 100 MG tablet Take 1 tablet (100 mg total) by mouth every 12 (twelve) hours. (follow up with Dr. Benay Spice to determine duration) 08/23/17 09/03/2017 Yes Elodia Florence., MD  ELIQUIS 5 MG TABS tablet Take 5 mg by mouth 2 (two) times daily. 08/06/17  Yes [provider]  fentaNYL  (DURAGESIC - DOSED MCG/HR) 75 MCG/HR Place 2 patches (150 mcg total) onto the skin every 3 (three) days for 9 days. 08/25/17 09/03/17 Yes Elodia Florence., MD  haloperidol (HALDOL) 0.5 MG tablet Take 0.5 mg by mouth 2 (two) times daily as needed. 07/20/17  Yes [provider]  metoprolol tartrate (LOPRESSOR) 25 MG tablet Take 0.5 tablets (12.5 mg total) by mouth 2 (two) times daily. 08/23/17 09/07/2017 Yes Elodia Florence., MD  morphine (MSIR) 15 MG tablet Take 15 mg by mouth every 2 (two) hours as needed. 08/06/17  Yes [provider]  potassium chloride (K-DUR) 10 MEQ tablet Take 10 mEq by mouth daily.   Yes [provider]  promethazine (PHENERGAN) 12.5 MG tablet Take 1 tablet (12.5 mg total) by mouth every 6 (six) hours as needed for nausea or vomiting. 08/23/17  Yes Elodia Florence., MD  lactulose (CHRONULAC) 10 GM/15ML solution Take 30 mLs (20 g total) by mouth 2 (two) times daily as needed for mild constipation. Patient not taking: Reported on 08/26/2017 08/23/17   Elodia Florence., MD  polyethylene glycol [facility] / Floria Raveling) packet Take 17 g by mouth 2 (two) times daily. Patient not taking: Reported on 08/26/2017 08/23/17   Elodia Florence., MD  senna-docusate (SENOKOT-S) 8.6-50 MG tablet Take 2 tablets by mouth 2 (two) times daily. Patient not taking: Reported on  08/26/2017 08/23/17 09/26/2017  Elodia Florence., MD    Family History No family history on file.  Social History Social History   Tobacco Use  . Smoking status: Never Smoker  . Smokeless tobacco: Never Used  Substance Use Topics  . Alcohol use: No  . Drug use: No     Allergies   Patient has no known allergies.   Review of Systems Review of Systems  Constitutional: Positive for fatigue. Negative for fever.  Respiratory: Positive for shortness of breath. Negative for choking.   Cardiovascular: Negative for chest pain, palpitations and leg swelling.  Gastrointestinal:  Positive for abdominal distention and abdominal pain. Negative for diarrhea, nausea and vomiting.  Genitourinary: Negative for difficulty urinating.  Musculoskeletal: Negative for neck pain and neck stiffness.  Neurological: Negative for weakness and headaches.  All other systems reviewed and are negative.    Physical Exam Updated Vital Signs BP 116/83   Pulse (!) 118   Temp 97.8 F (36.6 C) (Oral)   Resp (!) 27   SpO2 100%   Physical Exam  Constitutional: She is oriented to person, place, and time.  Cachectic, chronically ill-appearing  HENT:  Head: Normocephalic and atraumatic.  Eyes: Conjunctivae are normal. Right eye exhibits no discharge. Left eye exhibits no discharge. No scleral icterus.  Neck: Normal range of motion.  Cardiovascular: Normal rate and regular rhythm.  Pulmonary/Chest:  Tachypneic, mild use of accessory muscles.  Lungs clear to auscultation bilaterally.  Abdominal: Soft. She exhibits distension.  Abdomen is significantly distended, bowel sounds are decreased in all 4 quadrants.  There is diffuse tenderness to palpation, patient pushes examiners hand away when attempting palpation.   Musculoskeletal: She exhibits no edema or deformity.  Neurological: She is alert and oriented to person, place, and time. She exhibits normal muscle tone.  Skin: Skin is warm and dry.  Psychiatric: She has a normal mood and affect. Her behavior is normal.  Nursing note and vitals reviewed.    ED Treatments / Results  Labs (all labs ordered are listed, but only abnormal results are displayed) Labs Reviewed  COMPREHENSIVE METABOLIC PANEL - Abnormal; Notable for the following components:      Result Value   Sodium 126 (*)    Potassium 3.4 (*)    Chloride 91 (*)    CO2 21 (*)    Calcium 8.2 (*)    Total Protein 5.7 (*)    Albumin 2.1 (*)    Alkaline Phosphatase 156 (*)    All other components within normal limits  CBC WITH DIFFERENTIAL/PLATELET - Abnormal; Notable for  the following components:   WBC 2.2 (*)    RBC 2.90 (*)    Hemoglobin 7.7 (*)    HCT 23.4 (*)    RDW 16.9 (*)    Lymphs Abs 0.2 (*)    All other components within normal limits  URINALYSIS, ROUTINE W REFLEX MICROSCOPIC - Abnormal; Notable for the following components:   Hgb urine dipstick MODERATE (*)    Ketones, ur 5 (*)    Leukocytes, UA MODERATE (*)    Bacteria, UA RARE (*)    All other components within normal limits  BODY FLUID CELL COUNT WITH DIFFERENTIAL - Abnormal; Notable for the following components:   Color, Fluid RED (*)    Appearance, Fluid CLOUDY (*)    Monocyte-Macrophage-Serous Fluid 25 (*)    All other components within normal limits  CULTURE, BLOOD (ROUTINE X 2)  CULTURE, BLOOD (ROUTINE X 2)  BODY  FLUID CULTURE  LIPASE, BLOOD  PROTEIN, PLEURAL OR PERITONEAL FLUID  GLUCOSE, PLEURAL OR PERITONEAL FLUID  PATHOLOGIST SMEAR REVIEW  I-STAT CG4 LACTIC ACID, ED  I-STAT BETA HCG BLOOD, ED ([facility], [facility], [facility] ONLY)  TYPE AND SCREEN    EKG  EKG Interpretation None       Radiology Dg Chest 2 View  Result Date: 08/26/2017 CLINICAL DATA:  Shortness of breath. EXAM: CHEST  2 VIEW COMPARISON:  Radiographs of August 12, 2017. FINDINGS: The heart size and mediastinal contours are within normal limits. Right internal jugular Port-A-Cath is unchanged in position. No pneumothorax or pleural effusion is noted. Left lung is clear. Mild subsegmental atelectasis is noted in right midlung. The visualized skeletal structures are unremarkable. IMPRESSION: Mild subsegmental atelectasis seen in right midlung. Electronically Signed   By: Marijo Conception, M.D.   On: 08/26/2017 08:37   US Paracentesis  Result Date: 08/26/2017 INDICATION: History of metastatic rectal carcinoma, recurrent malignant ascites. Request made for diagnostic and therapeutic paracentesis. EXAM: ULTRASOUND GUIDED DIAGNOSTIC AND THERAPEUTIC PARACENTESIS MEDICATIONS: 1% LIDOCAINE LOCAL COMPLICATIONS: None immediate.  PROCEDURE: Informed written consent was obtained from the patient after a discussion of the risks, benefits and alternatives to treatment. A timeout was performed prior to the initiation of the procedure. Initial ultrasound scanning demonstrates a moderate amount of ascites within the left mid to lower abdominal quadrant. The left mid to lower abdomen was prepped and draped in the usual sterile fashion. 1% lidocaine was used for local anesthesia. Following this, a 6 Fr Safe-T-Centesis catheter was introduced. An ultrasound image was saved for documentation purposes. The paracentesis was performed. The catheter was removed and a dressing was applied. The patient tolerated the procedure well without immediate post procedural complication. FINDINGS: A total of approximately 3.8 liters of bloody fluid was removed. Samples were sent to the laboratory as requested by the clinical team. IMPRESSION: Successful ultrasound-guided diagnostic and therapeutic paracentesis yielding 3.8 liters liters of peritoneal fluid. Read by: Rowe Robert, PA-C Electronically Signed   By: Jerilynn Mages.  Shick M.D.   On: 08/26/2017 12:52    Procedures Procedures (including critical care time)  Medications Ordered in ED Medications  lidocaine (XYLOCAINE) 1 % (with pres) injection (not administered)  cefTRIAXone (ROCEPHIN) 1 g in sodium chloride 0.9 % 100 mL IVPB (not administered)  sodium chloride 0.9 % bolus 1,000 mL (0 mLs Intravenous Stopped 08/26/17 1036)  HYDROmorphone (DILAUDID) injection 2 mg (2 mg Intravenous Given 08/26/17 1033)  HYDROmorphone (DILAUDID) injection 2 mg (2 mg Intravenous Given 08/26/17 1315)  ondansetron (ZOFRAN) injection 4 mg (4 mg Intravenous Given 08/26/17 1315)  HYDROmorphone (DILAUDID) injection 2 mg (2 mg Intravenous Given 08/26/17 1503)     Initial Impression / Assessment and Plan / ED Course  I have reviewed the triage vital signs and the nursing notes.  Pertinent labs & imaging results that were available  during my care of the patient were reviewed by me and considered in my medical decision making (see chart for details).  Clinical Course as of Aug 26 1628  Fri Aug 26, 2017  1302 Patient reevaluated, she is back from ultrasound where she had a therapeutic paracentesis.  Her abdomen is significantly deflated when compared with previous exam.  She continues to have diffuse abdominal pain and tenderness to palpation.  Will order additional dose of Dilaudid while awaiting fluid studies.  [EH]  1504 She reevaluated, she is asking if there is something stronger than Dilaudid that she can be given.  She reports  that her pain is more than usual after she has a paracentesis.  [EH]  1550 Spoke with hospitalist who will admit patient.    [EH]    Clinical Course User Index [EH] Lorin Glass, PA-C   Freeman Caldron has a history of stage IV rectal cancer with metastasis to liver who presents today for evaluation of abdominal pain.  She reports feeling significantly bloated.  On exam she had significant abdominal distention, diffuse abdominal pain and tenderness.  Initially I called a code sepsis, however that was discontinued due to suspicion that the ascites is playing a significant role in her tachycardia and tachypnea.  Labs were obtained and reviewed, appear grossly consistent with patient's baseline.  PE was considered, as patient is tachycardic, however her primary complaint today was the abdominal pain.  As she is anticoagulated she was sent for a IR guided paracentesis and samples were obtained.  3 L of fluid were removed, after which patient's abdomen was significantly less distended.  She reported a decrease in pain initially, however then reports that her pain was worse and does not feel like her usual post paracentesis pains.  Her pain was treated in the ED with multiple doses of dilaudid with out sufficient pain control.  UA consistent with UTI.  Patient is neutropenic, suspect secondary to chemo  with white count of 2.2.  Remainder of labs reviewed, patient is hyponatremic at 126, hypochloremic at 91, anemic at 7.7 (consistient with her baseline).  Based on concern for SBP as a cause of her tachycardia and pain she was empirically treated with rocephin which would also cover her for UTI.  Hospitalist was consulted for admission for continued pain control and antibiotics who agreed to admit patient.      Final Clinical Impressions(s) / ED Diagnoses   Final diagnoses:  Ascites  Chemotherapy-induced neutropenia [facility])  Hyponatremia    ED Discharge Orders    None       Ollen Gross 08/26/17 Stephens Shire, MD 08/27/17 [phone]

## 2017-08-26 NOTE — ED Notes (Addendum)
Pt. Unable to urinate at this time. Will collect urine specimen will pt. Voids. Nurse aware.

## 2017-08-26 NOTE — ED Triage Notes (Signed)
Pt verbalizes abdominal swelling causing SOB onset this morning; hx of same with "needing it drained." Pt has colon cancer.

## 2017-08-26 NOTE — Procedures (Signed)
Ultrasound-guided diagnostic and therapeutic paracentesis performed yielding 3.8 liters of bloody fluid. No immediate complications. A portion of the fluid was sent to the lab for preordered studies.

## 2017-08-26 NOTE — ED Notes (Signed)
Patient transported to Ultrasound 

## 2017-08-26 NOTE — ED Notes (Signed)
Code sepsis cancelled by Dr Regenia Skeeter.

## 2017-08-26 NOTE — H&P (Signed)
History and Physical    Amber Silva ZTI:[id number] DOB: October 01, 1990 DOA: 08/26/2017  PCP: [name]., MD  Patient coming from: Home  Chief Complaint: Abdominal pain and swelling  HPI: Amber Silva is a 27 y.o. female with medical history significant of stage IV rectal cancer with metastatic disease recurring ascites was just discharged on February 19 after hospitalization for malignant ascites and possible spontaneous bacterial peritonitis.  She had several paracentesis during that hospitalization.  Patient comes in because she was swollen again with uncontrolled pain at home.  She was started on a Duragesic patch but she is not been able to get that filled from [facility] yet.  So her pain is not been very well controlled as far as her abdominal pain.  Today she got almost 4 L of fluid tapped off of her abdomen again.  She denies any fevers.  She is on doxycycline for possibility of spontaneous bacterial peritonitis from her last hospitalization however culture data from that hospitalization have all been negative so far.  She is feeling better since she got tapped today however her pain is still not well controlled after 6 mg of Dilaudid IV given in the ED.  Patient is referred for admission for uncontrolled pain.   Review of Systems: As per HPI otherwise 10 point review of systems negative.   Past Medical History:  Diagnosis Date  . Ascites   . Bronchitis   . Colon cancer ([place])   . Pregnancy induced hypertension   . S/P colostomy [facility])     Past Surgical History:  Procedure Laterality Date  . COLON SURGERY       reports that  has never smoked. she has never used smokeless tobacco. She reports that she does not drink alcohol or use drugs.  No Known Allergies  No family history on file.  No premature coronary artery disease  Prior to Admission medications   Medication Sig Start Date End Date Taking? Authorizing Provider  doxycycline (VIBRA-TABS) 100 MG tablet Take 1 tablet  (100 mg total) by mouth every 12 (twelve) hours. (follow up with Dr. Benay Spice to determine duration) 08/23/17 09/02/2017 Yes Elodia Florence., MD  ELIQUIS 5 MG TABS tablet Take 5 mg by mouth 2 (two) times daily. 08/06/17  Yes [provider]  fentaNYL (DURAGESIC - DOSED MCG/HR) 75 MCG/HR Place 2 patches (150 mcg total) onto the skin every 3 (three) days for 9 days. 08/25/17 09/03/17 Yes Elodia Florence., MD  haloperidol (HALDOL) 0.5 MG tablet Take 0.5 mg by mouth 2 (two) times daily as needed. 07/20/17  Yes [provider]  metoprolol tartrate (LOPRESSOR) 25 MG tablet Take 0.5 tablets (12.5 mg total) by mouth 2 (two) times daily. 08/23/17 09/20/2017 Yes Elodia Florence., MD  morphine (MSIR) 15 MG tablet Take 15 mg by mouth every 2 (two) hours as needed. 08/06/17  Yes [provider]  potassium chloride (K-DUR) 10 MEQ tablet Take 10 mEq by mouth daily.   Yes [provider]  promethazine (PHENERGAN) 12.5 MG tablet Take 1 tablet (12.5 mg total) by mouth every 6 (six) hours as needed for nausea or vomiting. 08/23/17  Yes Elodia Florence., MD  lactulose (CHRONULAC) 10 GM/15ML solution Take 30 mLs (20 g total) by mouth 2 (two) times daily as needed for mild constipation. Patient not taking: Reported on 08/26/2017 08/23/17   Elodia Florence., MD  polyethylene glycol [facility] / Floria Raveling) packet Take 17 g by mouth 2 (two)  times daily. Patient not taking: Reported on 08/26/2017 08/23/17   Elodia Florence., MD  senna-docusate (SENOKOT-S) 8.6-50 MG tablet Take 2 tablets by mouth 2 (two) times daily. Patient not taking: Reported on 08/26/2017 08/23/17 09/24/2017  Elodia Florence., MD    Physical Exam: Vitals:   08/26/17 1300 08/26/17 1330 08/26/17 1500 08/26/17 1530  BP: 113/76 104/71 116/83 112/82  Pulse: (!) 116 (!) 116 (!) 118 (!) 114  Resp: (!) 22 19 (!) 27 18  Temp:      TempSrc:      SpO2: 100% 100% 100% 100%      Constitutional: NAD, calm,  comfortable very cachectic and malnourished appearing Vitals:   08/26/17 1300 08/26/17 1330 08/26/17 1500 08/26/17 1530  BP: 113/76 104/71 116/83 112/82  Pulse: (!) 116 (!) 116 (!) 118 (!) 114  Resp: (!) 22 19 (!) 27 18  Temp:      TempSrc:      SpO2: 100% 100% 100% 100%   Eyes: PERRL, lids and conjunctivae normal ENMT: Mucous membranes are moist. Posterior pharynx clear of any exudate or lesions.Normal dentition.  Neck: normal, supple, no masses, no thyromegaly Respiratory: clear to auscultation bilaterally, no wheezing, no crackles. Normal respiratory effort. No accessory muscle use.  Cardiovascular: Regular rate and rhythm, no murmurs / rubs / gallops. No extremity edema. 2+ pedal pulses. No carotid bruits.  Abdomen: Diffuse tenderness, several masses palpated. No hepatosplenomegaly. Bowel sounds positive.  Musculoskeletal: no clubbing / cyanosis. No joint deformity upper and lower extremities. Good ROM, no contractures. Normal muscle tone.  Skin: no rashes, lesions, ulcers. No induration Neurologic: CN 2-12 grossly intact. Sensation intact, DTR normal. Strength 5/5 in all 4.  Psychiatric: Normal judgment and insight. Alert and oriented x 3. Normal mood.    Labs on Admission: I have personally reviewed following labs and imaging studies  CBC: Recent Labs  Lab 08/20/17 0355 08/21/17 1129 08/22/17 0500 08/23/17 0557 08/23/17 1432 08/26/17 0914  WBC 6.4 5.4 6.4 5.7  --  2.2*  NEUTROABS  --   --   --   --   --  1.8  HGB 8.1* 8.3* 8.2* 7.1* 7.7* 7.7*  HCT 24.7* 26.3* 25.9* 21.6* 23.9* 23.4*  MCV 81.0 81.7 82.2 81.5  --  80.7  PLT 259 212 213 184  --  423   Basic Metabolic Panel: Recent Labs  Lab 08/20/17 0355 08/21/17 1129 08/22/17 0500 08/23/17 0557 08/26/17 0914  NA 128* 129* 129* 130* 126*  K 4.6 4.0 4.2 4.0 3.4*  CL 96* 98* 96* 97* 91*  CO2 25 25 24 25  21*  GLUCOSE 97 123* 92 100* 96  BUN 16 10 7 6 6   CREATININE 0.59 0.48 0.34* 0.40* 0.48  CALCIUM 8.3* 8.1*  8.2* 8.1* 8.2*  MG 2.0 1.9  --  1.8  --    GFR: Estimated Creatinine Clearance: 87.4 mL/min (by C-G formula based on SCr of 0.48 mg/dL). Liver Function Tests: Recent Labs  Lab 08/20/17 0355 08/21/17 1129 08/22/17 0500 08/23/17 0557 08/26/17 0914  AST 59* 208* 73* 35 23  ALT 18 106* 66* 38 18  ALKPHOS 124 216* 231* 191* 156*  BILITOT 0.7 0.6 0.9 0.6 0.5  PROT 5.5* 5.4* 5.4* 5.2* 5.7*  ALBUMIN 1.8* 1.7* 1.6* 2.0* 2.1*   Recent Labs  Lab 08/26/17 0914  LIPASE 29   No results for input(s): AMMONIA in the last 168 hours. Coagulation Profile: No results for input(s): INR, PROTIME in the last 168  hours. Cardiac Enzymes: No results for input(s): CKTOTAL, CKMB, CKMBINDEX, TROPONINI in the last 168 hours. BNP (last 3 results) No results for input(s): PROBNP in the last 8760 hours. HbA1C: No results for input(s): HGBA1C in the last 72 hours. CBG: No results for input(s): GLUCAP in the last 168 hours. Lipid Profile: No results for input(s): CHOL, HDL, LDLCALC, TRIG, CHOLHDL, LDLDIRECT in the last 72 hours. Thyroid Function Tests: No results for input(s): TSH, T4TOTAL, FREET4, T3FREE, THYROIDAB in the last 72 hours. Anemia Panel: No results for input(s): VITAMINB12, FOLATE, FERRITIN, TIBC, IRON, RETICCTPCT in the last 72 hours. Urine analysis:    Component Value Date/Time   COLORURINE YELLOW 08/26/2017 1502   APPEARANCEUR CLEAR 08/26/2017 1502   LABSPEC 1.013 08/26/2017 1502   PHURINE 7.0 08/26/2017 1502   GLUCOSEU NEGATIVE 08/26/2017 1502   HGBUR MODERATE (A) 08/26/2017 1502   BILIRUBINUR NEGATIVE 08/26/2017 1502   KETONESUR 5 (A) 08/26/2017 1502   PROTEINUR NEGATIVE 08/26/2017 1502   UROBILINOGEN >8.0 (H) 02/11/2014 1125   NITRITE NEGATIVE 08/26/2017 1502   LEUKOCYTESUR MODERATE (A) 08/26/2017 1502   Sepsis Labs: !!!!!!!!!!!!!!!!!!!!!!!!!!!!!!!!!!!!!!!!!!!! @LABRCNTIP (procalcitonin:4,lacticidven:4) ) Recent Results (from the past 240 hour(s))  Culture, body  fluid-bottle     Status: None   Collection Time: 08/18/17 11:46 AM  Result Value Ref Range Status   Specimen Description PERITONEAL  Final   Special Requests NONE  Final   Culture   Final    NO GROWTH 5 DAYS Performed at [facility] [address], [place], [place] [address]    Report Status 08/23/2017 FINAL  Final  Gram stain     Status: None   Collection Time: 08/18/17 11:46 AM  Result Value Ref Range Status   Specimen Description PERITONEAL  Final   Special Requests NONE  Final   Gram Stain   Final    MODERATE WBC PRESENT,BOTH PMN AND MONONUCLEAR NO ORGANISMS SEEN Performed at [facility], [place] [address], [place], [place] [address]    Report Status 08/18/2017 FINAL  Final     Radiological Exams on Admission: Dg Chest 2 View  Result Date: 08/26/2017 CLINICAL DATA:  Shortness of breath. EXAM: CHEST  2 VIEW COMPARISON:  Radiographs of August 12, 2017. FINDINGS: The heart size and mediastinal contours are within normal limits. Right internal jugular Port-A-Cath is unchanged in position. No pneumothorax or pleural effusion is noted. Left lung is clear. Mild subsegmental atelectasis is noted in right midlung. The visualized skeletal structures are unremarkable. IMPRESSION: Mild subsegmental atelectasis seen in right midlung. Electronically Signed   By: Marijo Conception, M.D.   On: 08/26/2017 08:37   US Paracentesis  Result Date: 08/26/2017 INDICATION: History of metastatic rectal carcinoma, recurrent malignant ascites. Request made for diagnostic and therapeutic paracentesis. EXAM: ULTRASOUND GUIDED DIAGNOSTIC AND THERAPEUTIC PARACENTESIS MEDICATIONS: 1% LIDOCAINE LOCAL COMPLICATIONS: None immediate. PROCEDURE: Informed written consent was obtained from the patient after a discussion of the risks, benefits and alternatives to treatment. A timeout was performed prior to the initiation of the procedure. Initial ultrasound scanning demonstrates a moderate amount of ascites  within the left mid to lower abdominal quadrant. The left mid to lower abdomen was prepped and draped in the usual sterile fashion. 1% lidocaine was used for local anesthesia. Following this, a 6 Fr Safe-T-Centesis catheter was introduced. An ultrasound image was saved for documentation purposes. The paracentesis was performed. The catheter was removed and a dressing was applied. The patient tolerated the procedure well without immediate post  procedural complication. FINDINGS: A total of approximately 3.8 liters of bloody fluid was removed. Samples were sent to the laboratory as requested by the clinical team. IMPRESSION: Successful ultrasound-guided diagnostic and therapeutic paracentesis yielding 3.8 liters liters of peritoneal fluid. Read by: Rowe Robert, PA-C Electronically Signed   By: Jerilynn Mages.  Shick M.D.   On: 08/26/2017 12:52    Old chart reviewed Case discussed with EDP  Assessment/Plan 27 year old female unfortunately with advanced rectal cancer comes in with abdominal ascites and uncontrolled pain Principal Problem:   Abdominal pain acute on chronic pain.  Patient is waiting for [facility] to get her Duragesic patches in.  Her family is checking today to see if she is got these in.  Will observe her overnight for uncontrolled pain.  Will cover with Rocephin as her fluid that was tapped today appeared very cloudy and somewhat bloody.  Gram stain is also pending from that tap.  Follow-up on culture data and information from the Gram stain empirically cover with Rocephin at this time.  Placed Duragesic patch.  Continue her intermediate release morphine 50 mg every 2 hours as needed for moderate pain.  We will also provide her with IV Dilaudid for severe pain breakthrough.  Active Problems:   Malnutrition of moderate degree-noted   Rectal cancer (HCC)-continue outpatient follow-up with her oncologist     DVT prophylaxis: Eliquis Code Status: Full Family Communication: None Disposition Plan: Per  day team Consults called: None Admission status: Observation   Marvine Encalade A MD [facility]  If 7PM-7AM, please contact night-coverage [email or web address] Password [facility]  08/26/2017, 4:33 PM

## 2017-08-27 ENCOUNTER — Observation Stay (HOSPITAL_COMMUNITY): Payer: Medicaid Other

## 2017-08-27 DIAGNOSIS — E440 Moderate protein-calorie malnutrition: Secondary | ICD-10-CM | POA: Diagnosis not present

## 2017-08-27 DIAGNOSIS — I959 Hypotension, unspecified: Secondary | ICD-10-CM | POA: Diagnosis present

## 2017-08-27 DIAGNOSIS — E871 Hypo-osmolality and hyponatremia: Secondary | ICD-10-CM | POA: Diagnosis present

## 2017-08-27 DIAGNOSIS — E43 Unspecified severe protein-calorie malnutrition: Secondary | ICD-10-CM | POA: Diagnosis present

## 2017-08-27 DIAGNOSIS — R180 Malignant ascites: Secondary | ICD-10-CM

## 2017-08-27 DIAGNOSIS — Z6823 Body mass index (BMI) 23.0-23.9, adult: Secondary | ICD-10-CM | POA: Diagnosis not present

## 2017-08-27 DIAGNOSIS — E8770 Fluid overload, unspecified: Secondary | ICD-10-CM | POA: Diagnosis present

## 2017-08-27 DIAGNOSIS — R112 Nausea with vomiting, unspecified: Secondary | ICD-10-CM | POA: Diagnosis not present

## 2017-08-27 DIAGNOSIS — R21 Rash and other nonspecific skin eruption: Secondary | ICD-10-CM | POA: Diagnosis present

## 2017-08-27 DIAGNOSIS — R188 Other ascites: Secondary | ICD-10-CM | POA: Diagnosis present

## 2017-08-27 DIAGNOSIS — C20 Malignant neoplasm of rectum: Secondary | ICD-10-CM | POA: Diagnosis present

## 2017-08-27 DIAGNOSIS — L8990 Pressure ulcer of unspecified site, unspecified stage: Secondary | ICD-10-CM | POA: Diagnosis present

## 2017-08-27 DIAGNOSIS — C786 Secondary malignant neoplasm of retroperitoneum and peritoneum: Secondary | ICD-10-CM | POA: Diagnosis present

## 2017-08-27 DIAGNOSIS — I82412 Acute embolism and thrombosis of left femoral vein: Secondary | ICD-10-CM | POA: Diagnosis present

## 2017-08-27 DIAGNOSIS — K652 Spontaneous bacterial peritonitis: Secondary | ICD-10-CM | POA: Diagnosis present

## 2017-08-27 DIAGNOSIS — Z79899 Other long term (current) drug therapy: Secondary | ICD-10-CM | POA: Diagnosis not present

## 2017-08-27 DIAGNOSIS — K5900 Constipation, unspecified: Secondary | ICD-10-CM | POA: Diagnosis present

## 2017-08-27 DIAGNOSIS — Z7189 Other specified counseling: Secondary | ICD-10-CM | POA: Diagnosis not present

## 2017-08-27 DIAGNOSIS — C787 Secondary malignant neoplasm of liver and intrahepatic bile duct: Secondary | ICD-10-CM | POA: Diagnosis present

## 2017-08-27 DIAGNOSIS — Z7901 Long term (current) use of anticoagulants: Secondary | ICD-10-CM | POA: Diagnosis not present

## 2017-08-27 DIAGNOSIS — D649 Anemia, unspecified: Secondary | ICD-10-CM | POA: Diagnosis not present

## 2017-08-27 DIAGNOSIS — Z933 Colostomy status: Secondary | ICD-10-CM | POA: Diagnosis not present

## 2017-08-27 DIAGNOSIS — T451X5A Adverse effect of antineoplastic and immunosuppressive drugs, initial encounter: Secondary | ICD-10-CM | POA: Diagnosis present

## 2017-08-27 DIAGNOSIS — G893 Neoplasm related pain (acute) (chronic): Secondary | ICD-10-CM | POA: Diagnosis present

## 2017-08-27 DIAGNOSIS — D701 Agranulocytosis secondary to cancer chemotherapy: Secondary | ICD-10-CM | POA: Diagnosis present

## 2017-08-27 DIAGNOSIS — Z515 Encounter for palliative care: Secondary | ICD-10-CM | POA: Diagnosis present

## 2017-08-27 DIAGNOSIS — R1010 Upper abdominal pain, unspecified: Secondary | ICD-10-CM

## 2017-08-27 LAB — CBC
HCT: 23.1 % — ABNORMAL LOW (ref 36.0–46.0)
HEMOGLOBIN: 7.5 g/dL — AB (ref 12.0–15.0)
MCH: 26.9 pg (ref 26.0–34.0)
MCHC: 32.5 g/dL (ref 30.0–36.0)
MCV: 82.8 fL (ref 78.0–100.0)
Platelets: 282 10*3/uL (ref 150–400)
RBC: 2.79 MIL/uL — ABNORMAL LOW (ref 3.87–5.11)
RDW: 17.2 % — ABNORMAL HIGH (ref 11.5–15.5)
WBC: 2.8 10*3/uL — AB (ref 4.0–10.5)

## 2017-08-27 LAB — COMPREHENSIVE METABOLIC PANEL
ALBUMIN: 1.8 g/dL — AB (ref 3.5–5.0)
ALK PHOS: 149 U/L — AB (ref 38–126)
ALT: 17 U/L (ref 14–54)
AST: 24 U/L (ref 15–41)
Anion gap: 8 (ref 5–15)
BILIRUBIN TOTAL: 0.6 mg/dL (ref 0.3–1.2)
CALCIUM: 7.8 mg/dL — AB (ref 8.9–10.3)
CO2: 23 mmol/L (ref 22–32)
Chloride: 96 mmol/L — ABNORMAL LOW (ref 101–111)
Creatinine, Ser: 0.48 mg/dL (ref 0.44–1.00)
GFR calc Af Amer: >60 mL/min (ref >60)
GFR calc non Af Amer: >60 mL/min (ref >60)
Glucose, Bld: 97 mg/dL (ref 65–99)
Potassium: 3.7 mmol/L (ref 3.5–5.1)
Sodium: 127 mmol/L — ABNORMAL LOW (ref 135–145)
TOTAL PROTEIN: 5.3 g/dL — AB (ref 6.5–8.1)

## 2017-08-27 MED ORDER — PROMETHAZINE HCL 25 MG/ML IJ SOLN
12.5000 mg | Freq: Four times a day (QID) | INTRAMUSCULAR | Status: DC | PRN
  Administered 2017-08-27 – 2017-09-09 (×35): 12.5 mg via INTRAVENOUS
  Filled 2017-08-27 (×37): qty 1

## 2017-08-27 NOTE — Progress Notes (Signed)
[facility] PROGRESS NOTE  Amber Silva WUJ:[id number] DOB: Feb 08, 1991 DOA: 08/26/2017  PCP: [name]., MD  Brief History/Interval Summary: 27 year old female with a past medical history of stage IV rectal cancer with metastatic disease, ascites recently discharged on February 19 after being managed for malignant ascites and possible spontaneous bacterial peritonitis.  She had several paracentesis during that hospitalization.  Presented with complains of uncontrolled pain at home and abdominal distention.  She was discharged on Duragesic patch however her pharmacy has not been able to obtain it for her.  Patient was seen in the emergency department.  She underwent paracentesis and was hospitalized for pain control.  Reason for Visit: Malignant ascites.  Nausea and vomiting.  Consultants: None  Procedures: Ultrasound-guided paracentesis  Antibiotics: Ceftriaxone  Subjective/Interval History: Patient feels nauseated this morning.  She has had 3 episodes of vomiting.  States that her abdomen feels better.  Continues to have pain.  ROS: Denies any shortness of breath.  Objective:  Vital Signs  Vitals:   08/26/17 1700 08/26/17 1730 08/26/17 2000 08/27/17 0448  BP: 140/78 117/72 (!) 103/56 108/65  Pulse: (!) 121 (!) 123 (!) 116 (!) 107  Resp: (!) 24 (!) 21 (!) 27 (!) 24  Temp:   98.8 F (37.1 C) 98.4 F (36.9 C)  TempSrc:   Oral Oral  SpO2: 100% 100% 100% 100%    Intake/Output Summary (Last 24 hours) at 08/27/2017 1204 Last data filed at 08/27/2017 0930 Gross per 24 hour  Intake 580 ml  Output -  Net 580 ml   There were no vitals filed for this visit.  General appearance: alert, cooperative and no distress Head: Normocephalic, without obvious abnormality, atraumatic Resp: Diminished air entry at the bases.  No definite crackles or wheezing. Cardio: regular rate and rhythm, S1, S2 normal, no murmur, click, rub or gallop GI: Abdomen is distended.  Tender to  palpation diffusely without any rebound or rigidity.  Guarding is present.  Bowel sounds present.  mass appreciated most likely liver. Extremities: extremities normal, atraumatic, no cyanosis or edema Neurologic: No obvious focal neurological deficits.  Lab Results:  Data Reviewed: I have personally reviewed following labs and imaging studies  CBC: Recent Labs  Lab 08/21/17 1129 08/22/17 0500 08/23/17 0557 08/23/17 1432 08/26/17 0914  WBC 5.4 6.4 5.7  --  2.2*  NEUTROABS  --   --   --   --  1.8  HGB 8.3* 8.2* 7.1* 7.7* 7.7*  HCT 26.3* 25.9* 21.6* 23.9* 23.4*  MCV 81.7 82.2 81.5  --  80.7  PLT 212 213 184  --  956    Basic Metabolic Panel: Recent Labs  Lab 08/21/17 1129 08/22/17 0500 08/23/17 0557 08/26/17 0914  NA 129* 129* 130* 126*  K 4.0 4.2 4.0 3.4*  CL 98* 96* 97* 91*  CO2 25 24 25  21*  GLUCOSE 123* 92 100* 96  BUN 10 7 6 6   CREATININE 0.48 0.34* 0.40* 0.48  CALCIUM 8.1* 8.2* 8.1* 8.2*  MG 1.9  --  1.8  --     GFR: Estimated Creatinine Clearance: 87.4 mL/min (by C-G formula based on SCr of 0.48 mg/dL).  Liver Function Tests: Recent Labs  Lab 08/21/17 1129 08/22/17 0500 08/23/17 0557 08/26/17 0914  AST 208* 73* 35 23  ALT 106* 66* 38 18  ALKPHOS 216* 231* 191* 156*  BILITOT 0.6 0.9 0.6 0.5  PROT 5.4* 5.4* 5.2* 5.7*  ALBUMIN 1.7* 1.6* 2.0* 2.1*    Recent Labs  Lab 08/26/17 0914  LIPASE 29   No results for input(s): AMMONIA in the last 168 hours.   Recent Results (from the past 240 hour(s))  Culture, body fluid-bottle     Status: None   Collection Time: 08/18/17 11:46 AM  Result Value Ref Range Status   Specimen Description PERITONEAL  Final   Special Requests NONE  Final   Culture   Final    NO GROWTH 5 DAYS Performed at [facility], [address], [place], [place] [address]    Report Status 08/23/2017 FINAL  Final  Gram stain     Status: None   Collection Time: 08/18/17 11:46 AM  Result Value Ref Range Status   Specimen  Description PERITONEAL  Final   Special Requests NONE  Final   Gram Stain   Final    MODERATE WBC PRESENT,BOTH PMN AND MONONUCLEAR NO ORGANISMS SEEN Performed at [facility], [place] [address], [place], [place] [address]    Report Status 08/18/2017 FINAL  Final  Body fluid culture     Status: None (Preliminary result)   Collection Time: 08/26/17 12:08 PM  Result Value Ref Range Status   Specimen Description   Final    PERITONEAL Performed at [facility] [address], [place], [place] [address]    Special Requests   Final    NONE Performed at [facility], [place] [address], [place], [place] [address]    Gram Stain   Final    RARE WBC PRESENT, PREDOMINANTLY MONONUCLEAR NO ORGANISMS SEEN    Culture   Final    NO GROWTH < 24 HOURS Performed at [facility], [place] [address] [place], [place] [address]    Report Status PENDING  Incomplete      Radiology Studies: Dg Chest 2 View  Result Date: 08/26/2017 CLINICAL DATA:  Shortness of breath. EXAM: CHEST  2 VIEW COMPARISON:  Radiographs of August 12, 2017. FINDINGS: The heart size and mediastinal contours are within normal limits. Right internal jugular Port-A-Cath is unchanged in position. No pneumothorax or pleural effusion is noted. Left lung is clear. Mild subsegmental atelectasis is noted in right midlung. The visualized skeletal structures are unremarkable. IMPRESSION: Mild subsegmental atelectasis seen in right midlung. Electronically Signed   By: Marijo Conception, M.D.   On: 08/26/2017 08:37   US Paracentesis  Result Date: 08/26/2017 INDICATION: History of metastatic rectal carcinoma, recurrent malignant ascites. Request made for diagnostic and therapeutic paracentesis. EXAM: ULTRASOUND GUIDED DIAGNOSTIC AND THERAPEUTIC PARACENTESIS MEDICATIONS: 1% LIDOCAINE LOCAL COMPLICATIONS: None immediate. PROCEDURE: Informed written consent was obtained from the patient after a discussion of  the risks, benefits and alternatives to treatment. A timeout was performed prior to the initiation of the procedure. Initial ultrasound scanning demonstrates a moderate amount of ascites within the left mid to lower abdominal quadrant. The left mid to lower abdomen was prepped and draped in the usual sterile fashion. 1% lidocaine was used for local anesthesia. Following this, a 6 Fr Safe-T-Centesis catheter was introduced. An ultrasound image was saved for documentation purposes. The paracentesis was performed. The catheter was removed and a dressing was applied. The patient tolerated the procedure well without immediate post procedural complication. FINDINGS: A total of approximately 3.8 liters of bloody fluid was removed. Samples were sent to the laboratory as requested by the clinical team. IMPRESSION: Successful ultrasound-guided diagnostic and therapeutic paracentesis yielding 3.8 liters liters of peritoneal fluid. Read by: Rowe Robert, PA-C Electronically  Signed   By: Jerilynn Mages.  Shick M.D.   On: 08/26/2017 12:52     Medications:  Scheduled: . apixaban  5 mg Oral BID  . doxycycline  100 mg Oral Q12H  . fentaNYL  150 mcg Transdermal Q72H  . metoprolol tartrate  12.5 mg Oral BID  . potassium chloride  10 mEq Oral Daily  . sodium chloride flush  3 mL Intravenous Q12H   Continuous: . sodium chloride    . cefTRIAXone (ROCEPHIN)  IV     JEH:UDJSHF chloride, haloperidol, HYDROmorphone (DILAUDID) injection, morphine, ondansetron **OR** ondansetron (ZOFRAN) IV, promethazine, sodium chloride flush  Assessment/Plan:  Principal Problem:   Abdominal pain Active Problems:   Malnutrition of moderate degree   Rectal cancer (HCC)    Nausea and vomiting Etiology unclear.  Patient does have abdominal tenderness however she does have hepatic metastases and ascites and has chronic cancer related pain.  Reason for nausea vomiting is not entirely clear.  She did undergo paracentesis yesterday.  We will get  abdominal films to further evaluate. Symptomatic treatment for now.  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  Underwent paracentesis yesterday.  Fluid analysis reviewed.  Previous cultures have been negative for bacteria.  During last hospitalization there was some concern for spontaneous bacterial peritonitis.  She was discharged on doxycycline.  Currently is on doxycycline and ceftriaxone.  Gram stain is negative.  If cultures remain negative we can probably discontinue the ceftriaxone.  She received chemotherapy during her last hospitalization.  According to patient the next dose is due on the 28th of this month.  She was initially being treated by oncologist in [facility] however is now being treated by Dr. Benay Spice.  Cancer related pain Pain is poorly controlled.  She could not fill her fentanyl patch prescription as her pharmacy did not have this medication.  She is waiting on them.  Continue Duragesic patch for now.  Continue IV Dilaudid.  Recently diagnosed DVT of the left femoral and common femoral veins She is on anticoagulation with Eliquis which is being continued.  Hyponatremia Probably due to liver metastases and volume overload.  She has had chronic hyponatremia.  We will repeat labs today.  Normocytic anemia Hemoglobin is stable.  No evidence of overt bleeding.  Moderate protein calorie malnutrition Encourage oral intake as tolerated.   DVT Prophylaxis: On apixaban    Code Status: Full code Family Communication: Discussed with the patient Disposition Plan: Management as outlined above.    LOS: 0 days   [place] Pager [phone] 08/27/2017, 12:04 PM  If 7PM-7AM, please contact night-coverage at [email or web address], password [facility]

## 2017-08-28 ENCOUNTER — Other Ambulatory Visit: Payer: Self-pay | Admitting: Oncology

## 2017-08-28 LAB — COMPREHENSIVE METABOLIC PANEL
ALT: 15 U/L (ref 14–54)
AST: 25 U/L (ref 15–41)
Albumin: 1.6 g/dL — ABNORMAL LOW (ref 3.5–5.0)
Alkaline Phosphatase: 148 U/L — ABNORMAL HIGH (ref 38–126)
Anion gap: 10 (ref 5–15)
BUN: 6 mg/dL (ref 6–20)
CALCIUM: 7.8 mg/dL — AB (ref 8.9–10.3)
CHLORIDE: 95 mmol/L — AB (ref 101–111)
CO2: 22 mmol/L (ref 22–32)
Creatinine, Ser: 0.52 mg/dL (ref 0.44–1.00)
GFR calc Af Amer: >60 mL/min (ref >60)
GFR calc non Af Amer: >60 mL/min (ref >60)
Glucose, Bld: 102 mg/dL — ABNORMAL HIGH (ref 65–99)
POTASSIUM: 4 mmol/L (ref 3.5–5.1)
SODIUM: 127 mmol/L — AB (ref 135–145)
TOTAL PROTEIN: 4.7 g/dL — AB (ref 6.5–8.1)
Total Bilirubin: 0.5 mg/dL (ref 0.3–1.2)

## 2017-08-28 LAB — CBC
HCT: 23.2 % — ABNORMAL LOW (ref 36.0–46.0)
Hemoglobin: 7.3 g/dL — ABNORMAL LOW (ref 12.0–15.0)
MCH: 26.2 pg (ref 26.0–34.0)
MCHC: 31.5 g/dL (ref 30.0–36.0)
MCV: 83.2 fL (ref 78.0–100.0)
PLATELETS: 264 10*3/uL (ref 150–400)
RBC: 2.79 MIL/uL — AB (ref 3.87–5.11)
RDW: 17.1 % — AB (ref 11.5–15.5)
WBC: 3.1 10*3/uL — AB (ref 4.0–10.5)

## 2017-08-28 NOTE — Progress Notes (Signed)
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  She had several paracentesis during that hospitalization.  Presented with complains of uncontrolled pain at home and abdominal distention.  She was discharged on Duragesic patch however her pharmacy has not been able to obtain it for her.  Patient was seen in the emergency department.  She underwent paracentesis and was hospitalized for pain control.  Reason for Visit: Malignant ascites.  Nausea and vomiting.  Consultants: None  Procedures: Ultrasound-guided paracentesis 2/22  Antibiotics: Ceftriaxone  Subjective/Interval History: Patient states that she is feeling better no nausea vomiting appears to have subsided.  However she continues to have abdominal pain and feels like the fluid in her abdomen has reaccumulated.  She called her pharmacy yesterday and they still do not have any of the Duragesic patches.    ROS: Denies any shortness of breath.  Objective:  Vital Signs  Vitals:   08/27/17 0448 08/27/17 1447 08/27/17 2101 08/28/17 0546  BP: 108/65 105/84 130/81 105/63  Pulse: (!) 107 (!) 105 (!) 120 (!) 111  Resp: (!) 24 (!) 22 (!) 23 (!) 21  Temp: 98.4 F (36.9 C) 98.4 F (36.9 C) 98.8 F (37.1 C) 99 F (37.2 C)  TempSrc: Oral Oral Oral Oral  SpO2: 100% 100% 100% 98%    Intake/Output Summary (Last 24 hours) at 08/28/2017 0926 Last data filed at 08/28/2017 0310 Gross per 24 hour  Intake 940 ml  Output -  Net 940 ml   There were no vitals filed for this visit.  General appearance: Awake alert.  In no distress. Resp: Diminished air entry at the bases.  No definite crackles wheezing or  rhonchi. Cardio: S1-S2 is normal regular.  No S3-S4.  No rubs murmurs or bruit  GI: Abdomen is mildly distended.  Hepatomegaly appreciated.  Tender to palpation.  No rebound rigidity or guarding.  Bowel sounds are present.   Extremities: No edema Neurologic: No obvious focal neurological deficits.  Lab Results:  Data Reviewed: I have personally reviewed following labs and imaging studies  CBC: Recent Labs  Lab 08/22/17 0500 08/23/17 0557 08/23/17 1432 08/26/17 0914 08/27/17 1230 08/28/17 0500  WBC 6.4 5.7  --  2.2* 2.8* 3.1*  NEUTROABS  --   --   --  1.8  --   --   HGB 8.2* 7.1* 7.7* 7.7* 7.5* 7.3*  HCT 25.9* 21.6* 23.9* 23.4* 23.1* 23.2*  MCV 82.2 81.5  --  80.7 82.8 83.2  PLT 213 184  --  280 282 163    Basic Metabolic Panel: Recent Labs  Lab 08/21/17 1129 08/22/17 0500 08/23/17 0557 08/26/17 0914 08/27/17 1230 08/28/17 0500  NA 129* 129* 130* 126* 127* 127*  K 4.0 4.2 4.0 3.4* 3.7 4.0  CL 98* 96* 97* 91* 96* 95*  CO2 25 24 25  21* 23 22  GLUCOSE 123* 92 100* 96 97 102*  BUN 10 7 6 6  <5* 6  CREATININE 0.48 0.34* 0.40* 0.48 0.48 0.52  CALCIUM 8.1* 8.2* 8.1* 8.2* 7.8* 7.8*  MG 1.9  --  1.8  --   --   --     GFR: Estimated Creatinine Clearance: 87.4 mL/min (by C-G formula based on SCr of 0.52 mg/dL).  Liver Function Tests: Recent Labs  Lab 08/22/17 0500 08/23/17 0557 08/26/17 0914 08/27/17 1230 08/28/17 0500  AST 73* 35 23 24 25   ALT 66* 38 18 17 15   ALKPHOS 231* 191* 156* 149* 148*  BILITOT 0.9 0.6 0.5 0.6 0.5  PROT 5.4* 5.2* 5.7* 5.3* 4.7*  ALBUMIN 1.6* 2.0* 2.1* 1.8* 1.6*    Recent Labs  Lab 08/26/17 0914  LIPASE 29    Recent Results (from the past 240 hour(s))  Culture, body fluid-bottle     Status: None   Collection Time: 08/18/17 11:46 AM  Result Value Ref Range Status   Specimen Description PERITONEAL  Final   Special Requests NONE  Final   Culture   Final    NO GROWTH 5 DAYS Performed at [facility], [address]  [place], [place] [address]    Report Status 08/23/2017 FINAL  Final  Gram stain     Status: None   Collection Time: 08/18/17 11:46 AM  Result Value Ref Range Status   Specimen Description PERITONEAL  Final   Special Requests NONE  Final   Gram Stain   Final    MODERATE WBC PRESENT,BOTH PMN AND MONONUCLEAR NO ORGANISMS SEEN Performed at [facility], [place] [address], [place], [place] [address]    Report Status 08/18/2017 FINAL  Final  Blood Culture (routine x 2)     Status: None (Preliminary result)   Collection Time: 08/26/17  9:14 AM  Result Value Ref Range Status   Specimen Description   Final    BLOOD PORTA CATH Performed at [facility] [address] [place], [place] [address]    Special Requests   Final    BOTTLES DRAWN AEROBIC AND ANAEROBIC Blood Culture adequate volume Performed at [facility] [address], [place], [place] [address]    Culture   Final    NO GROWTH 1 DAY Performed at [facility], [place] [address] [place], [place] [address]    Report Status PENDING  Incomplete  Body fluid culture     Status: None (Preliminary result)   Collection Time: 08/26/17 12:08 PM  Result Value Ref Range Status   Specimen Description   Final    PERITONEAL Performed at [facility] [address], [place], [place] [address]    Special Requests   Final    NONE Performed at [facility], [place] [address], [place], [place] [address]    Gram Stain   Final    RARE WBC PRESENT, PREDOMINANTLY MONONUCLEAR NO ORGANISMS SEEN    Culture   Final    NO GROWTH < 24 HOURS Performed at [facility], [place] [address], [place], [place] [address]    Report Status PENDING  Incomplete  Blood Culture (routine x 2)     Status: None (Preliminary result)   Collection Time: 08/26/17  6:17 PM  Result Value Ref Range Status   Specimen Description   Final    BLOOD RIGHT ARM Performed at [facility] [address] [place], [place], [place] [address]    Special Requests   Final    BOTTLES DRAWN AEROBIC AND ANAEROBIC Blood Culture adequate volume Performed at [facility] [address] [place], [place] [address]    Culture   Final    NO GROWTH < 24 HOURS Performed at [facility] [address] [place], [place] [address]  Report Status PENDING  Incomplete      Radiology Studies: US Paracentesis  Result Date: 08/26/2017 INDICATION: History of metastatic rectal carcinoma, recurrent malignant ascites. Request made for diagnostic and therapeutic paracentesis. EXAM: ULTRASOUND GUIDED DIAGNOSTIC AND THERAPEUTIC PARACENTESIS MEDICATIONS: 1% LIDOCAINE LOCAL COMPLICATIONS: None immediate. PROCEDURE: Informed written consent was obtained from the patient after a discussion of the risks, benefits and alternatives to treatment. A timeout was performed prior to the initiation of the procedure. Initial ultrasound scanning demonstrates a moderate amount of ascites within the left mid to lower abdominal quadrant. The left mid to lower abdomen was prepped and draped in the usual sterile fashion. 1% lidocaine was used for local anesthesia. Following this, a 6 Fr Safe-T-Centesis catheter was introduced. An ultrasound image was saved for documentation purposes. The paracentesis was performed. The catheter was removed and a dressing was applied. The patient tolerated the procedure well without immediate post procedural complication. FINDINGS: A total of approximately 3.8 liters of bloody fluid was removed. Samples were sent to the laboratory as requested by the clinical team. IMPRESSION: Successful ultrasound-guided diagnostic and therapeutic paracentesis yielding 3.8 liters liters of peritoneal fluid. Read by: Rowe Robert, PA-C Electronically Signed   By: Jerilynn Mages.  Shick M.D.   On: 08/26/2017 12:52   Dg Abd 2 Views  Result Date: 08/27/2017 CLINICAL DATA:  Nausea and  vomiting.  History of colon cancer. EXAM: ABDOMEN - 2 VIEW COMPARISON:  07/11/2017; CT abdomen and pelvis - 08/11/2017; ultrasound-guided paracentesis - 08/26/2017 FINDINGS: Mild to moderate gas distention with several loops of large and small bowel without definitive evidence of enteric obstruction. Several loops of bowel appears somewhat patulous compatible with known extensive peritoneal carcinomatosis. An ostomy overlies the left lower abdomen. No pneumoperitoneum, pneumatosis or portal venous gas Limited visualization of lower thorax demonstrates port a catheter tip overlying the superior cavoatrial junction. No focal airspace opacities. No pleural effusion. No definite acute osseus abnormalities. IMPRESSION: 1. No definite evidence of enteric obstruction. 2. Patulous appearance of several loops of bowel compatible with known history of extensive peritoneal carcinomatosis. Electronically Signed   By: Sandi Mariscal M.D.   On: 08/27/2017 13:28     Medications:  Scheduled: . apixaban  5 mg Oral BID  . doxycycline  100 mg Oral Q12H  . fentaNYL  150 mcg Transdermal Q72H  . metoprolol tartrate  12.5 mg Oral BID  . potassium chloride  10 mEq Oral Daily  . sodium chloride flush  3 mL Intravenous Q12H   Continuous: . sodium chloride    . cefTRIAXone (ROCEPHIN)  IV Stopped (08/27/17 1645)   EQA:STMHDQ chloride, haloperidol, HYDROmorphone (DILAUDID) injection, morphine, ondansetron **OR** ondansetron (ZOFRAN) IV, promethazine, sodium chloride flush  Assessment/Plan:  Principal Problem:   Abdominal pain Active Problems:   Malnutrition of moderate degree   Rectal cancer (HCC)   Ascites    Nausea and vomiting Etiology unclear.  Probably due to her pain as well as metastatic processes as well as medications.  Symptoms are better controlled now.  She needs to mobilize.  Abdominal films done yesterday did not show any acute process.    Stage IV rectal cancer with malignant ascites She has had  multiple paracentesis in the last 2-3 weeks.  Underwent paracentesis 2/22.  Fluid analysis reviewed.  No growth on fluid cultures.  Previous cultures have been negative for bacteria.  During last hospitalization there was some concern for spontaneous bacterial peritonitis.  She was discharged on doxycycline.  Currently is on doxycycline and ceftriaxone.  Gram stain is negative.  Okay to discontinue ceftriaxone. She received chemotherapy during her last hospitalization.  According to patient the next dose is due on the 28th of this month.  She was initially being treated by oncologist in [facility] however is now being treated by Dr. Benay Spice.  Cancer related pain Pain has been poorly controlled.  She could not fill her fentanyl patch prescription as her pharmacy did not have this medication.  She is waiting on them.  Continue Duragesic patch for now.  Continue IV Dilaudid for breakthrough pain.  Recently diagnosed DVT of the left femoral and common femoral veins She is on anticoagulation with Eliquis which is being continued.  Hyponatremia Probably due to liver metastases and volume overload.  She has had chronic hyponatremia.  Sodium level remains low but stable.  Normocytic anemia Hemoglobin is stable.  No evidence of overt bleeding.  Moderate protein calorie malnutrition Encourage oral intake as tolerated.   DVT Prophylaxis: On apixaban    Code Status: Full code Family Communication: Discussed with the patient Disposition Plan: Management as outlined above.  Mobilize.    LOS: 1 day   [facility] Pager [phone] 08/28/2017, 9:26 AM  If 7PM-7AM, please contact night-coverage at [email or web address], password [facility]

## 2017-08-29 DIAGNOSIS — D701 Agranulocytosis secondary to cancer chemotherapy: Secondary | ICD-10-CM

## 2017-08-29 DIAGNOSIS — C787 Secondary malignant neoplasm of liver and intrahepatic bile duct: Secondary | ICD-10-CM

## 2017-08-29 DIAGNOSIS — G893 Neoplasm related pain (acute) (chronic): Secondary | ICD-10-CM

## 2017-08-29 DIAGNOSIS — K5900 Constipation, unspecified: Secondary | ICD-10-CM

## 2017-08-29 DIAGNOSIS — C786 Secondary malignant neoplasm of retroperitoneum and peritoneum: Principal | ICD-10-CM

## 2017-08-29 DIAGNOSIS — D649 Anemia, unspecified: Secondary | ICD-10-CM

## 2017-08-29 LAB — BODY FLUID CULTURE: Culture: NO GROWTH

## 2017-08-29 MED ORDER — SENNA 8.6 MG PO TABS
1.0000 | ORAL_TABLET | Freq: Two times a day (BID) | ORAL | Status: DC
  Administered 2017-08-29 – 2017-09-05 (×12): 8.6 mg via ORAL
  Filled 2017-08-29 (×12): qty 1

## 2017-08-29 MED ORDER — ONDANSETRON 4 MG PO TBDP
4.0000 mg | ORAL_TABLET | Freq: Three times a day (TID) | ORAL | Status: DC
  Administered 2017-08-29 – 2017-09-02 (×11): 4 mg via ORAL
  Filled 2017-08-29 (×11): qty 1

## 2017-08-29 MED ORDER — POLYETHYLENE GLYCOL 3350 17 G PO PACK
17.0000 g | PACK | Freq: Every day | ORAL | Status: DC
  Administered 2017-08-29 – 2017-08-30 (×2): 17 g via ORAL
  Filled 2017-08-29 (×2): qty 1

## 2017-08-29 MED ORDER — SENNA 8.6 MG PO TABS
1.0000 | ORAL_TABLET | Freq: Every day | ORAL | Status: DC
  Administered 2017-08-29: 8.6 mg via ORAL
  Filled 2017-08-29: qty 1

## 2017-08-29 MED ORDER — LACTULOSE 10 GM/15ML PO SOLN
10.0000 g | Freq: Two times a day (BID) | ORAL | Status: DC
  Administered 2017-08-29 – 2017-09-02 (×7): 10 g via ORAL
  Filled 2017-08-29 (×6): qty 15

## 2017-08-29 NOTE — Progress Notes (Signed)
[facility] PROGRESS NOTE  Amber Silva [id number] DOB: 02/19/1991 DOA: 08/26/2017  PCP: [name]., MD  Brief History/Interval Summary: 27 year old female with a past medical history of stage IV rectal cancer with metastatic disease, ascites recently discharged on February 19 after being managed for malignant ascites and possible spontaneous bacterial peritonitis.  She had several paracentesis during that hospitalization.  Presented with complains of uncontrolled pain at home and abdominal distention.  She was discharged on Duragesic patch however her pharmacy has not been able to obtain it for her.  Patient was seen in the emergency department.  She underwent paracentesis and was hospitalized for pain control.  Reason for Visit: Malignant ascites.  Nausea and vomiting.  Consultants: None  Procedures: Ultrasound-guided paracentesis 2/22  Antibiotics: Ceftriaxone discontinued on 2/24  Subjective/Interval History: Patient states that she feels well.  Pain is reasonably well controlled.  However she continues to have episodes of vomiting after eating her meals.  Has not had any bowel movement in the last many days.     ROS: Denies any shortness of breath.  Objective:  Vital Signs  Vitals:   08/28/17 0546 08/28/17 1328 08/28/17 2258 08/29/17 0435  BP: 105/63 103/77 111/76 112/73  Pulse: (!) 111 (!) 118 (!) 120 (!) 117  Resp: (!) 21 20 20 16   Temp: 99 F (37.2 C) 98.9 F (37.2 C) 99 F (37.2 C) 98.7 F (37.1 C)  TempSrc: Oral Oral Oral Oral  SpO2: 98% 98% 100% 100%    Intake/Output Summary (Last 24 hours) at 08/29/2017 0840 Last data filed at 08/28/2017 1950 Gross per 24 hour  Intake 720 ml  Output -  Net 720 ml   There were no vitals filed for this visit.  General appearance: Awake alert.  In no distress Resp: Clear to auscultation bilaterally.  Normal effort. Cardio: S1-S2 is normal regular.  No S3-S4.  No rubs murmurs or bruit GI: Abdomen noted to  be slightly more distended today compared to yesterday.  Hepatomegaly is appreciated and is tender to palpation.  No rebound rigidity or guarding.  Bowel sounds are present.   Extremities: No edema Neurologic: No focal neurological deficits.  Lab Results:  Data Reviewed: I have personally reviewed following labs and imaging studies  CBC: Recent Labs  Lab 08/23/17 0557 08/23/17 1432 08/26/17 0914 08/27/17 1230 08/28/17 0500  WBC 5.7  --  2.2* 2.8* 3.1*  NEUTROABS  --   --  1.8  --   --   HGB 7.1* 7.7* 7.7* 7.5* 7.3*  HCT 21.6* 23.9* 23.4* 23.1* 23.2*  MCV 81.5  --  80.7 82.8 83.2  PLT 184  --  280 282 409    Basic Metabolic Panel: Recent Labs  Lab 08/23/17 0557 08/26/17 0914 08/27/17 1230 08/28/17 0500  NA 130* 126* 127* 127*  K 4.0 3.4* 3.7 4.0  CL 97* 91* 96* 95*  CO2 25 21* 23 22  GLUCOSE 100* 96 97 102*  BUN 6 6 <5* 6  CREATININE 0.40* 0.48 0.48 0.52  CALCIUM 8.1* 8.2* 7.8* 7.8*  MG 1.8  --   --   --     GFR: Estimated Creatinine Clearance: 87.4 mL/min (by C-G formula based on SCr of 0.52 mg/dL).  Liver Function Tests: Recent Labs  Lab 08/23/17 0557 08/26/17 0914 08/27/17 1230 08/28/17 0500  AST 35 23 24 25   ALT 38 18 17 15   ALKPHOS 191* 156* 149* 148*  BILITOT 0.6 0.5 0.6 0.5  PROT 5.2* 5.7* 5.3*  4.7*  ALBUMIN 2.0* 2.1* 1.8* 1.6*    Recent Labs  Lab 08/26/17 0914  LIPASE 29    Recent Results (from the past 240 hour(s))  Blood Culture (routine x 2)     Status: None (Preliminary result)   Collection Time: 08/26/17  9:14 AM  Result Value Ref Range Status   Specimen Description   Final    BLOOD PORTA CATH Performed at [facility] [address] [place], [place] [address]    Special Requests   Final    BOTTLES DRAWN AEROBIC AND ANAEROBIC Blood Culture adequate volume Performed at [facility] [address], [place], [place] [address]    Culture   Final    NO GROWTH 2 DAYS Performed at [facility] [address], [place], [place] [address]    Report Status PENDING  Incomplete  Body fluid culture     Status: None (Preliminary result)   Collection Time: 08/26/17 12:08 PM  Result Value Ref Range Status   Specimen Description   Final    PERITONEAL Performed at [facility] [address] [place], [place] [address]    Special Requests   Final    NONE Performed at [facility], [place] [address], [place], [place] [address]    Gram Stain   Final    RARE WBC PRESENT, PREDOMINANTLY MONONUCLEAR NO ORGANISMS SEEN    Culture   Final    NO GROWTH 2 DAYS Performed at [facility], [place] [address], [place], [place] [address]    Report Status PENDING  Incomplete  Blood Culture (routine x 2)     Status: None (Preliminary result)   Collection Time: 08/26/17  6:17 PM  Result Value Ref Range Status   Specimen Description   Final    BLOOD RIGHT ARM Performed at [facility] [address] [place], [place] [address]    Special Requests   Final    BOTTLES DRAWN AEROBIC AND ANAEROBIC Blood Culture adequate volume Performed at [facility] [address] [place], [place] [address]    Culture   Final    NO GROWTH 2 DAYS Performed at [facility] [address], [place], [place] [address]    Report Status PENDING  Incomplete      Radiology Studies: Dg Abd 2 Views  Result Date: 08/27/2017 CLINICAL DATA:  Nausea and vomiting.  History of colon cancer. EXAM: ABDOMEN - 2 VIEW COMPARISON:  07/11/2017; CT abdomen and pelvis - 08/11/2017; ultrasound-guided paracentesis - 08/26/2017 FINDINGS: Mild to moderate gas distention with several loops of large and small bowel without definitive evidence of enteric obstruction. Several loops of bowel appears somewhat patulous compatible with known extensive peritoneal carcinomatosis. An ostomy overlies the left lower abdomen. No pneumoperitoneum, pneumatosis or  portal venous gas Limited visualization of lower thorax demonstrates port a catheter tip overlying the superior cavoatrial junction. No focal airspace opacities. No pleural effusion. No definite acute osseus abnormalities. IMPRESSION: 1. No definite evidence of enteric obstruction. 2. Patulous appearance of several loops of bowel compatible with known history of extensive peritoneal carcinomatosis. Electronically Signed   By: Sandi Mariscal M.D.   On: 08/27/2017 13:28     Medications:  Scheduled: . apixaban  5 mg Oral BID  . doxycycline  100 mg Oral Q12H  . fentaNYL  150 mcg Transdermal Q72H  . metoprolol tartrate  12.5 mg Oral BID  .  potassium chloride  10 mEq Oral Daily  . sodium chloride flush  3 mL Intravenous Q12H   Continuous: . sodium chloride     OHY:WVPXTG chloride, haloperidol, HYDROmorphone (DILAUDID) injection, morphine, ondansetron **OR** ondansetron (ZOFRAN) IV, promethazine, sodium chloride flush  Assessment/Plan:  Principal Problem:   Abdominal pain Active Problems:   Malnutrition of moderate degree   Rectal cancer (HCC)   Ascites    Nausea and vomiting Etiology remains unclear but possibly related to peritoneal metastatic process.  X-ray did not show any obstruction.  She also reports constipation which could also be contributing.  We will give her Zofran before each meal.  Given laxatives.    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  Underwent paracentesis 2/22.  Fluid analysis reviewed.  No growth on fluid cultures.  Previous cultures have been negative for bacteria.  During last hospitalization there was some concern for spontaneous bacterial peritonitis.  Initially it was thought that she was discharged on Doxy for SBP however apparently this was prescribed for a rash.  Continue for now.  Ceftriaxone discontinued on 2/24.  Appears to be reaccumulating fluid.  May have to do paracentesis again tomorrow.  She received chemotherapy during her last hospitalization.  According to patient the next dose is due on the 28th of this month.  She was initially being treated by oncologist in [facility] however is now being treated by Dr. Benay Spice.  Cancer related pain Patient was discharged on fentanyl patches during last hospitalization.  She could not fill her fentanyl patch prescription as her pharmacy did not have this medication.  She is waiting on them.  Continue Duragesic patch for now.  Continue IV Dilaudid for breakthrough pain.  Pain is reasonably well controlled.  Recently diagnosed DVT of the left femoral and common femoral veins She is on anticoagulation with Eliquis which is being continued.  Hyponatremia Probably due to liver metastases and volume overload.  She has had chronic hyponatremia.  Sodium level remains low but stable.  Normocytic anemia Hemoglobin is stable.  No evidence of overt bleeding.  Moderate protein calorie malnutrition Encourage oral intake as tolerated.   DVT Prophylaxis: On apixaban    Code Status: Full code Family Communication: Discussed with the patient Disposition Plan: Management as outlined above.  Mobilize.   LOS: 2 days   [facility] Pager [phone] 08/29/2017, 8:40 AM  If 7PM-7AM, please contact night-coverage at [email or web address], password [facility]

## 2017-08-29 NOTE — Progress Notes (Signed)
   08/29/17 1100  Clinical Encounter Type  Visited With Patient  Visit Type Initial;Psychological support;Spiritual support  Referral From Chaplain  Consult/Referral To Chaplain  Spiritual Encounters  Spiritual Needs Emotional;Other (Comment) (Spiritual Care Conversation/Support)  Stress Factors  Patient Stress Factors None identified   I visited with the patient per referral from the [place].  The patient was asleep at the time of my visit and groggy when I briefly spoke with her.  The patient did not identify any needs at this time.  I will follow up with her at a later time.  Please, contact Spiritual Care for further assistance.   Chaplain Shanon Ace M.Div., [facility]

## 2017-08-29 NOTE — Progress Notes (Signed)
IP PROGRESS NOTE  Subjective:   Ms. Giel was discharged 08/23/2017.  She was readmitted on 08/26/2017 with recurrent abdominal distention and pain.  She underwent a paracentesis for 3.8 L of bloody fluid on 08/26/2017. She reports adequate pain control at present.  She would like to have another paracentesis prior to discharge from the hospital. No rash.  She had diarrhea following discharge from the hospital.  This has resolved.  She is now passing gas.  Objective: Vital signs in last 24 hours: Blood pressure 112/73, pulse (!) 117, temperature 98.7 F (37.1 C), temperature source Oral, resp. rate 16, SpO2 100 %.  Intake/Output from previous day: 02/24 0701 - 02/25 0700 In: 720 [P.O.:720] Out: -   Physical Exam:  HEENT: No thrush Lungs: Decreased breath sounds at the right lower chest, no respiratory distress Cardiac: Regular rate and rhythm Abdomen: Mildly distended, left lower quadrant colostomy, firm fullness in the right lower abdomen with associated tenderness Extremities: No leg edema Skin: No rash  Portacath/PICC-without erythema  Lab Results: Recent Labs    08/27/17 1230 08/28/17 0500  WBC 2.8* 3.1*  HGB 7.5* 7.3*  HCT 23.1* 23.2*  PLT 282 264   ANC 1.8 on 08/26/2017 BMET Recent Labs    08/27/17 1230 08/28/17 0500  NA 127* 127*  K 3.7 4.0  CL 96* 95*  CO2 23 22  GLUCOSE 97 102*  BUN <5* 6  CREATININE 0.48 0.52  CALCIUM 7.8* 7.8*    Studies/Results: Dg Abd 2 Views  Result Date: 08/27/2017 CLINICAL DATA:  Nausea and vomiting.  History of colon cancer. EXAM: ABDOMEN - 2 VIEW COMPARISON:  07/11/2017; CT abdomen and pelvis - 08/11/2017; ultrasound-guided paracentesis - 08/26/2017 FINDINGS: Mild to moderate gas distention with several loops of large and small bowel without definitive evidence of enteric obstruction. Several loops of bowel appears somewhat patulous compatible with known extensive peritoneal carcinomatosis. An ostomy overlies the left lower  abdomen. No pneumoperitoneum, pneumatosis or portal venous gas Limited visualization of lower thorax demonstrates port a catheter tip overlying the superior cavoatrial junction. No focal airspace opacities. No pleural effusion. No definite acute osseus abnormalities. IMPRESSION: 1. No definite evidence of enteric obstruction. 2. Patulous appearance of several loops of bowel compatible with known history of extensive peritoneal carcinomatosis. Electronically Signed   By: Sandi Mariscal M.D.   On: 08/27/2017 13:28    Medications: I have reviewed the patient's current medications.  Assessment/Plan: 1. Rectal cancer July 2018  Colonoscopy 01/25/2017-biopsy showedinvasive moderately differentiated adenocarcinoma with mucinous features.   CT chest/abdomen/pelvis 02/11/2017-very large ulcerated rectal mass with severe wall thickening over the distal 11.2 cm of the rectum extending to the anus with surrounding perirectal adenopathy and pelvic sidewall adenopathy.At least 2 metastatic lesions in the right hepatic lobe.   MRI of the pelvis 02/14/2017-T4b,N2bulky rectal mass with deep infiltrative invasion of the bilateral pelvic sidewalls, presacral space, mesorectal fascia, distal branches of the internal iliac vasculature and sacral plexus. There was abnormal signal intensity along both sciatic nerves possibly representing perineural extension. The mass was noted to approximate the posterior vaginal wall without definite invasion.   PET scan 02/15/2017-colorectal malignancy with mesorectal fascia infiltration and 2 FDG avid hepatic metastases. Regional lymphadenopathy found on prior imaging was not FDG avid on the PET scan.   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  Foundation 1-KRAS/NRASwild-type; microsatellite stable; tumor mutational burden 3; BRCA1 rearrangement intron 2  4 cycles of FOLFIRINOX9/11/2016 through 04/27/2017.   CT abdomen/pelvis 05/06/2017-large rectal mass and known  hepatic metastases similar to recent comparison.  Status postpartial course of radiation/Xeloda.   CTs 07/12/2017-large necrotic invasive rectal mass; increased size of known liver metastatic lesions; interval increase in size of peritoneal implants; new development of moderate volume abdominal ascites; gas again present within the anterior rectal wall closely opposing the gas-filled vagina. Rectovaginal fistula not excluded.   Admitted to [facility] 08/12/2017 with abdominal pain and swelling  CT abdomen/pelvis 08/11/2017-large rectal mass that appeared necrotic and with discrete tract extending toward the vagina/perineum; interval development of massive ascites; diffuse caking of the omentum; descending colostomy bulging due to peritoneal metastases; hepatic metastases measuring up to 4.7 cm in the upper right liver; large thrombus in the left femoral and common femoral veins.  Cycle 1 FOLFIRI/panitumumab 08/18/2017 2. Left lower extremity DVT. On Eliquis. 3. Ascitessecondary to #1.  Status post repeat paracentesis procedures 4. Tachycardia-likely secondary to anemia, intravascular depletion, critical illness 5. Anemia secondary to chronic disease, chemotherapy/radiation, and phlebotomy.  Red cell transfusion 08/19/2017 6. Leukopenia secondary to chemotherapy 7. Pain secondary to metastatic rectal cancer   She has rapidly reaccumulating malignant ascites.  She underwent a paracentesis on 08/26/2017.  She has abdominal pain secondary to carcinomatosis.  She had diarrhea last week, but is now constipated.  She tolerated the first cycle of FOLFIRI/panitumumab well and is scheduled for cycle 2 on 09/01/2017.  Hopefully she will be discharged to home so she can receive cycle 2 with an outpatient.  Recommendations: 1.  Continue Duragesic and MSIR for pain 2.  Add lactulose for constipation 3.  Follow-up as scheduled at the Cancer center 09/01/2017 for cycle 2 FOLFIRI/panitumumab 4.   Palliative paracentesis if needed prior to discharge    LOS: 2 days   Betsy Coder, MD   08/29/2017, 11:55 AM

## 2017-08-30 ENCOUNTER — Inpatient Hospital Stay (HOSPITAL_COMMUNITY): Payer: Medicaid Other

## 2017-08-30 LAB — BASIC METABOLIC PANEL
Anion gap: 9 (ref 5–15)
BUN: 7 mg/dL (ref 6–20)
CHLORIDE: 97 mmol/L — AB (ref 101–111)
CO2: 22 mmol/L (ref 22–32)
CREATININE: 0.49 mg/dL (ref 0.44–1.00)
Calcium: 8 mg/dL — ABNORMAL LOW (ref 8.9–10.3)
GFR calc Af Amer: >60 mL/min (ref >60)
GFR calc non Af Amer: >60 mL/min (ref >60)
GLUCOSE: 98 mg/dL (ref 65–99)
POTASSIUM: 4 mmol/L (ref 3.5–5.1)
Sodium: 128 mmol/L — ABNORMAL LOW (ref 135–145)

## 2017-08-30 LAB — CBC
HEMATOCRIT: 22.7 % — AB (ref 36.0–46.0)
Hemoglobin: 7.3 g/dL — ABNORMAL LOW (ref 12.0–15.0)
MCH: 26.8 pg (ref 26.0–34.0)
MCHC: 32.2 g/dL (ref 30.0–36.0)
MCV: 83.5 fL (ref 78.0–100.0)
PLATELETS: 368 10*3/uL (ref 150–400)
RBC: 2.72 MIL/uL — ABNORMAL LOW (ref 3.87–5.11)
RDW: 17.7 % — AB (ref 11.5–15.5)
WBC: 2.6 10*3/uL — ABNORMAL LOW (ref 4.0–10.5)

## 2017-08-30 MED ORDER — LIDOCAINE HCL 1 % IJ SOLN
INTRAMUSCULAR | Status: AC
  Filled 2017-08-30: qty 10

## 2017-08-30 MED ORDER — GI COCKTAIL ~~LOC~~
30.0000 mL | Freq: Once | ORAL | Status: AC
  Administered 2017-08-30: 30 mL via ORAL
  Filled 2017-08-30: qty 30

## 2017-08-30 MED ORDER — LACTULOSE 10 GM/15ML PO SOLN: 10.0000 g | Freq: Two times a day (BID) | ORAL | 0 refills | Status: DC

## 2017-08-30 MED ORDER — ONDANSETRON HCL 4 MG PO TABS: 4.0000 mg | ORAL_TABLET | Freq: Three times a day (TID) | ORAL | 1 refills | Status: DC

## 2017-08-30 MED ORDER — FENTANYL 75 MCG/HR TD PT72: 150.0000 ug | MEDICATED_PATCH | TRANSDERMAL | 0 refills | Status: DC

## 2017-08-30 NOTE — Progress Notes (Signed)
IP PROGRESS NOTE  Subjective:   Amber Silva continues to have intermittent nausea and vomiting.  She reports becoming nauseated sometimes after eating and ambulating.  She remains constipated.  Objective: Vital signs in last 24 hours: Blood pressure 103/73, pulse (!) 112, temperature 98 F (36.7 C), temperature source Oral, resp. rate 13, SpO2 100 %.  Intake/Output from previous day: 02/25 0701 - 02/26 0700 In: 720 [P.O.:720] Out: -   Physical Exam:  HEENT: No thrush Lungs: There are anteriorly Cardiac: Regular rate and rhythm Abdomen: Distended today, left lower quadrant colostomy, firm fullness in the right lower abdomen with associated tenderness Extremities: No leg edema Skin: No rash  Portacath/PICC-without erythema  Lab Results: Recent Labs    08/28/17 0500 08/30/17 0500  WBC 3.1* 2.6*  HGB 7.3* 7.3*  HCT 23.2* 22.7*  PLT 264 368   ANC 1.8 on 08/26/2017 BMET Recent Labs    08/28/17 0500 08/30/17 0500  NA 127* 128*  K 4.0 4.0  CL 95* 97*  CO2 22 22  GLUCOSE 102* 98  BUN 6 7  CREATININE 0.52 0.49  CALCIUM 7.8* 8.0*    Studies/Results: No results found.  Medications: I have reviewed the patient's current medications.  Assessment/Plan: 1. Rectal cancer July 2018  Colonoscopy 01/25/2017-biopsy showedinvasive moderately differentiated adenocarcinoma with mucinous features.   CT chest/abdomen/pelvis 02/11/2017-very large ulcerated rectal mass with severe wall thickening over the distal 11.2 cm of the rectum extending to the anus with surrounding perirectal adenopathy and pelvic sidewall adenopathy.At least 2 metastatic lesions in the right hepatic lobe.   MRI of the pelvis 02/14/2017-T4b,N2bulky rectal mass with deep infiltrative invasion of the bilateral pelvic sidewalls, presacral space, mesorectal fascia, distal branches of the internal iliac vasculature and sacral plexus. There was abnormal signal intensity along both sciatic nerves possibly  representing perineural extension. The mass was noted to approximate the posterior vaginal wall without definite invasion.   PET scan 02/15/2017-colorectal malignancy with mesorectal fascia infiltration and 2 FDG avid hepatic metastases. Regional lymphadenopathy found on prior imaging was not FDG avid on the PET scan.   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  Foundation 1-KRAS/NRASwild-type; microsatellite stable; tumor mutational burden 3; BRCA1 rearrangement intron 2  4 cycles of FOLFIRINOX9/11/2016 through 04/27/2017.   CT abdomen/pelvis 05/06/2017-large rectal mass and known hepatic metastases similar to recent comparison.  Status postpartial course of radiation/Xeloda.   CTs 07/12/2017-large necrotic invasive rectal mass; increased size of known liver metastatic lesions; interval increase in size of peritoneal implants; new development of moderate volume abdominal ascites; gas again present within the anterior rectal wall closely opposing the gas-filled vagina. Rectovaginal fistula not excluded.   Admitted to [facility] 08/12/2017 with abdominal pain and swelling  CT abdomen/pelvis 08/11/2017-large rectal mass that appeared necrotic and with discrete tract extending toward the vagina/perineum; interval development of massive ascites; diffuse caking of the omentum; descending colostomy bulging due to peritoneal metastases; hepatic metastases measuring up to 4.7 cm in the upper right liver; large thrombus in the left femoral and common femoral veins.  Cycle 1 FOLFIRI/panitumumab 08/18/2017 2. Left lower extremity DVT. On Eliquis. 3. Ascitessecondary to #1.  Status post repeat paracentesis procedures 4. Tachycardia-likely secondary to anemia, intravascular depletion, critical illness 5. Anemia secondary to chronic disease, chemotherapy/radiation, and phlebotomy.  Red cell transfusion 08/19/2017 6. Leukopenia secondary to chemotherapy 7. Pain secondary to metastatic  rectal cancer   She appears unchanged.  She continues to have nausea.  The nausea is most likely related to carcinomatosis and  liver metastases.  She likely has an ileus and early obstructive symptoms.  I doubt she has delayed nausea from chemotherapy, but we can consider adding Decadron if the nausea is not relieved with Zofran.  The ascites has reaccumulated.  Recommendations: 1.  Continue Duragesic and MSIR for pain 2.  Continue lactulose 3.  Trial of Decadron if nausea not relieved with Zofran today 3.  Follow-up as scheduled at the Cancer center 09/01/2017 for cycle 2 FOLFIRI/panitumumab 4.  Palliative paracentesis prior to discharge    LOS: 3 days   Betsy Coder, MD   08/30/2017, 2:21 PM

## 2017-08-30 NOTE — Progress Notes (Signed)
This CM spoke with pt about issues obtaining her Fentanyl patches after her last discharge. Pt states that the pharmacy she went to [facility] in [facility]) did not have them in stock. This CM contacted the [facility] Pharmacist about availability of the patches at this time. Per Ivin Booty the Pharmacist they are available at the [facility] on [address] in [place] [phone]). Pt states that her family can get them for her at the [place] location. MD to give new script for Fentanyl patches and previous script was discontinued at the [facility]. Marney Doctor RN,BSN,NCM [phone]

## 2017-08-30 NOTE — Progress Notes (Signed)
[facility] PROGRESS NOTE  Amber Silva WUJ:[id number] DOB: May 30, 1991 DOA: 08/26/2017  PCP: [name]., MD  Brief History/Interval Summary: 27 year old female with a past medical history of stage IV rectal cancer with metastatic disease, ascites recently discharged on February 19 after being managed for malignant ascites and possible spontaneous bacterial peritonitis.  She had several paracentesis during that hospitalization.  Presented with complains of uncontrolled pain at home and abdominal distention.  She was discharged on Duragesic patch however her pharmacy has not been able to obtain it for her.  Patient was seen in the emergency department.  She underwent paracentesis and was hospitalized for pain control.  Patient also had nausea and vomiting especially after meals.  She was started on Zofran before each meal.  Patient also has reaccumulated ascites.  Will need another paracentesis prior to discharge.  Reason for Visit: Malignant ascites.  Nausea and vomiting.  Consultants: None  Procedures:  Ultrasound-guided paracentesis 2/22  Ultrasound-guided paracentesis ordered on 2/26  Antibiotics: Ceftriaxone discontinued on 2/24  Subjective/Interval History: Patient states that the Zofran before meals has helped.  She did not have any vomiting yesterday after lunch or supper.  Feels a little nauseated this morning.  Had a small bowel movement yesterday.  Pain is about the same.  Not worse.  ROS: Denies any shortness of breath.  Objective:  Vital Signs  Vitals:   08/29/17 0435 08/29/17 1322 08/29/17 2110 08/30/17 0456  BP: 112/73 111/68 105/68 103/73  Pulse: (!) 117 (!) 102 (!) 117 (!) 112  Resp: 16 16 14 13   Temp: 98.7 F (37.1 C) 97.9 F (36.6 C) 98.2 F (36.8 C) 98 F (36.7 C)  TempSrc: Oral Oral Oral Oral  SpO2: 100% 100% 100% 100%    Intake/Output Summary (Last 24 hours) at 08/30/2017 9562 Last data filed at 08/29/2017 1655 Gross per 24 hour  Intake  720 ml  Output -  Net 720 ml   There were no vitals filed for this visit.  General appearance: Awake alert.  In no distress Resp: Clear to auscultation bilaterally.  No wheezing rales or rhonchi Cardio: S1-S2 is normal regular.  No S3-S4.  No rubs murmurs of bruit GI: Abdomen is more distended today compared to yesterday.  Tender hepatomegaly.  No rebound rigidity or guarding.  Bowel sounds present.   Extremities: No edema Neurologic: No focal neurological deficits.  Lab Results:  Data Reviewed: I have personally reviewed following labs and imaging studies  CBC: Recent Labs  Lab 08/23/17 1432 08/26/17 0914 08/27/17 1230 08/28/17 0500 08/30/17 0500  WBC  --  2.2* 2.8* 3.1* 2.6*  NEUTROABS  --  1.8  --   --   --   HGB 7.7* 7.7* 7.5* 7.3* 7.3*  HCT 23.9* 23.4* 23.1* 23.2* 22.7*  MCV  --  80.7 82.8 83.2 83.5  PLT  --  280 282 264 130    Basic Metabolic Panel: Recent Labs  Lab 08/26/17 0914 08/27/17 1230 08/28/17 0500 08/30/17 0500  NA 126* 127* 127* 128*  K 3.4* 3.7 4.0 4.0  CL 91* 96* 95* 97*  CO2 21* 23 22 22   GLUCOSE 96 97 102* 98  BUN 6 <5* 6 7  CREATININE 0.48 0.48 0.52 0.49  CALCIUM 8.2* 7.8* 7.8* 8.0*    GFR: Estimated Creatinine Clearance: 87.4 mL/min (by C-G formula based on SCr of 0.49 mg/dL).  Liver Function Tests: Recent Labs  Lab 08/26/17 0914 08/27/17 1230 08/28/17 0500  AST 23 24 25   ALT  18 17 15   ALKPHOS 156* 149* 148*  BILITOT 0.5 0.6 0.5  PROT 5.7* 5.3* 4.7*  ALBUMIN 2.1* 1.8* 1.6*    Recent Labs  Lab 08/26/17 0914  LIPASE 29    Recent Results (from the past 240 hour(s))  Blood Culture (routine x 2)     Status: None (Preliminary result)   Collection Time: 08/26/17  9:14 AM  Result Value Ref Range Status   Specimen Description   Final    BLOOD PORTA CATH Performed at [facility] [address], [place], [place] [address]    Special Requests   Final    BOTTLES DRAWN AEROBIC AND ANAEROBIC Blood Culture  adequate volume Performed at [facility] [address] [place], [place] [address]    Culture   Final    NO GROWTH 3 DAYS Performed at [facility], [place] [address], [place], [place] [address]    Report Status PENDING  Incomplete  Body fluid culture     Status: None   Collection Time: 08/26/17 12:08 PM  Result Value Ref Range Status   Specimen Description   Final    PERITONEAL Performed at [facility] [address], [place], [place] [address]    Special Requests   Final    NONE Performed at [facility], [place] [address] [place], [place] [address]    Gram Stain   Final    RARE WBC PRESENT, PREDOMINANTLY MONONUCLEAR NO ORGANISMS SEEN    Culture   Final    NO GROWTH 3 DAYS Performed at [facility], [place] [address], [place], [place] [address]    Report Status 08/29/2017 FINAL  Final  Blood Culture (routine x 2)     Status: None (Preliminary result)   Collection Time: 08/26/17  6:17 PM  Result Value Ref Range Status   Specimen Description   Final    BLOOD RIGHT ARM Performed at [facility] [address] [place], [place] [address]    Special Requests   Final    BOTTLES DRAWN AEROBIC AND ANAEROBIC Blood Culture adequate volume Performed at [facility] [address], [place], [place] [address]    Culture   Final    NO GROWTH 3 DAYS Performed at [facility], [place] [address] [place], [place] [address]    Report Status PENDING  Incomplete      Radiology Studies: No results found.   Medications:  Scheduled: . apixaban  5 mg Oral BID  . doxycycline  100 mg Oral Q12H  . fentaNYL  150 mcg Transdermal Q72H  . lactulose  10 g Oral BID  . metoprolol tartrate  12.5 mg Oral BID  . ondansetron  4 mg Oral TID AC  . polyethylene glycol  17 g Oral Daily  . potassium chloride  10 mEq Oral Daily  . senna  1 tablet Oral BID  . sodium chloride flush  3 mL  Intravenous Q12H   Continuous: . sodium chloride     JJK:KXFGHW chloride, haloperidol, HYDROmorphone (DILAUDID) injection, morphine, [DISCONTINUED] ondansetron **OR** ondansetron (ZOFRAN) IV, promethazine, sodium chloride flush  Assessment/Plan:  Principal Problem:   Abdominal pain Active Problems:   Malnutrition of moderate degree   Rectal cancer (HCC)   Ascites   Constipation    Nausea and vomiting Etiology remains unclear but possibly related to peritoneal carcinomatosis.  X-ray did not show any obstruction.  She also reports constipation  which could also be contributing.  He was started on lactulose by her oncologist which has helped in the past.  This will be continued.  Zofran before meals has also helped.  She was encouraged to ambulate.  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  Underwent paracentesis 2/22.  Fluid analysis reviewed.  No growth on fluid cultures.  Previous cultures have been negative for bacteria.  During last hospitalization there was some concern for spontaneous bacterial peritonitis.  Initially it was thought that she was discharged on Doxy for SBP however apparently this was prescribed for a rash.  Continue for now.  Ceftriaxone discontinued on 2/24.  She has reaccumulated ascites.  Will benefit from another paracentesis prior to discharge.  Has been ordered for today.  She received chemotherapy during her last hospitalization.  According to patient the next dose is due on the 28th of this month.  She was initially being treated by oncologist in [facility] however is now being treated by Dr. Benay Spice.  Discussed with Dr. Benay Spice yesterday.  Hopefully we can get her home either later today or tomorrow so that she can get her chemotherapy on the 28th.  Cancer related pain Patient was discharged on fentanyl patches during last hospitalization.  She could not fill her fentanyl patch  prescription as her pharmacy did not have this medication.  Continue Duragesic patch for now.  Continue IV Dilaudid for breakthrough pain.  Pain is reasonably well controlled.  Case manager has identified another pharmacy which has these patches.  The previous prescription has been destroyed.  We will print out another prescription for her.  Recently diagnosed DVT of the left femoral and common femoral veins She is on anticoagulation with Eliquis which is being continued.  Hyponatremia Probably due to liver metastases and volume overload.  She has had chronic hyponatremia.  Sodium level remains low but stable.  Normocytic anemia Hemoglobin is stable.  No evidence of overt bleeding.  Moderate protein calorie malnutrition Encourage oral intake as tolerated.   DVT Prophylaxis: On apixaban    Code Status: Full code Family Communication: Discussed with the patient Disposition Plan: Await paracentesis.  If she does not have any further episodes of nausea and vomiting, she could be discharged later today after her paracentesis.  Prefer to get her home before her next chemotherapy which is due on 2/28.   LOS: 3 days   [facility] Pager [phone] 08/30/2017, 9:04 AM  If 7PM-7AM, please contact night-coverage at [email or web address], password [facility]

## 2017-08-30 NOTE — Procedures (Signed)
Ultrasound-guided  therapeutic paracentesis performed yielding 3 liters of bloody fluid. No immediate complications.

## 2017-08-31 ENCOUNTER — Inpatient Hospital Stay (HOSPITAL_COMMUNITY): Payer: Medicaid Other

## 2017-08-31 ENCOUNTER — Encounter: Payer: Self-pay | Admitting: General Practice

## 2017-08-31 LAB — CULTURE, BLOOD (ROUTINE X 2)
CULTURE: NO GROWTH
Culture: NO GROWTH
Special Requests: ADEQUATE
Special Requests: ADEQUATE

## 2017-08-31 LAB — DIFFERENTIAL
Basophils Absolute: 0 10*3/uL (ref 0.0–0.1)
Basophils Relative: 0 %
Eosinophils Absolute: 0 10*3/uL (ref 0.0–0.7)
Eosinophils Relative: 1 %
LYMPHS ABS: 0.4 10*3/uL — AB (ref 0.7–4.0)
LYMPHS PCT: 17 %
Monocytes Absolute: 0.4 10*3/uL (ref 0.1–1.0)
Monocytes Relative: 14 %
NEUTROS PCT: 68 %
Neutro Abs: 1.7 10*3/uL (ref 1.7–7.7)

## 2017-08-31 LAB — TROPONIN I

## 2017-08-31 MED ORDER — DICLOFENAC SODIUM 1 % TD GEL
2.0000 g | Freq: Four times a day (QID) | TRANSDERMAL | Status: DC
  Administered 2017-08-31 – 2017-09-08 (×34): 2 g via TOPICAL
  Filled 2017-08-31: qty 100

## 2017-08-31 MED ORDER — SODIUM CHLORIDE 0.9% FLUSH
10.0000 mL | Freq: Two times a day (BID) | INTRAVENOUS | Status: DC
  Administered 2017-08-31 – 2017-09-09 (×14): 10 mL

## 2017-08-31 MED ORDER — POLYETHYLENE GLYCOL 3350 17 G PO PACK
17.0000 g | PACK | Freq: Two times a day (BID) | ORAL | Status: DC
  Administered 2017-08-31 – 2017-09-06 (×13): 17 g via ORAL
  Filled 2017-08-31 (×17): qty 1

## 2017-08-31 MED ORDER — GI COCKTAIL ~~LOC~~
30.0000 mL | Freq: Once | ORAL | Status: AC
  Administered 2017-08-31: 30 mL via ORAL
  Filled 2017-08-31: qty 30

## 2017-08-31 MED ORDER — MAGNESIUM CITRATE PO SOLN
0.5000 | Freq: Once | ORAL | Status: AC
  Administered 2017-08-31: 0.5 via ORAL

## 2017-08-31 MED ORDER — SODIUM CHLORIDE 0.9% FLUSH: 10.0000 mL | INTRAVENOUS | Status: DC | PRN

## 2017-08-31 NOTE — Progress Notes (Signed)
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  She had several paracentesis during that hospitalization.  Presented with complains of uncontrolled pain at home and abdominal distention.  She was discharged on Duragesic patch however her pharmacy has not been able to obtain it for her.  Patient was seen in the emergency department.  She underwent paracentesis and was hospitalized for pain control.  Patient also had nausea and vomiting especially after meals.  She was started on Zofran before each meal.  Patient also has reaccumulated ascites.  Will need another paracentesis prior to discharge.  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  X-ray at presentation did not show any obstruction.  She also reports constipation which could also be contributing (she reports last BM to me last week, but recorded this morning per nursing, will discuss with nursing).  continue lactulose, miralax, senna.  Zofran prn.    Chest Discomfort Started last night, I think this is most likely msk as it seems to be reproducible.  She describes as pressure, no clear aggravating or alleviating factors.  Since this started last night, will check troponin x2 today, follow CXR, and follow EKG.  Trial gi cocktail.  Trial voltaren given suspect MSK.    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  Underwent paracentesis 2/22.  Fluid analysis reviewed.   No growth on fluid cultures.  Previous cultures have been negative for bacteria.  During last hospitalization there was some concern for spontaneous bacterial peritonitis.  Initially it was thought that she was discharged on Doxy for SBP however apparently this was prescribed for Amber Silva rash.  Continue for now.  Ceftriaxone discontinued on 2/24.  She has reaccumulated ascites.  S/p repeat paracentesis on 2/26.  She received chemotherapy during her last hospitalization.  According to patient the next dose is due on the 28th of this month.  She was initially being treated by oncologist in [facility] however is now being treated by Dr. Benay Silva.  Discussed with Dr. Benay Silva yesterday.  Hopefully we can get her home either later today or tomorrow so that she can get her chemotherapy on the 28th.  Cancer related pain Patient was discharged on fentanyl patches during last hospitalization.  She could not fill her fentanyl patch prescription as her pharmacy did not have this medication.  Continue Duragesic patch for now.  Continue IV Dilaudid for breakthrough pain.  Pain is reasonably well controlled.  Case manager has identified another pharmacy which has these patches.  The previous prescription has been destroyed.  We will print out another prescription for her.  Recently diagnosed DVT of the left femoral and common femoral veins She is on anticoagulation with Eliquis which is being continued.  Hyponatremia Probably due to liver metastases and volume overload.  She has had chronic hyponatremia.  Sodium level remains low but stable.  Normocytic anemia Hemoglobin is stable.  No evidence of overt bleeding.  Moderate protein calorie malnutrition Encourage oral  intake as tolerated.  Tachycardia: continue metop.  Worked up at [employer] [facility] [employer] without clear cause prior to last admission, but thought 2/2 malignancy.  Continue to follow.   DVT prophylaxis: eliquis Code Status: full  Family  Communication: none at bedside Disposition Plan: pending improvement in N/V and chest discomfort   Consultants:   Oncology  IR   Procedures:   US guided thearpeutic paracentesis 2/26 with 3 L of bloody fluid  Antimicrobials:  Anti-infectives (From admission, onward)   Start     Dose/Rate Route Frequency Ordered Stop   08/27/17 1600  cefTRIAXone (ROCEPHIN) 2 g in sodium chloride 0.9 % 100 mL IVPB  Status:  Discontinued     2 g 200 mL/hr over 30 Minutes Intravenous Every 24 hours 08/26/17 1635 08/28/17 1306   08/26/17 2200  doxycycline (VIBRA-TABS) tablet 100 mg    Comments:  (follow up with Dr. Benay Silva to determine duration)     100 mg Oral Every 12 hours 08/26/17 1635     08/26/17 1600  cefTRIAXone (ROCEPHIN) 1 g in sodium chloride 0.9 % 100 mL IVPB     1 g 200 mL/hr over 30 Minutes Intravenous  Once 08/26/17 1550 08/26/17 1635   08/26/17 0915  piperacillin-tazobactam (ZOSYN) IVPB 3.375 g  Status:  Discontinued     3.375 g 100 mL/hr over 30 Minutes Intravenous  Once 08/26/17 0909 08/26/17 0912   08/26/17 0915  vancomycin (VANCOCIN) IVPB 1000 mg/200 mL premix  Status:  Discontinued     1,000 mg 200 mL/hr over 60 Minutes Intravenous  Once 08/26/17 0909 08/26/17 0912     Subjective: C/o CP starting last night. Describes as pressure. Episode of N/V this morning, improved after antiemetic.  No BM yet.   Objective: Vitals:   08/30/17 1451 08/30/17 1455 08/30/17 2100 08/31/17 0510  BP: 97/67 92/63 104/79 103/72  Pulse:   (!) 123 (!) 122  Resp:   18 20  Temp:   98 F (36.7 C) 98.8 F (37.1 C)  TempSrc:   Oral Oral  SpO2:   100% 100%    Intake/Output Summary (Last 24 hours) at 08/31/2017 8127 Last data filed at 08/30/2017 1430 Gross per 24 hour  Intake 700 ml  Output -  Net 700 ml   There were no vitals filed for this visit.  Examination:  General exam: Appears calm and comfortable  Respiratory system: Clear to auscultation. Respiratory effort  normal. Cardiovascular system: Tachycardic.  S1 & S2 heard, RRR. No JVD, murmurs, rubs, gallops or clicks. No pedal edema.  Reproducible CP to palpation.  Gastrointestinal system: Abdomen is disteneded, diffusely tender.  Ostomy in place without visibile stool.  Central nervous system: Alert and oriented. No focal neurological deficits. Extremities: L>R LEE edema Skin: No rashes, lesions or ulcers Psychiatry: Judgement and insight appear normal. Mood & affect appropriate.     Data Reviewed: I have personally reviewed following labs and imaging studies  CBC: Recent Labs  Lab 08/26/17 0914 08/27/17 1230 08/28/17 0500 08/30/17 0500  WBC 2.2* 2.8* 3.1* 2.6*  NEUTROABS 1.8  --   --   --   HGB 7.7* 7.5* 7.3* 7.3*  HCT 23.4* 23.1* 23.2* 22.7*  MCV 80.7 82.8 83.2 83.5  PLT 280 282 264 517   Basic Metabolic Panel: Recent Labs  Lab 08/26/17 0914 08/27/17 1230 08/28/17 0500 08/30/17 0500  NA 126* 127* 127* 128*  K 3.4* 3.7 4.0 4.0  CL 91* 96* 95* 97*  CO2 21* 23 22 22  GLUCOSE 96 97 102* 98  BUN 6 <5* 6 7  CREATININE 0.48 0.48 0.52 0.49  CALCIUM 8.2* 7.8* 7.8* 8.0*   GFR: Estimated Creatinine Clearance: 87.4 mL/min (by C-G formula based on SCr of 0.49 mg/dL). Liver Function Tests: Recent Labs  Lab 08/26/17 0914 08/27/17 1230 08/28/17 0500  AST 23 24 25   ALT 18 17 15   ALKPHOS 156* 149* 148*  BILITOT 0.5 0.6 0.5  PROT 5.7* 5.3* 4.7*  ALBUMIN 2.1* 1.8* 1.6*   Recent Labs  Lab 08/26/17 0914  LIPASE 29   No results for input(s): AMMONIA in the last 168 hours. Coagulation Profile: No results for input(s): INR, PROTIME in the last 168 hours. Cardiac Enzymes: No results for input(s): CKTOTAL, CKMB, CKMBINDEX, TROPONINI in the last 168 hours. BNP (last 3 results) No results for input(s): PROBNP in the last 8760 hours. HbA1C: No results for input(s): HGBA1C in the last 72 hours. CBG: No results for input(s): GLUCAP in the last 168 hours. Lipid Profile: No results  for input(s): CHOL, HDL, LDLCALC, TRIG, CHOLHDL, LDLDIRECT in the last 72 hours. Thyroid Function Tests: No results for input(s): TSH, T4TOTAL, FREET4, T3FREE, THYROIDAB in the last 72 hours. Anemia Panel: No results for input(s): VITAMINB12, FOLATE, FERRITIN, TIBC, IRON, RETICCTPCT in the last 72 hours. Sepsis Labs: Recent Labs  Lab 08/26/17 7989  LATICACIDVEN 0.91    Recent Results (from the past 240 hour(s))  Blood Culture (routine x 2)     Status: None (Preliminary result)   Collection Time: 08/26/17  9:14 AM  Result Value Ref Range Status   Specimen Description   Final    BLOOD PORTA CATH Performed at [facility] [address] [place], [place] [address]    Special Requests   Final    BOTTLES DRAWN AEROBIC AND ANAEROBIC Blood Culture adequate volume Performed at [facility] [address] [place], [place] [address]    Culture   Final    NO GROWTH 4 DAYS Performed at [place] [facility], [place] [address], [place], [place] [address]    Report Status PENDING  Incomplete  Body fluid culture     Status: None   Collection Time: 08/26/17 12:08 PM  Result Value Ref Range Status   Specimen Description   Final    PERITONEAL Performed at [facility] [address], [place], [place] [address]    Special Requests   Final    NONE Performed at [facility], [place] [address], [place], [place] [address]    Gram Stain   Final    RARE WBC PRESENT, PREDOMINANTLY MONONUCLEAR NO ORGANISMS SEEN    Culture   Final    NO GROWTH 3 DAYS Performed at [facility], [place] [address], [place], [place] [address]    Report Status 08/29/2017 FINAL  Final  Blood Culture (routine x 2)     Status: None (Preliminary result)   Collection Time: 08/26/17  6:17 PM  Result Value Ref Range Status   Specimen Description   Final    BLOOD RIGHT ARM Performed at [facility] [address],  [place], [place] [address]    Special Requests   Final    BOTTLES DRAWN AEROBIC AND ANAEROBIC Blood Culture adequate volume Performed at [facility] [address], [place],  [address]    Culture   Final    NO GROWTH 4 DAYS Performed at [facility],  [address], [place], [place] [address]    Report Status PENDING  Incomplete         Radiology Studies: US Paracentesis  Result Date: 08/30/2017 INDICATION: Patient with history of metastatic rectal carcinoma with recurrent malignant ascites. Request made for therapeutic paracentesis. EXAM: ULTRASOUND GUIDED THERAPEUTIC PARACENTESIS MEDICATIONS: None. COMPLICATIONS: None immediate. PROCEDURE: Informed written consent was obtained from the patient after Keats Kingry discussion of the risks, benefits and alternatives to treatment. Loana Salvaggio timeout was performed prior to the initiation of the procedure. Initial ultrasound scanning demonstrates Tomie Spizzirri moderate amount of ascites within the left mid to lower abdominal quadrant. The left mid to lower abdomen was prepped and draped in the usual sterile fashion. 1% lidocaine was used for local anesthesia. Following this, Klayton Monie Yueh catheter was introduced. An ultrasound image was saved for documentation purposes. The paracentesis was performed. The catheter was removed and Korry Dalgleish dressing was applied. The patient tolerated the procedure well without immediate post procedural complication. FINDINGS: Sherra Kimmons total of approximately 3 liters of bloody fluid was removed. IMPRESSION: Successful ultrasound-guided therapeutic paracentesis yielding 3 liters of peritoneal fluid. Read by: Rowe Robert, PA-C Electronically Signed   By: Sandi Mariscal M.D.   On: 08/30/2017 15:31        Scheduled Meds: . apixaban  5 mg Oral BID  . doxycycline  100 mg Oral Q12H  . fentaNYL  150 mcg Transdermal Q72H  . lactulose  10 g Oral BID  . metoprolol tartrate  12.5 mg Oral BID  . ondansetron  4 mg Oral TID AC  . polyethylene  glycol  17 g Oral Daily  . potassium chloride  10 mEq Oral Daily  . senna  1 tablet Oral BID  . sodium chloride flush  3 mL Intravenous Q12H   Continuous Infusions: . sodium chloride       LOS: 4 days    Time spent: over 30 min    Fayrene Helper, MD [facility] Pager [phone]  If 7PM-7AM, please contact night-coverage [email or web address] Password [facility] 08/31/2017, 8:07 AM

## 2017-09-01 ENCOUNTER — Other Ambulatory Visit: Payer: Medicaid Other

## 2017-09-01 ENCOUNTER — Inpatient Hospital Stay (HOSPITAL_COMMUNITY): Payer: Medicaid Other

## 2017-09-01 ENCOUNTER — Ambulatory Visit: Payer: Medicaid Other

## 2017-09-01 ENCOUNTER — Ambulatory Visit: Payer: Medicaid Other | Admitting: Oncology

## 2017-09-01 LAB — HEMOGLOBIN AND HEMATOCRIT, BLOOD
HCT: 22.8 % — ABNORMAL LOW (ref 36.0–46.0)
HEMOGLOBIN: 7.1 g/dL — AB (ref 12.0–15.0)

## 2017-09-01 LAB — CBC
HEMATOCRIT: 22.1 % — AB (ref 36.0–46.0)
Hemoglobin: 7 g/dL — ABNORMAL LOW (ref 12.0–15.0)
MCH: 26.5 pg (ref 26.0–34.0)
MCHC: 31.7 g/dL (ref 30.0–36.0)
MCV: 83.7 fL (ref 78.0–100.0)
Platelets: 315 10*3/uL (ref 150–400)
RBC: 2.64 MIL/uL — ABNORMAL LOW (ref 3.87–5.11)
RDW: 17.4 % — AB (ref 11.5–15.5)
WBC: 3.5 10*3/uL — ABNORMAL LOW (ref 4.0–10.5)

## 2017-09-01 LAB — COMPREHENSIVE METABOLIC PANEL
ALT: 12 U/L — ABNORMAL LOW (ref 14–54)
ANION GAP: 6 (ref 5–15)
AST: 24 U/L (ref 15–41)
Albumin: 1.4 g/dL — ABNORMAL LOW (ref 3.5–5.0)
Alkaline Phosphatase: 151 U/L — ABNORMAL HIGH (ref 38–126)
BILIRUBIN TOTAL: 0.3 mg/dL (ref 0.3–1.2)
BUN: 8 mg/dL (ref 6–20)
CO2: 23 mmol/L (ref 22–32)
Calcium: 7.4 mg/dL — ABNORMAL LOW (ref 8.9–10.3)
Chloride: 100 mmol/L — ABNORMAL LOW (ref 101–111)
Creatinine, Ser: 0.52 mg/dL (ref 0.44–1.00)
GFR calc Af Amer: >60 mL/min (ref >60)
GFR calc non Af Amer: >60 mL/min (ref >60)
Glucose, Bld: 91 mg/dL (ref 65–99)
POTASSIUM: 4.1 mmol/L (ref 3.5–5.1)
Sodium: 129 mmol/L — ABNORMAL LOW (ref 135–145)
TOTAL PROTEIN: 4.9 g/dL — AB (ref 6.5–8.1)

## 2017-09-01 LAB — MAGNESIUM: Magnesium: 1.5 mg/dL — ABNORMAL LOW (ref 1.7–2.4)

## 2017-09-01 MED ORDER — IOPAMIDOL (ISOVUE-300) INJECTION 61%: 15.0000 mL | Freq: Two times a day (BID) | INTRAVENOUS | Status: DC | PRN

## 2017-09-01 MED ORDER — POTASSIUM CHLORIDE CRYS ER 10 MEQ PO TBCR
10.0000 meq | EXTENDED_RELEASE_TABLET | Freq: Every day | ORAL | Status: DC
  Administered 2017-09-01 – 2017-09-09 (×9): 10 meq via ORAL
  Filled 2017-09-01 (×9): qty 1

## 2017-09-01 MED ORDER — MAGNESIUM CITRATE PO SOLN
0.5000 | Freq: Once | ORAL | Status: DC
  Filled 2017-09-01: qty 296

## 2017-09-01 MED ORDER — MAGNESIUM SULFATE 2 GM/50ML IV SOLN
2.0000 g | Freq: Once | INTRAVENOUS | Status: AC
  Administered 2017-09-01: 2 g via INTRAVENOUS
  Filled 2017-09-01: qty 50

## 2017-09-01 MED ORDER — IOPAMIDOL (ISOVUE-300) INJECTION 61%
100.0000 mL | Freq: Once | INTRAVENOUS | Status: AC | PRN
  Administered 2017-09-01: 100 mL via INTRAVENOUS

## 2017-09-01 MED ORDER — MAGNESIUM SULFATE 4 GM/100ML IV SOLN: 4.0000 g | Freq: Once | INTRAVENOUS | Status: DC

## 2017-09-01 MED ORDER — IOPAMIDOL (ISOVUE-300) INJECTION 61%
INTRAVENOUS | Status: AC
  Filled 2017-09-01: qty 30

## 2017-09-01 NOTE — Progress Notes (Addendum)
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  She had several paracentesis during that hospitalization.  Presented with complains of uncontrolled pain at home and abdominal distention.  She was discharged on Duragesic patch however her pharmacy has not been able to obtain it for her.  Patient was seen in the emergency department.  She underwent paracentesis and was hospitalized for pain control.  Patient also had nausea and vomiting especially after meals.  She was started on Zofran before each meal.  Patient also has reaccumulated ascites.  Will need another paracentesis prior to discharge.  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  X-ray at presentation did not show any obstruction.  She also reports constipation which could also be contributing.  continue lactulose, miralax, senna.  Zofran prn. Given persistence of sx, CT abdomen/pelvis today    Chest Discomfort Negative troponins.  Reproducible on exam and improved with voltaren.  Suggests msk etiology.  Continue to follow.   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  Underwent paracentesis 2/22.  Fluid analysis reviewed.  No growth on fluid cultures.  Previous cultures have been negative for bacteria.  During last hospitalization there was some concern for spontaneous bacterial peritonitis.  Initially it was thought that she was discharged on Doxy for SBP  however apparently this was prescribed for Corrie Brannen rash.  Continue for now.  Ceftriaxone discontinued on 2/24.  She has reaccumulated ascites.  S/p repeat paracentesis on 2/26.  She received chemotherapy during her last hospitalization.  As she's required continued hospitalization for nausea and vomiting and poor PO, will plan on chemotherapy as inpatient.  Discussed with Dr. Benay Spice.  She was initially being treated by oncologist in [facility] however is now being treated by Dr. Benay Spice.   Cancer related pain Patient was discharged on fentanyl patches during last hospitalization.  She could not fill her fentanyl patch prescription as her pharmacy did not have this medication.   Continue Duragesic patch for now. MSIR 15 mg q2 prn Dilaudid for breakthrough pain  Case manager has identified another pharmacy which has these patches.  The previous prescription has been destroyed.  We will print out another prescription for her.  Recently diagnosed DVT of the left femoral and common femoral veins She is on anticoagulation with Eliquis which is being continued.  Hyponatremia Probably due to liver metastases and volume overload.  She has had chronic hyponatremia.  Sodium level remains low but stable.  Normocytic anemia Hb 7 today, follow repeat this afternoon  Moderate protein calorie malnutrition Encourage oral intake as tolerated.  Tachycardia: continue metop.  Worked up at [employer] without clear cause prior to last admission, but thought 2/2 malignancy.  Continue to follow.   DVT prophylaxis: eliquis Code Status: full  Family Communication: none at bedside Disposition Plan: pending improvement in N/V and chest discomfort   Consultants:   Oncology  IR   Procedures:   US guided thearpeutic paracentesis 2/26 with  3 L of bloody fluid  Antimicrobials:  Anti-infectives (From admission, onward)   Start     Dose/Rate Route Frequency Ordered Stop   08/27/17 1600   cefTRIAXone (ROCEPHIN) 2 g in sodium chloride 0.9 % 100 mL IVPB  Status:  Discontinued     2 g 200 mL/hr over 30 Minutes Intravenous Every 24 hours 08/26/17 1635 08/28/17 1306   08/26/17 2200  doxycycline (VIBRA-TABS) tablet 100 mg    Comments:  (follow up with Dr. Benay Spice to determine duration)     100 mg Oral Every 12 hours 08/26/17 1635     08/26/17 1600  cefTRIAXone (ROCEPHIN) 1 g in sodium chloride 0.9 % 100 mL IVPB     1 g 200 mL/hr over 30 Minutes Intravenous  Once 08/26/17 1550 08/26/17 1635   08/26/17 0915  piperacillin-tazobactam (ZOSYN) IVPB 3.375 g  Status:  Discontinued     3.375 g 100 mL/hr over 30 Minutes Intravenous  Once 08/26/17 0909 08/26/17 0912   08/26/17 0915  vancomycin (VANCOCIN) IVPB 1000 mg/200 mL premix  Status:  Discontinued     1,000 mg 200 mL/hr over 60 Minutes Intravenous  Once 08/26/17 0909 08/26/17 0912     Subjective: Some persistent n/v. No BM yet.  Objective: Vitals:   08/31/17 0510 08/31/17 1405 08/31/17 2110 09/01/17 0340  BP: 103/72 (!) 155/66 115/78 99/69  Pulse: (!) 122 88 94 100  Resp: 20 16 14 12   Temp: 98.8 F (37.1 C) 98.2 F (36.8 C) 98.9 F (37.2 C) 99 F (37.2 C)  TempSrc: Oral Oral Oral Oral  SpO2: 100% 100% 100% 100%    Intake/Output Summary (Last 24 hours) at 09/01/2017 1653 Last data filed at 09/01/2017 0340 Gross per 24 hour  Intake -  Output 40 ml  Net -40 ml   There were no vitals filed for this visit.  Examination:  General: No acute distress. Cardiovascular: Heart sounds show Raymone Pembroke regular rate, and rhythm. No gallops or rubs. No murmurs. No JVD. Lungs: Clear to auscultation bilaterally with good air movement. No rales, rhonchi or wheezes. Abdomen: Soft, diffusely tender, distended.  Ostomy with no output. Neurological: Alert and oriented 3. Moves all extremities 4. Cranial nerves II through XII grossly intact. Skin: Warm and dry. No rashes or lesions. Extremities: No clubbing or cyanosis. L>R LEE Psychiatric:  Mood and affect are normal. Insight and judgment are appropriate.   Data Reviewed: I have personally reviewed following labs and imaging studies  CBC: Recent Labs  Lab 08/26/17 0914 08/27/17 1230 08/28/17 0500 08/30/17 0500 09/01/17 0323  WBC 2.2* 2.8* 3.1* 2.6* 3.5*  NEUTROABS 1.8  --   --  1.7  --   HGB 7.7* 7.5* 7.3* 7.3* 7.0*  HCT 23.4* 23.1* 23.2* 22.7* 22.1*  MCV 80.7 82.8 83.2 83.5 83.7  PLT 280 282 264 368 846   Basic Metabolic Panel: Recent Labs  Lab 08/26/17 0914 08/27/17 1230 08/28/17 0500 08/30/17 0500 09/01/17 0323  NA 126* 127* 127* 128* 129*  K 3.4* 3.7 4.0 4.0 4.1  CL 91* 96* 95* 97* 100*  CO2 21* 23 22 22 23   GLUCOSE 96 97 102* 98 91  BUN 6 <5* 6 7 8   CREATININE 0.48 0.48 0.52 0.49 0.52  CALCIUM 8.2* 7.8* 7.8* 8.0* 7.4*  MG  --   --   --   --  1.5*   GFR: CrCl cannot be calculated (Unknown ideal weight.). Liver Function Tests: Recent Labs  Lab 08/26/17 0914 08/27/17 1230 08/28/17 0500  09/01/17 0323  AST 23 24 25 24   ALT 18 17 15  12*  ALKPHOS 156* 149* 148* 151*  BILITOT 0.5 0.6 0.5 0.3  PROT 5.7* 5.3* 4.7* 4.9*  ALBUMIN 2.1* 1.8* 1.6* 1.4*   Recent Labs  Lab 08/26/17 0914  LIPASE 29   No results for input(s): AMMONIA in the last 168 hours. Coagulation Profile: No results for input(s): INR, PROTIME in the last 168 hours. Cardiac Enzymes: Recent Labs  Lab 08/31/17 0945 08/31/17 1730  TROPONINI <0.03 <0.03   BNP (last 3 results) No results for input(s): PROBNP in the last 8760 hours. HbA1C: No results for input(s): HGBA1C in the last 72 hours. CBG: No results for input(s): GLUCAP in the last 168 hours. Lipid Profile: No results for input(s): CHOL, HDL, LDLCALC, TRIG, CHOLHDL, LDLDIRECT in the last 72 hours. Thyroid Function Tests: No results for input(s): TSH, T4TOTAL, FREET4, T3FREE, THYROIDAB in the last 72 hours. Anemia Panel: No results for input(s): VITAMINB12, FOLATE, FERRITIN, TIBC, IRON, RETICCTPCT in the last 72  hours. Sepsis Labs: Recent Labs  Lab 08/26/17 1324  LATICACIDVEN 0.91    Recent Results (from the past 240 hour(s))  Blood Culture (routine x 2)     Status: None   Collection Time: 08/26/17  9:14 AM  Result Value Ref Range Status   Specimen Description   Final    BLOOD PORTA CATH Performed at [facility] [address], [place], [place] [address]    Special Requests   Final    BOTTLES DRAWN AEROBIC AND ANAEROBIC Blood Culture adequate volume Performed at [facility] [address], [place], [place] [address]    Culture   Final    NO GROWTH 5 DAYS Performed at [facility], [place] [address], [place], [place] [address]    Report Status 08/31/2017 FINAL  Final  Body fluid culture     Status: None   Collection Time: 08/26/17 12:08 PM  Result Value Ref Range Status   Specimen Description   Final    PERITONEAL Performed at [facility] [address], [place], [place] [address]    Special Requests   Final    NONE Performed at [facility], [place] [address] [place], [place] [address]    Gram Stain   Final    RARE WBC PRESENT, PREDOMINANTLY MONONUCLEAR NO ORGANISMS SEEN    Culture   Final    NO GROWTH 3 DAYS Performed at [facility], [place] [address] [place], [place] [address]    Report Status 08/29/2017 FINAL  Final  Blood Culture (routine x 2)     Status: None   Collection Time: 08/26/17  6:17 PM  Result Value Ref Range Status   Specimen Description   Final    BLOOD RIGHT ARM Performed at [facility] [address] [place], [place] [address]    Special Requests   Final    BOTTLES DRAWN AEROBIC AND ANAEROBIC Blood Culture adequate volume Performed at [facility] [address], [place], [place] [address]    Culture   Final    NO GROWTH 5 DAYS Performed at [facility], [place] [address], [place],  [address]    Report Status  08/31/2017 FINAL  Final         Radiology Studies: Ct Abdomen Pelvis W Contrast  Result Date: 09/01/2017 CLINICAL DATA:  Patient with abdominal pain and distention. History of colon  cancer. EXAM: CT ABDOMEN AND PELVIS WITH CONTRAST TECHNIQUE: Multidetector CT imaging of the abdomen and pelvis was performed using the standard protocol following bolus administration of intravenous contrast. CONTRAST:  126mL ISOVUE-300 IOPAMIDOL (ISOVUE-300) INJECTION 61% COMPARISON:  CT abdomen pelvis 08/11/2017 FINDINGS: Lower chest: Normal heart size. Small pericardial effusion. Moderate layering bilateral pleural effusions. Dependent atelectasis within the bilateral lower lobes. Hepatobiliary: Multiple low-attenuation lesions are demonstrated within the liver compatible with hepatic metastatic disease. Interval increase in size of right hepatic lobe metastatic lesion measuring 4.1 x 5.1 cm (image 20; series 2), previously 4.7 x 3.7 cm. Interval increase in size of Delesha Pohlman patent dome lesion measuring 3.5 x 3.4 cm (image 18; series 2), previously 3.2 x 3.1 cm. Interval increase in size of left hepatic lobe lesion measuring 1.2 cm (image 29; series 2), previously 1.0 cm. Gallbladder is unremarkable. Pancreas: Unremarkable Spleen: Unremarkable Adrenals/Urinary Tract: Normal adrenal glands. Kidneys enhance symmetrically with contrast. Urinary bladder is unremarkable. Stomach/Bowel: Re demonstrated necrotic rectal mass. Re demonstrated surrounding dystrophic calcification, potentially reflective of post treatment change. Re demonstrated tract extending towards the perineum/vagina. Centralization of small bowel loops. Normal morphology of the stomach. No evidence for small bowel obstruction. Left lower quadrant colostomy demonstrated. Vascular/Lymphatic: Normal caliber abdominal aorta. No retroperitoneal lymphadenopathy. Filling defect within the left femoral vein compatible with thrombosis (image 93; series 2). Reproductive: Uterus  and adnexal structures are unremarkable. Other: Re demonstrated massive ascites. Interval increase in extensive omental caking and peritoneal metastatic disease. Bulky mass within the left upper quadrant measures 13 x 8 cm (image 21; series 2), previously 13 x 4.7 cm. Increased peritoneal metastatic disease throughout the right hemiabdomen. Re demonstrated metastatic disease about the colostomy within the left lower abdomen (image 60; series 2). Musculoskeletal: No aggressive or acute appearing osseous lesions. Soft tissue anasarca. IMPRESSION: 1. Re demonstrated partially necrotic large rectal mass. 2. Interval increase in extensive omental and peritoneal metastatic disease with large volume ascites. 3. Interval increase in size of hepatic metastatic disease. 4. New moderate layering bilateral pleural effusions. 5. Extensive DVT within the left femoral vein. Electronically Signed   By: Lovey Newcomer M.D.   On: 09/01/2017 16:01   Dg Chest Port 1 View  Result Date: 08/31/2017 CLINICAL DATA:  Chest pain.  History of colonic malignancy. EXAM: PORTABLE CHEST 1 VIEW COMPARISON:  Portable chest x-ray of August 26, 2017 FINDINGS: The lungs are less well inflated today. There is Bonniejean Piano smaller moderate-sized right pleural effusion. There is no alveolar infiltrate. There is no pneumothorax. The heart is top-normal in size. The pulmonary vascularity is not engorged. The power port catheter tip projects over the distal third of the SVC. The bowel gas pattern in the upper abdomen is unremarkable. The bony thorax exhibits no acute abnormality. IMPRESSION: Smaller moderate-sized right pleural effusion more conspicuous than on the earlier study. Bilateral hypoinflation. Electronically Signed   By: David  Martinique M.D.   On: 08/31/2017 11:03        Scheduled Meds: . apixaban  5 mg Oral BID  . diclofenac sodium  2 g Topical QID  . doxycycline  100 mg Oral Q12H  . fentaNYL  150 mcg Transdermal Q72H  . iopamidol      .  lactulose  10 g Oral BID  . metoprolol tartrate  12.5 mg Oral BID  . ondansetron  4 mg Oral TID AC  . polyethylene glycol  17 g Oral BID  . potassium chloride  10 mEq Oral Daily  . senna  1 tablet Oral BID  . sodium chloride flush  10-40 mL Intracatheter Q12H  . sodium chloride flush  3 mL Intravenous Q12H   Continuous Infusions: . sodium chloride       LOS: 5 days    Time spent: over 30 min    Fayrene Helper, MD [facility] Pager [phone]  If 7PM-7AM, please contact night-coverage [email or web address] Password [facility] 09/01/2017, 4:53 PM

## 2017-09-01 NOTE — Progress Notes (Signed)
IP PROGRESS NOTE  Subjective:   Amber Silva reports no bowel movement, but she is passing gas into the ostomy bag.  She continues to have abdominal pain and nausea.  She would like to proceed with cycle 2 FOLFIRI/panitumumab. Objective: Vital signs in last 24 hours: Blood pressure 99/69, pulse 100, temperature 99 F (37.2 C), temperature source Oral, resp. rate 12, SpO2 100 %.  Intake/Output from previous day: 02/27 0701 - 02/28 0700 In: 720 [P.O.:720] Out: 40 [Stool:40]  Physical Exam:  HEENT: No thrush Lungs: Decreased breath sounds at the right lower chest, no respiratory distress Cardiac: Regular rate and rhythm Abdomen: Distended today, left lower quadrant colostomy, firm fullness in the upper and lower abdomen with associated tenderness Extremities: No leg edema Skin: No rash, palms and soles with dryness.  No erythema or skin breakdown.  Portacath/PICC-without erythema  Lab Results: Recent Labs    08/30/17 0500 09/01/17 0323  WBC 2.6* 3.5*  HGB 7.3* 7.0*  HCT 22.7* 22.1*  PLT 368 315   ANC 1.8 on 08/26/2017 BMET Recent Labs    08/30/17 0500 09/01/17 0323  NA 128* 129*  K 4.0 4.1  CL 97* 100*  CO2 22 23  GLUCOSE 98 91  BUN 7 8  CREATININE 0.49 0.52  CALCIUM 8.0* 7.4*    Studies/Results: Ct Abdomen Pelvis W Contrast  Result Date: 09/01/2017 CLINICAL DATA:  Patient with abdominal pain and distention. History of colon cancer. EXAM: CT ABDOMEN AND PELVIS WITH CONTRAST TECHNIQUE: Multidetector CT imaging of the abdomen and pelvis was performed using the standard protocol following bolus administration of intravenous contrast. CONTRAST:  146m ISOVUE-300 IOPAMIDOL (ISOVUE-300) INJECTION 61% COMPARISON:  CT abdomen pelvis 08/11/2017 FINDINGS: Lower chest: Normal heart size. Small pericardial effusion. Moderate layering bilateral pleural effusions. Dependent atelectasis within the bilateral lower lobes. Hepatobiliary: Multiple low-attenuation lesions are demonstrated  within the liver compatible with hepatic metastatic disease. Interval increase in size of right hepatic lobe metastatic lesion measuring 4.1 x 5.1 cm (image 20; series 2), previously 4.7 x 3.7 cm. Interval increase in size of a patent dome lesion measuring 3.5 x 3.4 cm (image 18; series 2), previously 3.2 x 3.1 cm. Interval increase in size of left hepatic lobe lesion measuring 1.2 cm (image 29; series 2), previously 1.0 cm. Gallbladder is unremarkable. Pancreas: Unremarkable Spleen: Unremarkable Adrenals/Urinary Tract: Normal adrenal glands. Kidneys enhance symmetrically with contrast. Urinary bladder is unremarkable. Stomach/Bowel: Re demonstrated necrotic rectal mass. Re demonstrated surrounding dystrophic calcification, potentially reflective of post treatment change. Re demonstrated tract extending towards the perineum/vagina. Centralization of small bowel loops. Normal morphology of the stomach. No evidence for small bowel obstruction. Left lower quadrant colostomy demonstrated. Vascular/Lymphatic: Normal caliber abdominal aorta. No retroperitoneal lymphadenopathy. Filling defect within the left femoral vein compatible with thrombosis (image 93; series 2). Reproductive: Uterus and adnexal structures are unremarkable. Other: Re demonstrated massive ascites. Interval increase in extensive omental caking and peritoneal metastatic disease. Bulky mass within the left upper quadrant measures 13 x 8 cm (image 21; series 2), previously 13 x 4.7 cm. Increased peritoneal metastatic disease throughout the right hemiabdomen. Re demonstrated metastatic disease about the colostomy within the left lower abdomen (image 60; series 2). Musculoskeletal: No aggressive or acute appearing osseous lesions. Soft tissue anasarca. IMPRESSION: 1. Re demonstrated partially necrotic large rectal mass. 2. Interval increase in extensive omental and peritoneal metastatic disease with large volume ascites. 3. Interval increase in size of  hepatic metastatic disease. 4. New moderate layering bilateral pleural effusions. 5. Extensive  DVT within the left femoral vein. Electronically Signed   By: Lovey Newcomer M.D.   On: 09/01/2017 16:01   Dg Chest Port 1 View  Result Date: 08/31/2017 CLINICAL DATA:  Chest pain.  History of colonic malignancy. EXAM: PORTABLE CHEST 1 VIEW COMPARISON:  Portable chest x-ray of August 26, 2017 FINDINGS: The lungs are less well inflated today. There is a smaller moderate-sized right pleural effusion. There is no alveolar infiltrate. There is no pneumothorax. The heart is top-normal in size. The pulmonary vascularity is not engorged. The power port catheter tip projects over the distal third of the SVC. The bowel gas pattern in the upper abdomen is unremarkable. The bony thorax exhibits no acute abnormality. IMPRESSION: Smaller moderate-sized right pleural effusion more conspicuous than on the earlier study. Bilateral hypoinflation. Electronically Signed   By: David  Martinique M.D.   On: 08/31/2017 11:03    Medications: I have reviewed the patient's current medications.  Assessment/Plan: 1. Rectal cancer July 2018  Colonoscopy 01/25/2017-biopsy showedinvasive moderately differentiated adenocarcinoma with mucinous features.   CT chest/abdomen/pelvis 02/11/2017-very large ulcerated rectal mass with severe wall thickening over the distal 11.2 cm of the rectum extending to the anus with surrounding perirectal adenopathy and pelvic sidewall adenopathy.At least 2 metastatic lesions in the right hepatic lobe.   MRI of the pelvis 02/14/2017-T4b,N2bulky rectal mass with deep infiltrative invasion of the bilateral pelvic sidewalls, presacral space, mesorectal fascia, distal branches of the internal iliac vasculature and sacral plexus. There was abnormal signal intensity along both sciatic nerves possibly representing perineural extension. The mass was noted to approximate the posterior vaginal wall without definite  invasion.   PET scan 02/15/2017-colorectal malignancy with mesorectal fascia infiltration and 2 FDG avid hepatic metastases. Regional lymphadenopathy found on prior imaging was not FDG avid on the PET scan.   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  Foundation 1-KRAS/NRASwild-type; microsatellite stable; tumor mutational burden 3; BRCA1 rearrangement intron 2  4 cycles of FOLFIRINOX9/11/2016 through 04/27/2017.   CT abdomen/pelvis 05/06/2017-large rectal mass and known hepatic metastases similar to recent comparison.  Status postpartial course of radiation/Xeloda.   CTs 07/12/2017-large necrotic invasive rectal mass; increased size of known liver metastatic lesions; interval increase in size of peritoneal implants; new development of moderate volume abdominal ascites; gas again present within the anterior rectal wall closely opposing the gas-filled vagina. Rectovaginal fistula not excluded.   Admitted to [facility] 08/12/2017 with abdominal pain and swelling  CT abdomen/pelvis 08/11/2017-large rectal mass that appeared necrotic and with discrete tract extending toward the vagina/perineum; interval development of massive ascites; diffuse caking of the omentum; descending colostomy bulging due to peritoneal metastases; hepatic metastases measuring up to 4.7 cm in the upper right liver; large thrombus in the left femoral and common femoral veins.  Cycle 1 FOLFIRI/panitumumab 08/18/2017  CT 08/24/2017-enlargement of liver lesions and peritoneal tumor burden 2. Left lower extremity DVT. On Eliquis. 3. Ascitessecondary to #1.  Status post repeat paracentesis procedures 4. Tachycardia-likely secondary to anemia, intravascular depletion, critical illness 5. Anemia secondary to chronic disease, chemotherapy/radiation, and phlebotomy.  Red cell transfusion 08/19/2017 6. Leukopenia secondary to chemotherapy 7. Pain secondary to metastatic rectal cancer   Ms. Campus appears  unchanged.  She continues to be symptomatic from carcinomatosis.  She has pain and nausea.  The CT does not reveal evidence of a bowel obstruction.  There is enlargement of liver lesions on the CT today compared to a CT from 08/11/2017.  I cannot appreciate new lesions and there was a  week interval between the baseline CT and initiation of FOLFIRI/panitumumab.  Systemic treatment options are limited beyond FOLFIRI/panitumumab.  I recommend proceeding with an additional cycle of FOLFIRI/panitumumab.  If there is not clinical improvement following the cycle I will recommend hospice care.    Recommendations: 1.  Continue Duragesic and MSIR for pain 2.  Agree with magnesium citrate, increase lactulose dose if no bowel movement today 3.  Cycle 2 FOLFIRI/Panitumumab 09/02/2017  I will see her in the a.m. on 09/02/2017.  I am available to discuss the case with her family.   LOS: 5 days   Betsy Coder, MD   09/01/2017, 5:35 PM

## 2017-09-02 ENCOUNTER — Other Ambulatory Visit: Payer: Self-pay

## 2017-09-02 DIAGNOSIS — L8990 Pressure ulcer of unspecified site, unspecified stage: Secondary | ICD-10-CM

## 2017-09-02 LAB — COMPREHENSIVE METABOLIC PANEL
ALK PHOS: 175 U/L — AB (ref 38–126)
ALT: 12 U/L — ABNORMAL LOW (ref 14–54)
AST: 29 U/L (ref 15–41)
Albumin: 1.6 g/dL — ABNORMAL LOW (ref 3.5–5.0)
Anion gap: 11 (ref 5–15)
BILIRUBIN TOTAL: 0.4 mg/dL (ref 0.3–1.2)
BUN: 10 mg/dL (ref 6–20)
CO2: 21 mmol/L — ABNORMAL LOW (ref 22–32)
Calcium: 7.8 mg/dL — ABNORMAL LOW (ref 8.9–10.3)
Chloride: 98 mmol/L — ABNORMAL LOW (ref 101–111)
Creatinine, Ser: 0.63 mg/dL (ref 0.44–1.00)
GFR calc Af Amer: >60 mL/min (ref >60)
Glucose, Bld: 110 mg/dL — ABNORMAL HIGH (ref 65–99)
Potassium: 4.8 mmol/L (ref 3.5–5.1)
Sodium: 130 mmol/L — ABNORMAL LOW (ref 135–145)
TOTAL PROTEIN: 5 g/dL — AB (ref 6.5–8.1)

## 2017-09-02 LAB — CBC
HCT: 23.2 % — ABNORMAL LOW (ref 36.0–46.0)
Hemoglobin: 7.2 g/dL — ABNORMAL LOW (ref 12.0–15.0)
MCH: 26 pg (ref 26.0–34.0)
MCHC: 31 g/dL (ref 30.0–36.0)
MCV: 83.8 fL (ref 78.0–100.0)
PLATELETS: 403 10*3/uL — AB (ref 150–400)
RBC: 2.77 MIL/uL — ABNORMAL LOW (ref 3.87–5.11)
RDW: 17.6 % — AB (ref 11.5–15.5)
WBC: 3.9 10*3/uL — AB (ref 4.0–10.5)

## 2017-09-02 LAB — DIFFERENTIAL
BASOS ABS: 0 10*3/uL (ref 0.0–0.1)
Basophils Relative: 0 %
EOS ABS: 0 10*3/uL (ref 0.0–0.7)
Eosinophils Relative: 1 %
Lymphocytes Relative: 9 %
Lymphs Abs: 0.4 10*3/uL — ABNORMAL LOW (ref 0.7–4.0)
MONOS PCT: 28 %
Monocytes Absolute: 1.1 10*3/uL — ABNORMAL HIGH (ref 0.1–1.0)
NEUTROS PCT: 62 %
Neutro Abs: 2.5 10*3/uL (ref 1.7–7.7)

## 2017-09-02 LAB — BASIC METABOLIC PANEL
Anion gap: 8 (ref 5–15)
BUN: 10 mg/dL (ref 6–20)
CALCIUM: 7.9 mg/dL — AB (ref 8.9–10.3)
CO2: 23 mmol/L (ref 22–32)
CREATININE: 0.62 mg/dL (ref 0.44–1.00)
Chloride: 96 mmol/L — ABNORMAL LOW (ref 101–111)
GFR calc non Af Amer: >60 mL/min (ref >60)
Glucose, Bld: 127 mg/dL — ABNORMAL HIGH (ref 65–99)
Potassium: 4.4 mmol/L (ref 3.5–5.1)
SODIUM: 127 mmol/L — AB (ref 135–145)

## 2017-09-02 LAB — MAGNESIUM: Magnesium: 2 mg/dL (ref 1.7–2.4)

## 2017-09-02 MED ORDER — LIDOCAINE-PRILOCAINE 2.5-2.5 % EX CREA: TOPICAL_CREAM | Freq: Once | CUTANEOUS | Status: DC

## 2017-09-02 MED ORDER — DEXTROSE 5 % IV SOLN
200.0000 mg/m2 | Freq: Once | INTRAVENOUS | Status: AC
  Administered 2017-09-02: 330 mg via INTRAVENOUS
  Filled 2017-09-02 (×2): qty 16.5

## 2017-09-02 MED ORDER — ATROPINE SULFATE 1 MG/ML IJ SOLN
0.5000 mg | Freq: Once | INTRAMUSCULAR | Status: DC | PRN
  Filled 2017-09-02: qty 0.5

## 2017-09-02 MED ORDER — IRINOTECAN HCL CHEMO INJECTION 100 MG/5ML
125.0000 mg/m2 | Freq: Once | INTRAVENOUS | Status: AC
  Administered 2017-09-02: 200 mg via INTRAVENOUS
  Filled 2017-09-02: qty 10

## 2017-09-02 MED ORDER — SODIUM CHLORIDE 0.9% FLUSH: 10.0000 mL | INTRAVENOUS | Status: DC | PRN

## 2017-09-02 MED ORDER — MORPHINE SULFATE 15 MG PO TABS
15.0000 mg | ORAL_TABLET | ORAL | Status: DC | PRN
  Administered 2017-09-03: 30 mg via ORAL
  Filled 2017-09-02: qty 2

## 2017-09-02 MED ORDER — SODIUM CHLORIDE 0.9% FLUSH: 3.0000 mL | INTRAVENOUS | Status: DC | PRN

## 2017-09-02 MED ORDER — PALONOSETRON HCL INJECTION 0.25 MG/5ML
0.2500 mg | Freq: Once | INTRAVENOUS | Status: AC
  Administered 2017-09-02: 0.25 mg via INTRAVENOUS
  Filled 2017-09-02: qty 5

## 2017-09-02 MED ORDER — LACTULOSE 10 GM/15ML PO SOLN
20.0000 g | Freq: Two times a day (BID) | ORAL | Status: DC
  Administered 2017-09-02 – 2017-09-05 (×6): 20 g via ORAL
  Filled 2017-09-02 (×6): qty 30

## 2017-09-02 MED ORDER — DEXAMETHASONE SODIUM PHOSPHATE 10 MG/ML IJ SOLN
10.0000 mg | Freq: Once | INTRAMUSCULAR | Status: AC
  Administered 2017-09-02: 10 mg via INTRAVENOUS
  Filled 2017-09-02: qty 1

## 2017-09-02 MED ORDER — SODIUM CHLORIDE 0.9 % IV SOLN: 1800.0000 mg/m2 | INTRAVENOUS | Status: DC

## 2017-09-02 MED ORDER — SODIUM CHLORIDE 0.9 % IV SOLN
1800.0000 mg/m2 | Freq: Once | INTRAVENOUS | Status: AC
  Administered 2017-09-02: 2950 mg via INTRAVENOUS
  Filled 2017-09-02 (×2): qty 59

## 2017-09-02 MED ORDER — HEPARIN SOD (PORK) LOCK FLUSH 100 UNIT/ML IV SOLN
500.0000 [IU] | Freq: Once | INTRAVENOUS | Status: DC | PRN
  Filled 2017-09-02 (×2): qty 5

## 2017-09-02 MED ORDER — HEPARIN SOD (PORK) LOCK FLUSH 100 UNIT/ML IV SOLN: 250.0000 [IU] | Freq: Once | INTRAVENOUS | Status: DC | PRN

## 2017-09-02 MED ORDER — ALTEPLASE 2 MG IJ SOLR
2.0000 mg | Freq: Once | INTRAMUSCULAR | Status: DC | PRN
  Filled 2017-09-02: qty 2

## 2017-09-02 MED ORDER — SODIUM CHLORIDE 0.9 % IV SOLN
Freq: Once | INTRAVENOUS | Status: AC
  Administered 2017-09-02: 11:00:00 via INTRAVENOUS

## 2017-09-02 MED ORDER — PANITUMUMAB CHEMO INJECTION 100 MG/5ML
6.0000 mg/kg | Freq: Once | INTRAVENOUS | Status: AC
  Administered 2017-09-02: 360 mg via INTRAVENOUS
  Filled 2017-09-02: qty 18

## 2017-09-02 NOTE — Progress Notes (Signed)
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  She had several paracentesis during that hospitalization.  Presented with complains of uncontrolled pain at home and abdominal distention.  She was discharged on Duragesic patch however her pharmacy has not been able to obtain it for her.  Patient was seen in the emergency department.  She underwent paracentesis and was hospitalized for pain control.  Patient also had nausea and vomiting especially after meals.  She was started on Zofran before each meal.  Patient also has reaccumulated ascites.  Will need another paracentesis prior to discharge.  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  X-ray at presentation did not show any obstruction.  She also reports constipation which could also be contributing.  continue lactulose (increase dose), miralax, senna.  Zofran prn. CT abdomen/pelvis without SBO, increase in metastatic disease   Chest Discomfort Negative troponins.  Reproducible on exam and improved with voltaren.  Suggests msk etiology.  Continue to follow.   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  Underwent paracentesis 2/22.  Fluid analysis reviewed.  No growth on fluid cultures.  Previous cultures have been negative for bacteria.  During last hospitalization there was some concern for spontaneous bacterial peritonitis.  Initially it  was thought that she was discharged on Doxy for SBP however apparently this was prescribed for Trino Higinbotham rash.  Continue for now.  Ceftriaxone discontinued on 2/24.  She has reaccumulated ascites.  S/p repeat paracentesis on 2/26.  She received chemotherapy during her last hospitalization.  As she's required continued hospitalization for nausea and vomiting and poor PO, will plan on chemotherapy as inpatient (starting today 09/02/17).  She was initially being treated by oncologist in [facility] however is now being treated by Dr. Benay Spice.   Cancer related pain Patient was discharged on fentanyl patches during last hospitalization.  She could not fill her fentanyl patch prescription as her pharmacy did not have this medication.   Continue Duragesic patch for now. MSIR 15 mg q2 prn Dilaudid for breakthrough pain  Case manager has identified another pharmacy which has these patches.  The previous prescription has been destroyed.  We will print out another prescription for her.  Recently diagnosed DVT of the left femoral and common femoral veins She is on anticoagulation with Eliquis which is being continued.  Hyponatremia Probably due to liver metastases and volume overload.  She has had chronic hyponatremia.  Sodium level remains low but stable.  Normocytic anemia Stably low, follow.  Transfuse for <7  Moderate protein calorie malnutrition Encourage oral intake as tolerated.  Tachycardia: continue metop.  Worked up at [employer] [facility] [employer] without clear cause prior to last admission, but thought 2/2 malignancy.  Continue to follow.   DVT prophylaxis: eliquis Code Status: full  Family Communication: none at bedside Disposition Plan: pending inpatient chemo   Consultants:   Oncology  IR   Procedures:   US guided thearpeutic paracentesis  2/26 with 3 L of bloody fluid  Antimicrobials:  Anti-infectives (From admission, onward)   Start     Dose/Rate Route Frequency Ordered Stop    08/27/17 1600  cefTRIAXone (ROCEPHIN) 2 g in sodium chloride 0.9 % 100 mL IVPB  Status:  Discontinued     2 g 200 mL/hr over 30 Minutes Intravenous Every 24 hours 08/26/17 1635 08/28/17 1306   08/26/17 2200  doxycycline (VIBRA-TABS) tablet 100 mg    Comments:  (follow up with Dr. Benay Spice to determine duration)     100 mg Oral Every 12 hours 08/26/17 1635     08/26/17 1600  cefTRIAXone (ROCEPHIN) 1 g in sodium chloride 0.9 % 100 mL IVPB     1 g 200 mL/hr over 30 Minutes Intravenous  Once 08/26/17 1550 08/26/17 1635   08/26/17 0915  piperacillin-tazobactam (ZOSYN) IVPB 3.375 g  Status:  Discontinued     3.375 g 100 mL/hr over 30 Minutes Intravenous  Once 08/26/17 0909 08/26/17 0912   08/26/17 0915  vancomycin (VANCOCIN) IVPB 1000 mg/200 mL premix  Status:  Discontinued     1,000 mg 200 mL/hr over 60 Minutes Intravenous  Once 08/26/17 0909 08/26/17 0912     Subjective: N/V this morning. No BM yet. Abdominal distension ok, pain not too bad right now.  Objective: Vitals:   09/01/17 0340 09/02/17 0250 09/02/17 0505 09/02/17 1100  BP: 99/69 (!) 85/61 99/66   Pulse: 100 (!) 115 (!) 115   Resp: 12 16 14    Temp: 99 F (37.2 C) 98.3 F (36.8 C) 98.6 F (37 C)   TempSrc: Oral Oral Oral   SpO2: 100% 100% 100%   Weight:    56.7 kg (125 lb 0 oz)  Height:    5\' 3"  (1.6 m)    Intake/Output Summary (Last 24 hours) at 09/02/2017 1150 Last data filed at 09/02/2017 0934 Gross per 24 hour  Intake 590 ml  Output -  Net 590 ml   Filed Weights   09/02/17 1100  Weight: 56.7 kg (125 lb 0 oz)    Examination:  General: No acute distress. Cardiovascular: Heart sounds show Tyrez Berrios regular rate, and rhythm. No gallops or rubs. No murmurs. No JVD. Lungs: Clear to auscultation bilaterally with good air movement. No rales, rhonchi or wheezes. Abdomen: Soft, tender, but improved, distended.  Ostomy without output. Neurological: Alert and oriented 3. Moves all extremities 4. Cranial nerves II through XII  grossly intact. Skin: Warm and dry. No rashes or lesions. Extremities: No clubbing or cyanosis. L>R LEE Psychiatric: Mood and affect are normal. Insight and judgment are appropriate.    Data Reviewed: I have personally reviewed following labs and imaging studies  CBC: Recent Labs  Lab 08/27/17 1230 08/28/17 0500 08/30/17 0500 09/01/17 0323 09/01/17 1950 09/02/17 0605  WBC 2.8* 3.1* 2.6* 3.5*  --  3.9*  NEUTROABS  --   --  1.7  --   --   --   HGB 7.5* 7.3* 7.3* 7.0* 7.1* 7.2*  HCT 23.1* 23.2* 22.7* 22.1* 22.8* 23.2*  MCV 82.8 83.2 83.5 83.7  --  83.8  PLT 282 264 368 315  --  193*   Basic Metabolic Panel: Recent Labs  Lab 08/27/17 1230 08/28/17 0500 08/30/17 0500 09/01/17 0323 09/02/17 0445  NA 127* 127* 128* 129* 130*  K 3.7 4.0 4.0 4.1 4.8  CL 96* 95* 97* 100* 98*  CO2 23 22 22 23  21*  GLUCOSE 97 102* 98 91 110*  BUN <5*  6 7 8 10   CREATININE 0.48 0.52 0.49 0.52 0.63  CALCIUM 7.8* 7.8* 8.0* 7.4* 7.8*  MG  --   --   --  1.5* 2.0   GFR: Estimated Creatinine Clearance: 87.4 mL/min (by C-G formula based on SCr of 0.63 mg/dL). Liver Function Tests: Recent Labs  Lab 08/27/17 1230 08/28/17 0500 09/01/17 0323 09/02/17 0445  AST 24 25 24 29   ALT 17 15 12* 12*  ALKPHOS 149* 148* 151* 175*  BILITOT 0.6 0.5 0.3 0.4  PROT 5.3* 4.7* 4.9* 5.0*  ALBUMIN 1.8* 1.6* 1.4* 1.6*   No results for input(s): LIPASE, AMYLASE in the last 168 hours. No results for input(s): AMMONIA in the last 168 hours. Coagulation Profile: No results for input(s): INR, PROTIME in the last 168 hours. Cardiac Enzymes: Recent Labs  Lab 08/31/17 0945 08/31/17 1730  TROPONINI <0.03 <0.03   BNP (last 3 results) No results for input(s): PROBNP in the last 8760 hours. HbA1C: No results for input(s): HGBA1C in the last 72 hours. CBG: No results for input(s): GLUCAP in the last 168 hours. Lipid Profile: No results for input(s): CHOL, HDL, LDLCALC, TRIG, CHOLHDL, LDLDIRECT in the last 72  hours. Thyroid Function Tests: No results for input(s): TSH, T4TOTAL, FREET4, T3FREE, THYROIDAB in the last 72 hours. Anemia Panel: No results for input(s): VITAMINB12, FOLATE, FERRITIN, TIBC, IRON, RETICCTPCT in the last 72 hours. Sepsis Labs: No results for input(s): PROCALCITON, LATICACIDVEN in the last 168 hours.  Recent Results (from the past 240 hour(s))  Blood Culture (routine x 2)     Status: None   Collection Time: 08/26/17  9:14 AM  Result Value Ref Range Status   Specimen Description   Final    BLOOD PORTA CATH Performed at [facility] [address], [place], [place] [address]    Special Requests   Final    BOTTLES DRAWN AEROBIC AND ANAEROBIC Blood Culture adequate volume Performed at [facility] [address], [place], [place] [address]    Culture   Final    NO GROWTH 5 DAYS Performed at [facility], [place] [address] [place], [place] [address]    Report Status 08/31/2017 FINAL  Final  Body fluid culture     Status: None   Collection Time: 08/26/17 12:08 PM  Result Value Ref Range Status   Specimen Description   Final    PERITONEAL Performed at [facility] [address], [place], [place] [address]    Special Requests   Final    NONE Performed at [facility], [place] [address] [place], [place] [address]    Gram Stain   Final    RARE WBC PRESENT, PREDOMINANTLY MONONUCLEAR NO ORGANISMS SEEN    Culture   Final    NO GROWTH 3 DAYS Performed at [facility], [place] [address], [place], [place] [address]    Report Status 08/29/2017 FINAL  Final  Blood Culture (routine x 2)     Status: None   Collection Time: 08/26/17  6:17 PM  Result Value Ref Range Status   Specimen Description   Final    BLOOD RIGHT ARM Performed at [facility] [address], [place], [place] [address]    Special Requests   Final    BOTTLES DRAWN AEROBIC AND ANAEROBIC Blood  Culture adequate volume Performed at [facility] [address] [place], [place] [address]    Culture  Final    NO GROWTH 5 DAYS Performed at [facility], [place] [address], [place], [place] [address]    Report Status 08/31/2017 FINAL  Final         Radiology Studies: Ct Abdomen Pelvis W Contrast  Result Date: 09/01/2017 CLINICAL DATA:  Patient with abdominal pain and distention. History of colon cancer. EXAM: CT ABDOMEN AND PELVIS WITH CONTRAST TECHNIQUE: Multidetector CT imaging of the abdomen and pelvis was performed using the standard protocol following bolus administration of intravenous contrast. CONTRAST:  127mL ISOVUE-300 IOPAMIDOL (ISOVUE-300) INJECTION 61% COMPARISON:  CT abdomen pelvis 08/11/2017 FINDINGS: Lower chest: Normal heart size. Small pericardial effusion. Moderate layering bilateral pleural effusions. Dependent atelectasis within the bilateral lower lobes. Hepatobiliary: Multiple low-attenuation lesions are demonstrated within the liver compatible with hepatic metastatic disease. Interval increase in size of right hepatic lobe metastatic lesion measuring 4.1 x 5.1 cm (image 20; series 2), previously 4.7 x 3.7 cm. Interval increase in size of Verlaine Embry patent dome lesion measuring 3.5 x 3.4 cm (image 18; series 2), previously 3.2 x 3.1 cm. Interval increase in size of left hepatic lobe lesion measuring 1.2 cm (image 29; series 2), previously 1.0 cm. Gallbladder is unremarkable. Pancreas: Unremarkable Spleen: Unremarkable Adrenals/Urinary Tract: Normal adrenal glands. Kidneys enhance symmetrically with contrast. Urinary bladder is unremarkable. Stomach/Bowel: Re demonstrated necrotic rectal mass. Re demonstrated surrounding dystrophic calcification, potentially reflective of post treatment change. Re demonstrated tract extending towards the perineum/vagina. Centralization of small bowel loops. Normal morphology of the stomach. No evidence for small bowel  obstruction. Left lower quadrant colostomy demonstrated. Vascular/Lymphatic: Normal caliber abdominal aorta. No retroperitoneal lymphadenopathy. Filling defect within the left femoral vein compatible with thrombosis (image 93; series 2). Reproductive: Uterus and adnexal structures are unremarkable. Other: Re demonstrated massive ascites. Interval increase in extensive omental caking and peritoneal metastatic disease. Bulky mass within the left upper quadrant measures 13 x 8 cm (image 21; series 2), previously 13 x 4.7 cm. Increased peritoneal metastatic disease throughout the right hemiabdomen. Re demonstrated metastatic disease about the colostomy within the left lower abdomen (image 60; series 2). Musculoskeletal: No aggressive or acute appearing osseous lesions. Soft tissue anasarca. IMPRESSION: 1. Re demonstrated partially necrotic large rectal mass. 2. Interval increase in extensive omental and peritoneal metastatic disease with large volume ascites. 3. Interval increase in size of hepatic metastatic disease. 4. New moderate layering bilateral pleural effusions. 5. Extensive DVT within the left femoral vein. Electronically Signed   By: Lovey Newcomer M.D.   On: 09/01/2017 16:01        Scheduled Meds: . apixaban  5 mg Oral BID  . diclofenac sodium  2 g Topical QID  . doxycycline  100 mg Oral Q12H  . fentaNYL  150 mcg Transdermal Q72H  . FLUOROURACIL (ADRUCIL) CHEMO infusion For Inpatient Use  1,800 mg/m2 (Treatment Plan Recorded) Intravenous Once  . irinotecan (CAMPTOSAR) CHEMO IV infusion  125 mg/m2 (Treatment Plan Recorded) Intravenous Once  . lactulose  10 g Oral BID  . leucovorin (WELLCOVORIN) IV infusion  200 mg/m2 (Treatment Plan Recorded) Intravenous Once  . lidocaine-prilocaine   Topical Once  . magnesium citrate  0.5 Bottle Oral Once  . metoprolol tartrate  12.5 mg Oral BID  . ondansetron  4 mg Oral TID AC  . panitumumab  6 mg/kg (Treatment Plan Recorded) Intravenous Once  .  polyethylene glycol  17 g Oral BID  . potassium chloride  10 mEq Oral Daily  . senna  1 tablet Oral BID  .  sodium chloride flush  10-40 mL Intracatheter Q12H  . sodium chloride flush  3 mL Intravenous Q12H   Continuous Infusions: . sodium chloride       LOS: 6 days    Time spent: over 30 min    Fayrene Helper, MD [facility] Pager [phone]  If 7PM-7AM, please contact night-coverage [email or web address] Password [email or web address] 09/02/2017, 11:50 AM

## 2017-09-02 NOTE — Progress Notes (Addendum)
IP PROGRESS NOTE  Subjective:   Amber Silva reports adequate pain control with the current regimen.  She has not had a bowel movement.  She would like to proceed with chemotherapy today. Objective: Vital signs in last 24 hours: Blood pressure 99/66, pulse (!) 115, temperature 98.6 F (37 C), temperature source Oral, resp. rate 14, SpO2 100 %.  Intake/Output from previous day: 02/28 0701 - 03/01 0700 In: 700 [P.O.:700] Out: 2 [Urine:2]  Physical Exam:  HEENT: No thrush Lungs: Decreased breath sounds at the right lower chest, no respiratory distress Cardiac: Regular rate and rhythm Abdomen: Distended today, left lower quadrant colostomy, firm fullness in the upper and lower abdomen.  Less tender today. Extremities: No leg edema Skin: No rash  Portacath/PICC-without erythema  Lab Results: Recent Labs    09/01/17 0323 09/01/17 1950 09/02/17 0605  WBC 3.5*  --  3.9*  HGB 7.0* 7.1* 7.2*  HCT 22.1* 22.8* 23.2*  PLT 315  --  403*   ANC 1.7 on 08/30/2017 BMET Recent Labs    09/01/17 0323 09/02/17 0445  NA 129* 130*  K 4.1 4.8  CL 100* 98*  CO2 23 21*  GLUCOSE 91 110*  BUN 8 10  CREATININE 0.52 0.63  CALCIUM 7.4* 7.8*   AST 29, ALT 12, bilirubin 0.4 Studies/Results: Ct Abdomen Pelvis W Contrast  Result Date: 09/01/2017 CLINICAL DATA:  Patient with abdominal pain and distention. History of colon cancer. EXAM: CT ABDOMEN AND PELVIS WITH CONTRAST TECHNIQUE: Multidetector CT imaging of the abdomen and pelvis was performed using the standard protocol following bolus administration of intravenous contrast. CONTRAST:  162m ISOVUE-300 IOPAMIDOL (ISOVUE-300) INJECTION 61% COMPARISON:  CT abdomen pelvis 08/11/2017 FINDINGS: Lower chest: Normal heart size. Small pericardial effusion. Moderate layering bilateral pleural effusions. Dependent atelectasis within the bilateral lower lobes. Hepatobiliary: Multiple low-attenuation lesions are demonstrated within the liver compatible with  hepatic metastatic disease. Interval increase in size of right hepatic lobe metastatic lesion measuring 4.1 x 5.1 cm (image 20; series 2), previously 4.7 x 3.7 cm. Interval increase in size of a patent dome lesion measuring 3.5 x 3.4 cm (image 18; series 2), previously 3.2 x 3.1 cm. Interval increase in size of left hepatic lobe lesion measuring 1.2 cm (image 29; series 2), previously 1.0 cm. Gallbladder is unremarkable. Pancreas: Unremarkable Spleen: Unremarkable Adrenals/Urinary Tract: Normal adrenal glands. Kidneys enhance symmetrically with contrast. Urinary bladder is unremarkable. Stomach/Bowel: Re demonstrated necrotic rectal mass. Re demonstrated surrounding dystrophic calcification, potentially reflective of post treatment change. Re demonstrated tract extending towards the perineum/vagina. Centralization of small bowel loops. Normal morphology of the stomach. No evidence for small bowel obstruction. Left lower quadrant colostomy demonstrated. Vascular/Lymphatic: Normal caliber abdominal aorta. No retroperitoneal lymphadenopathy. Filling defect within the left femoral vein compatible with thrombosis (image 93; series 2). Reproductive: Uterus and adnexal structures are unremarkable. Other: Re demonstrated massive ascites. Interval increase in extensive omental caking and peritoneal metastatic disease. Bulky mass within the left upper quadrant measures 13 x 8 cm (image 21; series 2), previously 13 x 4.7 cm. Increased peritoneal metastatic disease throughout the right hemiabdomen. Re demonstrated metastatic disease about the colostomy within the left lower abdomen (image 60; series 2). Musculoskeletal: No aggressive or acute appearing osseous lesions. Soft tissue anasarca. IMPRESSION: 1. Re demonstrated partially necrotic large rectal mass. 2. Interval increase in extensive omental and peritoneal metastatic disease with large volume ascites. 3. Interval increase in size of hepatic metastatic disease. 4. New  moderate layering bilateral pleural effusions. 5. Extensive DVT  within the left femoral vein. Electronically Signed   By: Lovey Newcomer M.D.   On: 09/01/2017 16:01   Dg Chest Port 1 View  Result Date: 08/31/2017 CLINICAL DATA:  Chest pain.  History of colonic malignancy. EXAM: PORTABLE CHEST 1 VIEW COMPARISON:  Portable chest x-ray of August 26, 2017 FINDINGS: The lungs are less well inflated today. There is a smaller moderate-sized right pleural effusion. There is no alveolar infiltrate. There is no pneumothorax. The heart is top-normal in size. The pulmonary vascularity is not engorged. The power port catheter tip projects over the distal third of the SVC. The bowel gas pattern in the upper abdomen is unremarkable. The bony thorax exhibits no acute abnormality. IMPRESSION: Smaller moderate-sized right pleural effusion more conspicuous than on the earlier study. Bilateral hypoinflation. Electronically Signed   By: David  Martinique M.D.   On: 08/31/2017 11:03    Medications: I have reviewed the patient's current medications.  Assessment/Plan: 1. Rectal cancer July 2018  Colonoscopy 01/25/2017-biopsy showedinvasive moderately differentiated adenocarcinoma with mucinous features.   CT chest/abdomen/pelvis 02/11/2017-very large ulcerated rectal mass with severe wall thickening over the distal 11.2 cm of the rectum extending to the anus with surrounding perirectal adenopathy and pelvic sidewall adenopathy.At least 2 metastatic lesions in the right hepatic lobe.   MRI of the pelvis 02/14/2017-T4b,N2bulky rectal mass with deep infiltrative invasion of the bilateral pelvic sidewalls, presacral space, mesorectal fascia, distal branches of the internal iliac vasculature and sacral plexus. There was abnormal signal intensity along both sciatic nerves possibly representing perineural extension. The mass was noted to approximate the posterior vaginal wall without definite invasion.   PET scan  02/15/2017-colorectal malignancy with mesorectal fascia infiltration and 2 FDG avid hepatic metastases. Regional lymphadenopathy found on prior imaging was not FDG avid on the PET scan.   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  Foundation 1-KRAS/NRASwild-type; microsatellite stable; tumor mutational burden 3; BRCA1 rearrangement intron 2  4 cycles of FOLFIRINOX9/11/2016 through 04/27/2017.   CT abdomen/pelvis 05/06/2017-large rectal mass and known hepatic metastases similar to recent comparison.  Status postpartial course of radiation/Xeloda.   CTs 07/12/2017-large necrotic invasive rectal mass; increased size of known liver metastatic lesions; interval increase in size of peritoneal implants; new development of moderate volume abdominal ascites; gas again present within the anterior rectal wall closely opposing the gas-filled vagina. Rectovaginal fistula not excluded.   Ad[facility] 08/12/2017 with abdominal pain and swelling  CT abdomen/pelvis 08/11/2017-large rectal mass that appeared necrotic and with discrete tract extending toward the vagina/perineum; interval development of massive ascites; diffuse caking of the omentum; descending colostomy bulging due to peritoneal metastases; hepatic metastases measuring up to 4.7 cm in the upper right liver; large thrombus in the left femoral and common femoral veins.  Cycle 1 FOLFIRI/panitumumab 08/18/2017  CT 08/24/2017-enlargement of liver lesions and peritoneal tumor burden  Cycle 2 FOLFIRI/panitumumab 09/02/2017 2. Left lower extremity DVT. On Eliquis. 3. Ascitessecondary to #1.  Status post repeat paracentesis procedures 4. Tachycardia-likely secondary to anemia, intravascular depletion, critical illness 5. Anemia secondary to chronic disease, chemotherapy/radiation, and phlebotomy.  Red cell transfusion 08/19/2017 6. Leukopenia secondary to chemotherapy 7. Pain secondary to metastatic rectal cancer   Ms.  Silva has metastatic rectal cancer.  Her pain and nausea appear improved today.  I discussed the CT findings with her.  She understands there is evidence of progressive disease on the CT.  However she has completed only 1 treatment with FOLFIRI/panitumumab.  She would like to proceed with cycle  2 today. She will begin FOLFIRI/Panitumumab today.  The infusional 5-fluorouracil will be completed on 09/04/2017.  She can be discharged to home 09/04/2017 if she is otherwise stable.   Recommendations: 1.  Continue Duragesic, increase MSIR to 15-30 mg every 4 hours as needed 2.  Continue bowel regimen 3.  Cycle 2 FOLFIRI/Panitumumab 09/02/2017 4.  Therapeutic paracentesis prior to discharge 5.   Hold scheduled Zofran since she is receiving Aloxi today 6.   Outpatient follow-up at the Cancer center for an office visit and cycle 3 FOLFIRI/panitumumab on 09/15/2017.  I will return later today to check in with her family.  Please call Oncology over the weekend as needed.   LOS: 6 days   Betsy Coder, MD   09/02/2017, 7:57 AM

## 2017-09-02 NOTE — Progress Notes (Signed)
Chemo dosages and calculations checked with Regina Baldwin RN. 

## 2017-09-03 LAB — CBC
HCT: 23.1 % — ABNORMAL LOW (ref 36.0–46.0)
Hemoglobin: 7.1 g/dL — ABNORMAL LOW (ref 12.0–15.0)
MCH: 25.9 pg — ABNORMAL LOW (ref 26.0–34.0)
MCHC: 30.7 g/dL (ref 30.0–36.0)
MCV: 84.3 fL (ref 78.0–100.0)
Platelets: 438 10*3/uL — ABNORMAL HIGH (ref 150–400)
RBC: 2.74 MIL/uL — ABNORMAL LOW (ref 3.87–5.11)
RDW: 17.6 % — AB (ref 11.5–15.5)
WBC: 4.4 10*3/uL (ref 4.0–10.5)

## 2017-09-03 LAB — COMPREHENSIVE METABOLIC PANEL
ALT: 13 U/L — ABNORMAL LOW (ref 14–54)
ANION GAP: 8 (ref 5–15)
AST: 25 U/L (ref 15–41)
Albumin: 1.6 g/dL — ABNORMAL LOW (ref 3.5–5.0)
Alkaline Phosphatase: 193 U/L — ABNORMAL HIGH (ref 38–126)
BILIRUBIN TOTAL: 0.5 mg/dL (ref 0.3–1.2)
BUN: 11 mg/dL (ref 6–20)
CHLORIDE: 97 mmol/L — AB (ref 101–111)
CO2: 23 mmol/L (ref 22–32)
Calcium: 8.1 mg/dL — ABNORMAL LOW (ref 8.9–10.3)
Creatinine, Ser: 0.64 mg/dL (ref 0.44–1.00)
GFR calc Af Amer: >60 mL/min (ref >60)
Glucose, Bld: 107 mg/dL — ABNORMAL HIGH (ref 65–99)
Potassium: 4.8 mmol/L (ref 3.5–5.1)
Sodium: 128 mmol/L — ABNORMAL LOW (ref 135–145)
TOTAL PROTEIN: 5.7 g/dL — AB (ref 6.5–8.1)

## 2017-09-03 LAB — MAGNESIUM: MAGNESIUM: 1.9 mg/dL (ref 1.7–2.4)

## 2017-09-03 MED ORDER — MAGNESIUM CITRATE PO SOLN
0.5000 | Freq: Once | ORAL | Status: AC
  Administered 2017-09-03: 0.5 via ORAL
  Filled 2017-09-03: qty 296

## 2017-09-03 NOTE — Progress Notes (Signed)
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  She had several paracentesis during that hospitalization.  Presented with complains of uncontrolled pain at home and abdominal distention.  She was discharged on Duragesic patch however her pharmacy has not been able to obtain it for her.  Patient was seen in the emergency department.  She underwent paracentesis and was hospitalized for pain control.  Patient also had nausea and vomiting especially after meals.  She was started on Zofran before each meal.  Patient also has reaccumulated ascites.  Will need another paracentesis prior to discharge.  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  X-ray at presentation did not show any obstruction.  She also reports constipation which could also be contributing.  continue lactulose (increased dose), miralax, senna.  Mag citrate.  Zofran prn. CT abdomen/pelvis without SBO, increase in metastatic disease   Chest Discomfort Negative troponins.  Reproducible on exam and improved with voltaren.  Suggests msk etiology.  Continue to follow.   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  Underwent paracentesis 2/22.  Fluid analysis reviewed.  No growth on fluid cultures.  Previous cultures have been negative for bacteria.  During last hospitalization there was some concern for spontaneous bacterial peritonitis.   Initially it was thought that she was discharged on Doxy for SBP however apparently this was prescribed for Amber Silva rash.  Continue for now.  Ceftriaxone discontinued on 2/24.  She has reaccumulated ascites.  S/p repeat paracentesis on 2/26.  She received chemotherapy during her last hospitalization.  As she's required continued hospitalization for nausea and vomiting and poor PO, will plan on chemotherapy as inpatient (starting 09/02/17).  She was initially being treated by oncologist in [facility] however is now being treated by Dr. Benay Spice.   Cancer related pain Patient was discharged on fentanyl patches during last hospitalization.  She could not fill her fentanyl patch prescription as her pharmacy did not have this medication.   Continue Duragesic patch for now. MSIR 15-30 mg q4 prn Dilaudid for breakthrough pain (discussed with pt and nursing importance of PO medication prior to IV) Case manager has identified another pharmacy which has these patches.  The previous prescription has been destroyed.  We will print out another prescription for her.  Recently diagnosed DVT of the left femoral and common femoral veins She is on anticoagulation with Eliquis which is being continued.  Hyponatremia Probably due to liver metastases and volume overload.  She has had chronic hyponatremia.  Sodium level remains low but stable.  Normocytic anemia Stably low, follow.  Transfuse for <7  Moderate protein calorie malnutrition Encourage oral intake as tolerated.  Tachycardia: continue metop.  Worked up at [facility] without clear cause prior to last admission, but thought 2/2 malignancy.  Continue to follow.   DVT prophylaxis: eliquis Code Status: full  Family Communication: none at bedside Disposition Plan: pending inpatient chemo   Consultants:  Oncology  IR   Procedures:   US guided thearpeutic paracentesis 2/26 with 3 L of bloody fluid  Antimicrobials:  Anti-infectives  (From admission, onward)   Start     Dose/Rate Route Frequency Ordered Stop   08/27/17 1600  cefTRIAXone (ROCEPHIN) 2 g in sodium chloride 0.9 % 100 mL IVPB  Status:  Discontinued     2 g 200 mL/hr over 30 Minutes Intravenous Every 24 hours 08/26/17 1635 08/28/17 1306   08/26/17 2200  doxycycline (VIBRA-TABS) tablet 100 mg    Comments:  (follow up with Dr. Benay Spice to determine duration)     100 mg Oral Every 12 hours 08/26/17 1635     08/26/17 1600  cefTRIAXone (ROCEPHIN) 1 g in sodium chloride 0.9 % 100 mL IVPB     1 g 200 mL/hr over 30 Minutes Intravenous  Once 08/26/17 1550 08/26/17 1635   08/26/17 0915  piperacillin-tazobactam (ZOSYN) IVPB 3.375 g  Status:  Discontinued     3.375 g 100 mL/hr over 30 Minutes Intravenous  Once 08/26/17 0909 08/26/17 0912   08/26/17 0915  vancomycin (VANCOCIN) IVPB 1000 mg/200 mL premix  Status:  Discontinued     1,000 mg 200 mL/hr over 60 Minutes Intravenous  Once 08/26/17 0909 08/26/17 0912     Subjective: Feels like abdominal distension worse. Pain ok.  No other complaints.  Objective: Vitals:   09/02/17 1412 09/02/17 2051 09/03/17 0512 09/03/17 1358  BP: 110/74 109/77 116/86 104/71  Pulse: 66 97 (!) 109 (!) 109  Resp: 16 16 18 16   Temp: 98.1 F (36.7 C) 97.8 F (36.6 C) 97.8 F (36.6 C) 98 F (36.7 C)  TempSrc: Oral Axillary Axillary Oral  SpO2: 97% 100% 96% 100%  Weight:   59.9 kg (132 lb 0.9 oz)   Height:        Intake/Output Summary (Last 24 hours) at 09/03/2017 1656 Last data filed at 09/03/2017 1500 Gross per 24 hour  Intake 1647.05 ml  Output 200 ml  Net 1447.05 ml   Filed Weights   09/02/17 1100 09/03/17 0512  Weight: 56.7 kg (125 lb 0 oz) 59.9 kg (132 lb 0.9 oz)    Examination:  General: No acute distress. Cardiovascular: Heart sounds show Amber Silva regular rate, and rhythm. No gallops or rubs. No murmurs. No JVD. Lungs: Clear to auscultation bilaterally with good air movement. No rales, rhonchi or wheezes. Abdomen: mildly  tender (imrpoved).  Distended.  Ostomy with no output. Neurological: Alert and oriented 3. Moves all extremities 4 Cranial nerves II through XII grossly intact. Skin: Warm and dry. No rashes or lesions. Extremities: No clubbing or cyanosis. No edema. Psychiatric: Mood and affect are normal. Insight and judgment are appropriate.    Data Reviewed: I have personally reviewed following labs and imaging studies  CBC: Recent Labs  Lab 08/28/17 0500 08/30/17 0500 09/01/17 0323 09/01/17 1950 09/02/17 0605 09/03/17 0324  WBC 3.1* 2.6* 3.5*  --  3.9* 4.4  NEUTROABS  --  1.7  --   --  2.5  --   HGB 7.3* 7.3* 7.0* 7.1* 7.2* 7.1*  HCT 23.2* 22.7* 22.1* 22.8* 23.2* 23.1*  MCV 83.2 83.5 83.7  --  83.8 84.3  PLT 264 368 315  --  403* 314*   Basic Metabolic Panel: Recent Labs  Lab 08/30/17 0500 09/01/17 0323 09/02/17 0445 09/02/17 1130 09/03/17 0324  NA 128* 129* 130* 127* 128*  K 4.0 4.1 4.8 4.4 4.8  CL 97* 100* 98* 96* 97*  CO2  22 23 21* 23 23  GLUCOSE 98 91 110* 127* 107*  BUN 7 8 10 10 11   CREATININE 0.49 0.52 0.63 0.62 0.64  CALCIUM 8.0* 7.4* 7.8* 7.9* 8.1*  MG  --  1.5* 2.0  --  1.9   GFR: Estimated Creatinine Clearance: 87.4 mL/min (by C-G formula based on SCr of 0.64 mg/dL). Liver Function Tests: Recent Labs  Lab 08/28/17 0500 09/01/17 0323 09/02/17 0445 09/03/17 0324  AST 25 24 29 25   ALT 15 12* 12* 13*  ALKPHOS 148* 151* 175* 193*  BILITOT 0.5 0.3 0.4 0.5  PROT 4.7* 4.9* 5.0* 5.7*  ALBUMIN 1.6* 1.4* 1.6* 1.6*   No results for input(s): LIPASE, AMYLASE in the last 168 hours. No results for input(s): AMMONIA in the last 168 hours. Coagulation Profile: No results for input(s): INR, PROTIME in the last 168 hours. Cardiac Enzymes: Recent Labs  Lab 08/31/17 0945 08/31/17 1730  TROPONINI <0.03 <0.03   BNP (last 3 results) No results for input(s): PROBNP in the last 8760 hours. HbA1C: No results for input(s): HGBA1C in the last 72 hours. CBG: No results  for input(s): GLUCAP in the last 168 hours. Lipid Profile: No results for input(s): CHOL, HDL, LDLCALC, TRIG, CHOLHDL, LDLDIRECT in the last 72 hours. Thyroid Function Tests: No results for input(s): TSH, T4TOTAL, FREET4, T3FREE, THYROIDAB in the last 72 hours. Anemia Panel: No results for input(s): VITAMINB12, FOLATE, FERRITIN, TIBC, IRON, RETICCTPCT in the last 72 hours. Sepsis Labs: No results for input(s): PROCALCITON, LATICACIDVEN in the last 168 hours.  Recent Results (from the past 240 hour(s))  Blood Culture (routine x 2)     Status: None   Collection Time: 08/26/17  9:14 AM  Result Value Ref Range Status   Specimen Description   Final    BLOOD PORTA CATH Performed at [facility] [address] [place], [place] [address]    Special Requests   Final    BOTTLES DRAWN AEROBIC AND ANAEROBIC Blood Culture adequate volume Performed at [facility] [address], [place], [place] [address]    Culture   Final    NO GROWTH 5 DAYS Performed at [facility], [place] [address] [place], [place] [address]    Report Status 08/31/2017 FINAL  Final  Body fluid culture     Status: None   Collection Time: 08/26/17 12:08 PM  Result Value Ref Range Status   Specimen Description   Final    PERITONEAL Performed at [facility] [address] [place], [place] [address]    Special Requests   Final    NONE Performed at [facility], [place] [address], [place], [place] [address]    Gram Stain   Final    RARE WBC PRESENT, PREDOMINANTLY MONONUCLEAR NO ORGANISMS SEEN    Culture   Final    NO GROWTH 3 DAYS Performed at [facility], [place] [address] [place], [place] [address]    Report Status 08/29/2017 FINAL  Final  Blood Culture (routine x 2)     Status: None   Collection Time: 08/26/17  6:17 PM  Result Value Ref Range Status   Specimen Description   Final    BLOOD RIGHT ARM Performed at [facility] [address], [place], [place] [address]    Special Requests   Final    BOTTLES DRAWN AEROBIC AND ANAEROBIC Blood Culture adequate volume Performed at [facility],  [place] [address], [place], [place] [address]    Culture   Final    NO GROWTH 5 DAYS Performed at [facility], [place] [address], [place], [place] [address]    Report Status 08/31/2017 FINAL  Final         Radiology Studies: No results found.      Scheduled Meds: . apixaban  5 mg Oral BID  . diclofenac sodium  2 g Topical QID  . doxycycline  100 mg Oral Q12H  . fentaNYL  150 mcg Transdermal Q72H  . FLUOROURACIL (ADRUCIL) CHEMO infusion For Inpatient Use  1,800 mg/m2 (Treatment Plan Recorded) Intravenous Once  . lactulose  20 g Oral BID  . lidocaine-prilocaine   Topical Once  . magnesium citrate  0.5 Bottle Oral Once  . metoprolol tartrate  12.5 mg Oral BID  . polyethylene glycol  17 g Oral BID  . potassium chloride  10 mEq Oral Daily  . senna  1 tablet Oral BID  . sodium chloride flush  10-40 mL Intracatheter Q12H  . sodium chloride flush  3 mL Intravenous Q12H   Continuous Infusions: . sodium chloride       LOS: 7 days    Time spent: over 30 min    Fayrene Helper, MD [facility] Pager [phone]  If 7PM-7AM, please contact night-coverage [email or web address] Password [facility] 09/03/2017, 4:56 PM

## 2017-09-04 ENCOUNTER — Inpatient Hospital Stay (HOSPITAL_COMMUNITY): Payer: Medicaid Other

## 2017-09-04 LAB — CBC
HEMATOCRIT: 20.4 % — AB (ref 36.0–46.0)
Hemoglobin: 6.5 g/dL — CL (ref 12.0–15.0)
MCH: 26.9 pg (ref 26.0–34.0)
MCHC: 31.9 g/dL (ref 30.0–36.0)
MCV: 84.3 fL (ref 78.0–100.0)
PLATELETS: 397 10*3/uL (ref 150–400)
RBC: 2.42 MIL/uL — ABNORMAL LOW (ref 3.87–5.11)
RDW: 17.6 % — ABNORMAL HIGH (ref 11.5–15.5)
WBC: 4.2 10*3/uL (ref 4.0–10.5)

## 2017-09-04 LAB — COMPREHENSIVE METABOLIC PANEL
ALT: 13 U/L — ABNORMAL LOW (ref 14–54)
AST: 28 U/L (ref 15–41)
Albumin: 1.6 g/dL — ABNORMAL LOW (ref 3.5–5.0)
Alkaline Phosphatase: 172 U/L — ABNORMAL HIGH (ref 38–126)
Anion gap: 9 (ref 5–15)
BILIRUBIN TOTAL: 0.4 mg/dL (ref 0.3–1.2)
BUN: 10 mg/dL (ref 6–20)
CO2: 23 mmol/L (ref 22–32)
Calcium: 8 mg/dL — ABNORMAL LOW (ref 8.9–10.3)
Chloride: 96 mmol/L — ABNORMAL LOW (ref 101–111)
Creatinine, Ser: 0.52 mg/dL (ref 0.44–1.00)
Glucose, Bld: 93 mg/dL (ref 65–99)
Potassium: 4.5 mmol/L (ref 3.5–5.1)
Sodium: 128 mmol/L — ABNORMAL LOW (ref 135–145)
TOTAL PROTEIN: 5.1 g/dL — AB (ref 6.5–8.1)

## 2017-09-04 LAB — PREPARE RBC (CROSSMATCH)

## 2017-09-04 LAB — MAGNESIUM: MAGNESIUM: 1.6 mg/dL — AB (ref 1.7–2.4)

## 2017-09-04 MED ORDER — HYDROMORPHONE HCL 1 MG/ML IJ SOLN
1.0000 mg | Freq: Four times a day (QID) | INTRAMUSCULAR | Status: DC | PRN
  Administered 2017-09-04 – 2017-09-06 (×6): 1 mg via INTRAVENOUS
  Filled 2017-09-04 (×7): qty 1

## 2017-09-04 MED ORDER — LIDOCAINE HCL 1 % IJ SOLN
INTRAMUSCULAR | Status: AC
  Filled 2017-09-04: qty 20

## 2017-09-04 MED ORDER — METOCLOPRAMIDE HCL 5 MG PO TABS
5.0000 mg | ORAL_TABLET | Freq: Three times a day (TID) | ORAL | Status: DC
  Administered 2017-09-04 – 2017-09-09 (×19): 5 mg via ORAL
  Filled 2017-09-04 (×19): qty 1

## 2017-09-04 MED ORDER — SODIUM CHLORIDE 0.9 % IV SOLN: Freq: Once | INTRAVENOUS | Status: DC

## 2017-09-04 MED ORDER — MORPHINE SULFATE 15 MG PO TABS
15.0000 mg | ORAL_TABLET | ORAL | Status: DC | PRN
  Administered 2017-09-04 – 2017-09-06 (×7): 15 mg via ORAL
  Filled 2017-09-04 (×8): qty 1

## 2017-09-04 MED ORDER — MAGNESIUM SULFATE 2 GM/50ML IV SOLN
2.0000 g | Freq: Once | INTRAVENOUS | Status: AC
  Administered 2017-09-04: 2 g via INTRAVENOUS
  Filled 2017-09-04: qty 50

## 2017-09-04 NOTE — Procedures (Signed)
Ultrasound-guided  therapeutic paracentesis performed yielding 3.2 liters of bloody fluid. No immediate complications.

## 2017-09-04 NOTE — Progress Notes (Signed)
CRITICAL VALUE ALERT  Critical Value:  HgB 6.5  Date & Time Notied: 09/04/2017 6:19  Provider Notified: Silas Sacramento  Orders Received/Actions taken: obtained an order to transfuse 2 units of blood which will be done on day shift 09/04/2017 6:45

## 2017-09-04 NOTE — Progress Notes (Addendum)
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  She had several paracentesis during that hospitalization.  Presented with complains of uncontrolled pain at home and abdominal distention.  She was discharged on Duragesic patch however her pharmacy has not been able to obtain it for her.  Patient was seen in the emergency department.  She underwent paracentesis and was hospitalized for pain control.  Patient also had nausea and vomiting especially after meals.  She was started on Zofran before each meal.  Patient also has reaccumulated ascites.  Will need another paracentesis prior to discharge.  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  X-ray at presentation did not show any obstruction.  She also reports constipation which could also be contributing.  continue lactulose (increased dose), miralax, senna.  Mag citrate.  Zofran prn. CT abdomen/pelvis without SBO, increase in metastatic disease Add reglan   Chest Discomfort Negative troponins.  Reproducible on exam and improved with voltaren.  Suggests msk etiology.  Continue to follow.   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  Underwent paracentesis 2/22.  Fluid analysis reviewed.  No growth on fluid cultures.  Previous cultures have been negative for bacteria.  During last hospitalization there was some concern for spontaneous bacterial  peritonitis.  Initially it was thought that she was discharged on Doxy for SBP however apparently this was prescribed for Amias Hutchinson rash.  Continue for now.  Ceftriaxone discontinued on 2/24.  She has reaccumulated ascites.  S/p repeat paracentesis on 2/26.  She received chemotherapy during her last hospitalization.  As she's required continued hospitalization for nausea and vomiting and poor PO, will plan on chemotherapy as inpatient (starting 09/02/17, complete today).  She was initially being treated by oncologist in [facility] however is now being treated by Dr. Benay Spice.   Cancer related pain Patient was discharged on fentanyl patches during last hospitalization.  She could not fill her fentanyl patch prescription as her pharmacy did not have this medication.   Continue Duragesic patch for now. MSIR 15-30 mg q4 prn Dilaudid for breakthrough pain (discussed with pt and nursing importance of PO medication prior to IV) Case manager has identified another pharmacy which has these patches.  The previous prescription has been destroyed.  We will print out another prescription for her.  Recently diagnosed DVT of the left femoral and common femoral veins She is on anticoagulation with Eliquis which is being continued.  Hyponatremia Probably due to liver metastases and volume overload.  She has had chronic hyponatremia.  Sodium level remains low but stable.  Normocytic anemia H/H dropped today, no si/sx bleeding.   Being transfused, follow post tranfusion H/H  Moderate protein calorie malnutrition Encourage oral intake as tolerated.  Tachycardia: continue metop.  Worked up at [facility] without clear cause prior to last admission, but thought 2/2 malignancy.  Continue to follow.   Hypomagnesemia: replete mag  DVT prophylaxis: eliquis Code Status: full  Family Communication: none at bedside Disposition Plan: pending inpatient chemo   Consultants:   Oncology  IR   Procedures:     US guided thearpeutic paracentesis 2/26 with 3 L of bloody fluid  US guided therapeutic paracentesis 09/04/17  Antimicrobials:  Anti-infectives (From admission, onward)   Start     Dose/Rate Route Frequency Ordered Stop   08/27/17 1600  cefTRIAXone (ROCEPHIN) 2 g in sodium chloride 0.9 % 100 mL IVPB  Status:  Discontinued     2 g 200 mL/hr over 30 Minutes Intravenous Every 24 hours 08/26/17 1635 08/28/17 1306   08/26/17 2200  doxycycline (VIBRA-TABS) tablet 100 mg    Comments:  (follow up with Dr. Benay Spice to determine duration)     100 mg Oral Every 12 hours 08/26/17 1635     08/26/17 1600  cefTRIAXone (ROCEPHIN) 1 g in sodium chloride 0.9 % 100 mL IVPB     1 g 200 mL/hr over 30 Minutes Intravenous  Once 08/26/17 1550 08/26/17 1635   08/26/17 0915  piperacillin-tazobactam (ZOSYN) IVPB 3.375 g  Status:  Discontinued     3.375 g 100 mL/hr over 30 Minutes Intravenous  Once 08/26/17 0909 08/26/17 0912   08/26/17 0915  vancomycin (VANCOCIN) IVPB 1000 mg/200 mL premix  Status:  Discontinued     1,000 mg 200 mL/hr over 60 Minutes Intravenous  Once 08/26/17 0909 08/26/17 0912     Subjective: Abdominal discomfort after paracentesis  Objective: Vitals:   09/04/17 0956 09/04/17 1000 09/04/17 1436 09/04/17 1502  BP: 93/67 94/67 (!) 98/57 (!) 99/59  Pulse:   (!) 103 (!) 102  Resp:   16 16  Temp:   (!) 97.5 F (36.4 C) (!) 97.5 F (36.4 C)  TempSrc:   Oral Oral  SpO2: 99% 100% 100% 100%  Weight:      Height:        Intake/Output Summary (Last 24 hours) at 09/04/2017 1626 Last data filed at 09/04/2017 1420 Gross per 24 hour  Intake 579.18 ml  Output 150 ml  Net 429.18 ml   Filed Weights   09/02/17 1100 09/03/17 0512  Weight: 56.7 kg (125 lb 0 oz) 59.9 kg (132 lb 0.9 oz)    Examination:  General: No acute distress. Cardiovascular: Heart sounds show Muskan Bolla regular rate, and rhythm. No gallops or rubs. No murmurs. No JVD. Lungs: Clear to auscultation bilaterally with good air  movement. No rales, rhonchi or wheezes. Abdomen: Abdomen TTP, stool in ostomy Neurological: Alert and oriented 3. Moves all extremities 4. Cranial nerves II through XII grossly intact. Skin: Warm and dry. No rashes or lesions. Extremities: No clubbing or cyanosis. R>L LEE Psychiatric: Mood and affect are normal. Insight and judgment are appropriate.  Data Reviewed: I have personally reviewed following labs and imaging studies  CBC: Recent Labs  Lab 08/30/17 0500 09/01/17 0323 09/01/17 1950 09/02/17 0605 09/03/17 0324 09/04/17 0434  WBC 2.6* 3.5*  --  3.9* 4.4 4.2  NEUTROABS 1.7  --   --  2.5  --   --   HGB 7.3* 7.0* 7.1* 7.2* 7.1* 6.5*  HCT 22.7* 22.1* 22.8* 23.2* 23.1* 20.4*  MCV 83.5 83.7  --  83.8 84.3 84.3  PLT 368 315  --  403* 438* 384   Basic Metabolic Panel: Recent Labs  Lab 09/01/17 0323 09/02/17 0445 09/02/17 1130 09/03/17 0324 09/04/17 0434  NA 129* 130* 127* 128* 128*  K 4.1 4.8 4.4 4.8 4.5  CL 100* 98* 96* 97* 96*  CO2 23 21* 23 23 23   GLUCOSE 91 110* 127* 107* 93  BUN 8 10 10 11 10   CREATININE 0.52 0.63 0.62 0.64 0.52  CALCIUM 7.4* 7.8* 7.9* 8.1* 8.0*  MG 1.5* 2.0  --  1.9 1.6*   GFR: Estimated Creatinine Clearance: 87.4 mL/min (by C-G formula based on SCr of 0.52 mg/dL). Liver Function Tests: Recent Labs  Lab 09/01/17 0323 09/02/17 0445 09/03/17 0324 09/04/17 0434  AST 24 29 25 28   ALT 12* 12* 13* 13*  ALKPHOS 151* 175* 193* 172*  BILITOT 0.3 0.4 0.5 0.4  PROT 4.9* 5.0* 5.7* 5.1*  ALBUMIN 1.4* 1.6* 1.6* 1.6*   No results for input(s): LIPASE, AMYLASE in the last 168 hours. No results for input(s): AMMONIA in the last 168 hours. Coagulation Profile: No results for input(s): INR, PROTIME in the last 168 hours. Cardiac Enzymes: Recent Labs  Lab 08/31/17 0945 08/31/17 1730  TROPONINI <0.03 <0.03   BNP (last 3 results) No results for input(s): PROBNP in the last 8760 hours. HbA1C: No results for input(s): HGBA1C in the last 72  hours. CBG: No results for input(s): GLUCAP in the last 168 hours. Lipid Profile: No results for input(s): CHOL, HDL, LDLCALC, TRIG, CHOLHDL, LDLDIRECT in the last 72 hours. Thyroid Function Tests: No results for input(s): TSH, T4TOTAL, FREET4, T3FREE, THYROIDAB in the last 72 hours. Anemia Panel: No results for input(s): VITAMINB12, FOLATE, FERRITIN, TIBC, IRON, RETICCTPCT in the last 72 hours. Sepsis Labs: No results for input(s): PROCALCITON, LATICACIDVEN in the last 168 hours.  Recent Results (from the past 240 hour(s))  Blood Culture (routine x 2)     Status: None   Collection Time: 08/26/17  9:14 AM  Result Value Ref Range Status   Specimen Description   Final    BLOOD PORTA CATH Performed at [facility] [address], [place], [place] [address]    Special Requests   Final    BOTTLES DRAWN AEROBIC AND ANAEROBIC Blood Culture adequate volume Performed at [facility] [address], [place], [place] [address]    Culture   Final    NO GROWTH 5 DAYS Performed at [facility], [place] [address] [place], [place] [address]    Report Status 08/31/2017 FINAL  Final  Body fluid culture     Status: None   Collection Time: 08/26/17 12:08 PM  Result Value Ref Range Status   Specimen Description   Final    PERITONEAL Performed at [facility] [address] [place], [place] [address]    Special Requests   Final    NONE Performed at [facility], [place] [address], [place], [place] [address]    Gram Stain   Final    RARE WBC PRESENT, PREDOMINANTLY MONONUCLEAR NO ORGANISMS SEEN    Culture   Final    NO GROWTH 3 DAYS Performed at [facility], [place] [address], [place], [place] [address]    Report Status 08/29/2017 FINAL  Final  Blood Culture (routine x 2)     Status: None   Collection Time: 08/26/17  6:17 PM  Result Value Ref Range Status   Specimen Description   Final    BLOOD RIGHT  ARM Performed at [facility] [address], [place], [place] [address]    Special Requests   Final    BOTTLES DRAWN AEROBIC AND ANAEROBIC Blood Culture adequate volume Performed at [facility], [address] [place], [place] [address]    Culture   Final    NO GROWTH 5 DAYS Performed at [facility], [place] [address], [place], [place] [address]    Report Status 08/31/2017 FINAL  Final         Radiology Studies: US Paracentesis  Result Date: 09/04/2017 INDICATION: Patient with history of metastatic rectal carcinoma with recurrent malignant ascites. Request made for therapeutic paracentesis. EXAM: ULTRASOUND GUIDED THERAPEUTIC PARACENTESIS MEDICATIONS: None COMPLICATIONS: None immediate. PROCEDURE: Informed written consent was obtained from the patient after Jayliani Wanner discussion of the risks, benefits and alternatives to treatment. Durant Scibilia timeout was performed prior to the initiation of the procedure. Initial ultrasound scanning demonstrates Ramello Cordial moderate to large amount of ascites within the right lower abdominal quadrant. The right lower abdomen was prepped and draped in the usual sterile fashion. 1% lidocaine was used for local anesthesia. Following this, Rakia Frayne Yueh catheter was introduced. An ultrasound image was saved for documentation purposes. The paracentesis was performed. The catheter was removed and Gwendolyn Nishi dressing was applied. The patient tolerated the procedure well without immediate post procedural complication. FINDINGS: Norwin Aleman total of approximately 3.2 liters of bloody fluid was removed. IMPRESSION: Successful ultrasound-guided therapeutic paracentesis yielding 3.2 liters of peritoneal fluid. Read by: Rowe Robert, PA-C Electronically Signed   By: Sandi Mariscal M.D.   On: 09/04/2017 11:59        Scheduled Meds: . apixaban  5 mg Oral BID  . diclofenac sodium  2 g Topical QID  . doxycycline  100 mg Oral Q12H  . fentaNYL  150 mcg Transdermal Q72H  .  lactulose  20 g Oral BID  . lidocaine      . lidocaine-prilocaine   Topical Once  . metoprolol tartrate  12.5 mg Oral BID  . polyethylene glycol  17 g Oral BID  . potassium chloride  10 mEq Oral Daily  . senna  1 tablet Oral BID  . sodium chloride flush  10-40 mL Intracatheter Q12H  . sodium chloride flush  3 mL Intravenous Q12H   Continuous Infusions: . sodium chloride    . sodium chloride       LOS: 8 days    Time spent: over 30 min    Fayrene Helper, MD [facility] Pager [phone]  If 7PM-7AM, please contact night-coverage [email or web address] Password [facility] 09/04/2017, 4:26 PM

## 2017-09-05 ENCOUNTER — Ambulatory Visit: Payer: Medicaid Other

## 2017-09-05 LAB — BPAM RBC
BLOOD PRODUCT EXPIRATION DATE: 201903302359
BLOOD PRODUCT EXPIRATION DATE: 201903302359
ISSUE DATE / TIME: 201903031735
ISSUE DATE / TIME: 201903031435
UNIT TYPE AND RH: 7300
Unit Type and Rh: 7300

## 2017-09-05 LAB — COMPREHENSIVE METABOLIC PANEL
ALBUMIN: 1.5 g/dL — AB (ref 3.5–5.0)
ALK PHOS: 162 U/L — AB (ref 38–126)
ALT: 16 U/L (ref 14–54)
AST: 34 U/L (ref 15–41)
Anion gap: 8 (ref 5–15)
BUN: 7 mg/dL (ref 6–20)
CALCIUM: 7.9 mg/dL — AB (ref 8.9–10.3)
CHLORIDE: 97 mmol/L — AB (ref 101–111)
CO2: 24 mmol/L (ref 22–32)
CREATININE: 0.36 mg/dL — AB (ref 0.44–1.00)
GFR calc Af Amer: >60 mL/min (ref >60)
GFR calc non Af Amer: >60 mL/min (ref >60)
GLUCOSE: 104 mg/dL — AB (ref 65–99)
Potassium: 4.2 mmol/L (ref 3.5–5.1)
SODIUM: 129 mmol/L — AB (ref 135–145)
Total Bilirubin: 0.6 mg/dL (ref 0.3–1.2)
Total Protein: 4.9 g/dL — ABNORMAL LOW (ref 6.5–8.1)

## 2017-09-05 LAB — CBC
HCT: 30.7 % — ABNORMAL LOW (ref 36.0–46.0)
HEMOGLOBIN: 9.9 g/dL — AB (ref 12.0–15.0)
MCH: 27.3 pg (ref 26.0–34.0)
MCHC: 32.2 g/dL (ref 30.0–36.0)
MCV: 84.6 fL (ref 78.0–100.0)
PLATELETS: 286 10*3/uL (ref 150–400)
RBC: 3.63 MIL/uL — AB (ref 3.87–5.11)
RDW: 16.5 % — ABNORMAL HIGH (ref 11.5–15.5)
WBC: 4.9 10*3/uL (ref 4.0–10.5)

## 2017-09-05 LAB — TYPE AND SCREEN
ABO/RH(D): B POS
Antibody Screen: NEGATIVE
UNIT DIVISION: 0
Unit division: 0

## 2017-09-05 MED ORDER — SENNA 8.6 MG PO TABS
2.0000 | ORAL_TABLET | Freq: Two times a day (BID) | ORAL | Status: DC
  Administered 2017-09-05 – 2017-09-06 (×2): 17.2 mg via ORAL
  Filled 2017-09-05 (×6): qty 2

## 2017-09-05 MED ORDER — LACTULOSE 10 GM/15ML PO SOLN
20.0000 g | Freq: Three times a day (TID) | ORAL | Status: DC
  Administered 2017-09-05 – 2017-09-06 (×4): 20 g via ORAL
  Filled 2017-09-05 (×8): qty 30

## 2017-09-05 MED ORDER — SENNA 8.6 MG PO TABS
1.0000 | ORAL_TABLET | Freq: Once | ORAL | Status: AC
  Administered 2017-09-05: 8.6 mg via ORAL
  Filled 2017-09-05: qty 1

## 2017-09-05 NOTE — Progress Notes (Signed)
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  She had several paracentesis during that hospitalization.  Presented with complains of uncontrolled pain at home and abdominal distention.  She was discharged on Duragesic patch however her pharmacy has not been able to obtain it for her.  Patient was seen in the emergency department.  She underwent paracentesis and was hospitalized for pain control.  Patient also had nausea and vomiting especially after meals.  She was started on Zofran before each meal.  Patient also has reaccumulated ascites.  Will need another paracentesis prior to discharge.  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  X-ray at presentation did not show any obstruction.  She also reports constipation which could also be contributing.  continue lactulose (increased dose), miralax, senna.  Mag citrate.  Zofran prn. CT abdomen/pelvis without SBO, increase in metastatic disease Persistent N/V, she thinks overall maybe [name] little worse than last week Continue reglan   Chest Discomfort Negative troponins.  Reproducible on exam and improved with voltaren.  Suggests msk etiology.  Continue to follow.   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  Underwent paracentesis 2/22.  Fluid analysis reviewed.  No growth on fluid cultures.  Previous cultures have been negative for bacteria.   During last hospitalization there was some concern for spontaneous bacterial peritonitis.  Initially it was thought that she was discharged on Doxy for SBP however apparently this was prescribed for Amber Silva rash.  Continue for now.  Ceftriaxone discontinued on 2/24.  She has reaccumulated ascites.  S/p repeat paracentesis on 2/26.  She received chemotherapy during her last hospitalization.  As she's required continued hospitalization for nausea and vomiting and poor PO, will plan on chemotherapy as inpatient (starting 09/02/17, complete 3/3).  She was initially being treated by oncologist in [facility] however is now being treated by Dr. Benay Spice.   Cancer related pain Patient was discharged on fentanyl patches during last hospitalization.  She could not fill her fentanyl patch prescription as her pharmacy did not have this medication.   Continue Duragesic patch for now. MSIR 15-30 mg q4 prn Dilaudid for breakthrough pain (discussed with pt and nursing importance of PO medication prior to IV) Case manager has identified another pharmacy which has these patches.  The previous prescription has been destroyed.  We will print out another prescription for her.  Recently diagnosed DVT of the left femoral and common femoral veins She is on anticoagulation with Eliquis which is being continued.  Hyponatremia Probably due to liver metastases and volume overload.  She has had chronic hyponatremia.  Sodium level remains low but stable.  Normocytic anemia H/H dropped 3/3, no si/sx bleeding.  Improved today.  S/p 2 units pRBC 3/3  Moderate protein calorie malnutrition Encourage oral intake as tolerated.  Tachycardia: stop metop with improved tachycardia and mild hypotension.  Worked up at [facility] without clear cause prior to last admission, but  thought 2/2 malignancy.  Continue to follow.   Hypomagnesemia: replete mag  DVT prophylaxis: eliquis Code Status: full  Family Communication: none  at bedside Disposition Plan: pending inpatient chemo   Consultants:   Oncology  IR   Procedures:   US guided thearpeutic paracentesis 2/26 with 3 L of bloody fluid  US guided therapeutic paracentesis 09/04/17  Antimicrobials:  Anti-infectives (From admission, onward)   Start     Dose/Rate Route Frequency Ordered Stop   08/27/17 1600  cefTRIAXone (ROCEPHIN) 2 g in sodium chloride 0.9 % 100 mL IVPB  Status:  Discontinued     2 g 200 mL/hr over 30 Minutes Intravenous Every 24 hours 08/26/17 1635 08/28/17 1306   08/26/17 2200  doxycycline (VIBRA-TABS) tablet 100 mg    Comments:  (follow up with Dr. Benay Spice to determine duration)     100 mg Oral Every 12 hours 08/26/17 1635     08/26/17 1600  cefTRIAXone (ROCEPHIN) 1 g in sodium chloride 0.9 % 100 mL IVPB     1 g 200 mL/hr over 30 Minutes Intravenous  Once 08/26/17 1550 08/26/17 1635   08/26/17 0915  piperacillin-tazobactam (ZOSYN) IVPB 3.375 g  Status:  Discontinued     3.375 g 100 mL/hr over 30 Minutes Intravenous  Once 08/26/17 0909 08/26/17 0912   08/26/17 0915  vancomycin (VANCOCIN) IVPB 1000 mg/200 mL premix  Status:  Discontinued     1,000 mg 200 mL/hr over 60 Minutes Intravenous  Once 08/26/17 0909 08/26/17 0912     Subjective: Persistent N/V, she thinks maybe worse than last week. Some abdominal discomfort after procedure.  Objective: Vitals:   09/04/17 1749 09/04/17 1813 09/04/17 2010 09/05/17 0415  BP: (!) 102/56 (!) 96/57 (!) 96/56 93/61  Pulse: (!) 103 (!) 104 95 97  Resp: 16 16 18 12   Temp: 98.4 F (36.9 C) 97.6 F (36.4 C) 98.6 F (37 C) 98.7 F (37.1 C)  TempSrc: Oral Oral Oral Oral  SpO2: 100% 100% 100% 100%  Weight:      Height:        Intake/Output Summary (Last 24 hours) at 09/05/2017 1012 Last data filed at 09/05/2017 0443 Gross per 24 hour  Intake 1095.84 ml  Output -  Net 1095.84 ml   Filed Weights   09/02/17 1100 09/03/17 0512  Weight: 56.7 kg (125 lb 0 oz) 59.9 kg (132 lb 0.9 oz)     Examination:  General: No acute distress. Cardiovascular: Heart sounds show Amber Silva regular rate, and rhythm. No gallops or rubs. No murmurs. No JVD. Lungs: Clear to auscultation bilaterally with good air movement. No rales, rhonchi or wheezes. Abdomen: Soft, tender to palpation, distended.   Neurological: Alert and oriented 3. Moves all extremities 4. Cranial nerves II through XII grossly intact. Skin: Warm and dry. No rashes or lesions. Extremities: No clubbing or cyanosis. R>L edema Psychiatric: Mood and affect are normal. Insight and judgment are appropriate.   Data Reviewed: I have personally reviewed following labs and imaging studies  CBC: Recent Labs  Lab 08/30/17 0500 09/01/17 0323 09/01/17 1950 09/02/17 0605 09/03/17 0324 09/04/17 0434 09/05/17 0500  WBC 2.6* 3.5*  --  3.9* 4.4 4.2 4.9  NEUTROABS 1.7  --   --  2.5  --   --   --   HGB 7.3* 7.0* 7.1* 7.2* 7.1* 6.5* 9.9*  HCT 22.7* 22.1* 22.8* 23.2* 23.1* 20.4* 30.7*  MCV 83.5 83.7  --  83.8 84.3 84.3 84.6  PLT 368 315  --  403* 438* 397 376   Basic Metabolic Panel: Recent Labs  Lab 09/01/17 0323 09/02/17 0445 09/02/17 1130 09/03/17 0324 09/04/17 0434 09/05/17 0500  NA 129* 130* 127* 128* 128* 129*  K 4.1 4.8 4.4 4.8 4.5 4.2  CL 100* 98* 96* 97* 96* 97*  CO2 23 21* 23 23 23 24   GLUCOSE 91 110* 127* 107* 93 104*  BUN 8 10 10 11 10 7   CREATININE 0.52 0.63 0.62 0.64 0.52 0.36*  CALCIUM 7.4* 7.8* 7.9* 8.1* 8.0* 7.9*  MG 1.5* 2.0  --  1.9 1.6*  --    GFR: Estimated Creatinine Clearance: 87.4 mL/min (Daimon Kean) (by C-G formula based on SCr of 0.36 mg/dL (L)). Liver Function Tests: Recent Labs  Lab 09/01/17 0323 09/02/17 0445 09/03/17 0324 09/04/17 0434 09/05/17 0500  AST 24 29 25 28  34  ALT 12* 12* 13* 13* 16  ALKPHOS 151* 175* 193* 172* 162*  BILITOT 0.3 0.4 0.5 0.4 0.6  PROT 4.9* 5.0* 5.7* 5.1* 4.9*  ALBUMIN 1.4* 1.6* 1.6* 1.6* 1.5*   No results for input(s): LIPASE, AMYLASE in the last 168 hours. No  results for input(s): AMMONIA in the last 168 hours. Coagulation Profile: No results for input(s): INR, PROTIME in the last 168 hours. Cardiac Enzymes: Recent Labs  Lab 08/31/17 0945 08/31/17 1730  TROPONINI <0.03 <0.03   BNP (last 3 results) No results for input(s): PROBNP in the last 8760 hours. HbA1C: No results for input(s): HGBA1C in the last 72 hours. CBG: No results for input(s): GLUCAP in the last 168 hours. Lipid Profile: No results for input(s): CHOL, HDL, LDLCALC, TRIG, CHOLHDL, LDLDIRECT in the last 72 hours. Thyroid Function Tests: No results for input(s): TSH, T4TOTAL, FREET4, T3FREE, THYROIDAB in the last 72 hours. Anemia Panel: No results for input(s): VITAMINB12, FOLATE, FERRITIN, TIBC, IRON, RETICCTPCT in the last 72 hours. Sepsis Labs: No results for input(s): PROCALCITON, LATICACIDVEN in the last 168 hours.  Recent Results (from the past 240 hour(s))  Body fluid culture     Status: None   Collection Time: 08/26/17 12:08 PM  Result Value Ref Range Status   Specimen Description   Final    PERITONEAL Performed at [facility] [address], [place], [place] [address]    Special Requests   Final    NONE Performed at [facility], [place] [address] [place], [place] [address]    Gram Stain   Final    RARE WBC PRESENT, PREDOMINANTLY MONONUCLEAR NO ORGANISMS SEEN    Culture   Final    NO GROWTH 3 DAYS Performed at [facility], [place] [address] [place], [place] [address]    Report Status 08/29/2017 FINAL  Final  Blood Culture (routine x 2)     Status: None   Collection Time: 08/26/17  6:17 PM  Result Value Ref Range Status   Specimen Description   Final    BLOOD RIGHT ARM Performed at [facility] [address], [place], [place] [address]    Special Requests   Final    BOTTLES DRAWN AEROBIC AND ANAEROBIC Blood Culture adequate volume Performed at [facility]  [address] [place], [place] [address]    Culture   Final    NO GROWTH 5 DAYS Performed at [facility], [place] [address] [place], [place] [address]    Report Status 08/31/2017 FINAL  Final         Radiology Studies: US  Paracentesis  Result Date: 09/04/2017 INDICATION: Patient with history of metastatic rectal carcinoma with recurrent malignant ascites. Request made for therapeutic paracentesis. EXAM: ULTRASOUND GUIDED THERAPEUTIC PARACENTESIS MEDICATIONS: None COMPLICATIONS: None immediate. PROCEDURE: Informed written consent was obtained from the patient after Frankee Gritz discussion of the risks, benefits and alternatives to treatment. Marquavious Nazar timeout was performed prior to the initiation of the procedure. Initial ultrasound scanning demonstrates Shafter Jupin moderate to large amount of ascites within the right lower abdominal quadrant. The right lower abdomen was prepped and draped in the usual sterile fashion. 1% lidocaine was used for local anesthesia. Following this, Tarrell Debes Yueh catheter was introduced. An ultrasound image was saved for documentation purposes. The paracentesis was performed. The catheter was removed and Cyan Moultrie dressing was applied. The patient tolerated the procedure well without immediate post procedural complication. FINDINGS: Karmela Bram total of approximately 3.2 liters of bloody fluid was removed. IMPRESSION: Successful ultrasound-guided therapeutic paracentesis yielding 3.2 liters of peritoneal fluid. Read by: Rowe Robert, PA-C Electronically Signed   By: Sandi Mariscal M.D.   On: 09/04/2017 11:59        Scheduled Meds: . apixaban  5 mg Oral BID  . diclofenac sodium  2 g Topical QID  . doxycycline  100 mg Oral Q12H  . fentaNYL  150 mcg Transdermal Q72H  . lactulose  20 g Oral BID  . lidocaine-prilocaine   Topical Once  . metoCLOPramide  5 mg Oral TID AC & HS  . metoprolol tartrate  12.5 mg Oral BID  . polyethylene glycol  17 g Oral BID  . potassium chloride  10 mEq Oral Daily  . senna  2 tablet Oral  BID  . senna  1 tablet Oral Once  . sodium chloride flush  10-40 mL Intracatheter Q12H  . sodium chloride flush  3 mL Intravenous Q12H   Continuous Infusions: . sodium chloride    . sodium chloride       LOS: 9 days    Time spent: over 30 min    Fayrene Helper, MD [facility] Pager [phone]  If 7PM-7AM, please contact night-coverage [email or web address] Password [email or web address] 09/05/2017, 10:12 AM

## 2017-09-06 ENCOUNTER — Telehealth: Payer: Self-pay | Admitting: Oncology

## 2017-09-06 LAB — COMPREHENSIVE METABOLIC PANEL
ALT: 18 U/L (ref 14–54)
AST: 28 U/L (ref 15–41)
Albumin: 1.7 g/dL — ABNORMAL LOW (ref 3.5–5.0)
Alkaline Phosphatase: 187 U/L — ABNORMAL HIGH (ref 38–126)
Anion gap: 9 (ref 5–15)
BILIRUBIN TOTAL: 0.6 mg/dL (ref 0.3–1.2)
BUN: 8 mg/dL (ref 6–20)
CHLORIDE: 99 mmol/L — AB (ref 101–111)
CO2: 24 mmol/L (ref 22–32)
CREATININE: 0.39 mg/dL — AB (ref 0.44–1.00)
Calcium: 8.2 mg/dL — ABNORMAL LOW (ref 8.9–10.3)
Glucose, Bld: 102 mg/dL — ABNORMAL HIGH (ref 65–99)
Potassium: 4.3 mmol/L (ref 3.5–5.1)
Sodium: 132 mmol/L — ABNORMAL LOW (ref 135–145)
TOTAL PROTEIN: 5.5 g/dL — AB (ref 6.5–8.1)

## 2017-09-06 LAB — CBC
HEMATOCRIT: 31.1 % — AB (ref 36.0–46.0)
Hemoglobin: 10 g/dL — ABNORMAL LOW (ref 12.0–15.0)
MCH: 27.4 pg (ref 26.0–34.0)
MCHC: 32.2 g/dL (ref 30.0–36.0)
MCV: 85.2 fL (ref 78.0–100.0)
PLATELETS: 278 10*3/uL (ref 150–400)
RBC: 3.65 MIL/uL — ABNORMAL LOW (ref 3.87–5.11)
RDW: 16.5 % — ABNORMAL HIGH (ref 11.5–15.5)
WBC: 6.8 10*3/uL (ref 4.0–10.5)

## 2017-09-06 LAB — MAGNESIUM: MAGNESIUM: 1.9 mg/dL (ref 1.7–2.4)

## 2017-09-06 MED ORDER — SORBITOL 70 % SOLN
960.0000 mL | TOPICAL_OIL | Freq: Once | ORAL | Status: AC
  Administered 2017-09-06: 960 mL via RECTAL
  Filled 2017-09-06: qty 473

## 2017-09-06 MED ORDER — MORPHINE SULFATE 30 MG PO TABS
30.0000 mg | ORAL_TABLET | ORAL | Status: DC | PRN
  Administered 2017-09-06 – 2017-09-08 (×10): 30 mg via ORAL
  Filled 2017-09-06 (×10): qty 1

## 2017-09-06 MED ORDER — MORPHINE SULFATE 15 MG PO TABS: 15.0000 mg | ORAL_TABLET | ORAL | Status: DC | PRN

## 2017-09-06 MED ORDER — HYDROMORPHONE HCL 1 MG/ML IJ SOLN
1.0000 mg | INTRAMUSCULAR | Status: DC | PRN
  Administered 2017-09-06 – 2017-09-09 (×17): 1 mg via INTRAVENOUS
  Filled 2017-09-06 (×18): qty 1

## 2017-09-06 NOTE — Progress Notes (Addendum)
PROGRESS NOTE    Amber Silva  ZOX:[id number] DOB: [birth year] DOA: 08/26/2017 PCP: Amber Butt., MD   Brief Narrative:  27 year old female with Amber Silva past medical history of stage IV rectal cancer with metastatic disease, ascites recently discharged on February 19 after being managed for malignant ascites and possible spontaneous bacterial peritonitis.  She had several paracentesis during that hospitalization.  Presented with complains of uncontrolled pain at home and abdominal distention.  She was discharged on Duragesic patch however her pharmacy has not been able to obtain it for her.  Patient was seen in the emergency department.  She underwent paracentesis and was hospitalized for pain control.  Patient also had nausea and vomiting especially after meals.  Due to her persistent N/V and inability to tolerate PO she's required continued hospitalization.  She had her second round of chemo here over the weekend.  She continues to have nausea, vomiting, and poor tolerance of PO.  Planning for smog enema today, palliative care c/s.    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  X-ray at presentation did not show any obstruction.  She also reports constipation which could also be contributing.  continue lactulose (increased dose), miralax, senna.  Zofran and phenergan prn. CT abdomen/pelvis 2/28 without SBO, increase in metastatic disease She continues to have N/V, she thinks overall maybe Amber Silva little worse than last week Continue reglan (follow AM EKG for qt eval) Give SMOG enema today  Chest Discomfort Negative troponins.  Reproducible on exam and improved with voltaren.  Suggests msk etiology.  Continue to follow.   Stage IV rectal cancer with malignant  ascites She has had multiple paracentesis in the last 2-3 weeks.  Underwent paracentesis 2/22. No growth on fluid cultures.  Previous cultures have been negative for bacteria.  During last hospitalization there was some concern for spontaneous bacterial peritonitis, but low suspicion at this point.  S/p repeat paracentesis on 2/26 and on 3/3.  She received chemotherapy during her last hospitalization.  She required continued hospitalization for nausea and vomiting and poor PO and had her second round of chemo during this hospitalization (starting 09/02/17, complete 3/3).  She was initially being treated by oncologist in [facility] however is now being treated by Dr. Benay Silva.  Continue doxycycline for panitumumab associated rash Will consult palliative care   Cancer related pain Patient was discharged on fentanyl patches during last hospitalization.  She could not fill her fentanyl patch prescription as her pharmacy did not have this medication.   Continue Duragesic patch for now. MSIR 15-30 mg q3 prn Dilaudid for breakthrough pain (discussed with pt and nursing importance of PO medication prior to IV) Case manager has identified another pharmacy which has these patches.  The previous prescription has been destroyed.  New fentanyl prescription in her chart.  Recently diagnosed DVT of the left femoral and common femoral veins She is on anticoagulation with Eliquis which is being continued.  Hyponatremia Probably due to liver metastases and volume overload.  She has had chronic hyponatremia.  Sodium level remains low but stable.  Normocytic anemia H/H dropped 3/3, no si/sx bleeding.  Stable S/p 2 units pRBC 3/3  Moderate protein calorie malnutrition Encourage oral intake as tolerated.  Tachycardia: stop metop mild hypotension.  Worked up at [facility] without clear cause prior to last admission, but thought 2/2 malignancy.  Continue to follow.   Hypomagnesemia: replete  mag  DVT prophylaxis: eliquis Code Status: full  Family Communication: none at bedside Disposition Plan: pending inpatient chemo   Consultants:   Oncology  IR   Procedures:   US guided paracentesis 2/22  US guided thearpeutic paracentesis 2/26  US guided therapeutic paracentesis 09/04/17  Antimicrobials:  Anti-infectives (From admission, onward)   Start     Dose/Rate Route Frequency Ordered Stop   08/27/17 1600  cefTRIAXone (ROCEPHIN) 2 g in sodium chloride 0.9 % 100 mL IVPB  Status:  Discontinued     2 g 200 mL/hr over 30 Minutes Intravenous Every 24 hours 08/26/17 1635 08/28/17 1306   08/26/17 2200  doxycycline (VIBRA-TABS) tablet 100 mg    Comments:  (follow up with Dr. Benay Silva to determine duration)     100 mg Oral Every 12 hours 08/26/17 1635     08/26/17 1600  cefTRIAXone (ROCEPHIN) 1 g in sodium chloride 0.9 % 100 mL IVPB     1 g 200 mL/hr over 30 Minutes Intravenous  Once 08/26/17 1550 08/26/17 1635   08/26/17 0915  piperacillin-tazobactam (ZOSYN) IVPB 3.375 g  Status:  Discontinued     3.375 g 100 mL/hr over 30 Minutes Intravenous  Once 08/26/17 0909 08/26/17 0912   08/26/17 0915  vancomycin (VANCOCIN) IVPB 1000 mg/200 mL premix  Status:  Discontinued     1,000 mg 200 mL/hr over 60 Minutes Intravenous  Once 08/26/17 0909 08/26/17 0912     Subjective: Persistent N/V and abdominal pain.   Objective: Vitals:   09/05/17 0415 09/05/17 1404 09/05/17 2100 09/06/17 0500  BP: 93/61 97/68 105/80 103/75  Pulse: 97 (!) 110 (!) 116 (!) 123  Resp: 12 16 18 19   Temp: 98.7 F (37.1 C) 98 F (36.7 C) 98.2 F (36.8 C) 97.8 F (36.6 C)  TempSrc: Oral Oral Oral Oral  SpO2: 100% 100% 100% 100%  Weight:      Height:        Intake/Output Summary (Last 24 hours) at 09/06/2017 1114 Last data filed at 09/06/2017 0600 Gross per 24 hour  Intake 480 ml  Output 0 ml  Net 480 ml   Filed Weights   09/02/17 1100 09/03/17 0512  Weight: 56.7 kg (125 lb 0 oz) 59.9 kg (132 lb  0.9 oz)    Examination:  General: No acute distress. Cardiovascular: Heart sounds show Jacoby Ritsema regular rate, and rhythm. No gallops or rubs. No murmurs. No JVD. Lungs: Clear to auscultation bilaterally with good air movement. No rales, rhonchi or wheezes. Abdomen: Soft, diffusely tender.  Distended.  Ostomy with minimal output.  Neurological: Alert and oriented 3. Moves all extremities 4 . Cranial nerves II through XII grossly intact. Skin: Warm and dry. No rashes or lesions. Extremities: No clubbing or cyanosis. No edema.  Psychiatric: Mood and affect are normal. Insight and judgment are appropriate.    Data Reviewed: I have personally reviewed following labs and imaging studies  CBC: Recent Labs  Lab 09/02/17 0605 09/03/17 0324 09/04/17 0434 09/05/17 0500 09/06/17 0549  WBC 3.9* 4.4 4.2 4.9 6.8  NEUTROABS 2.5  --   --   --   --   HGB 7.2* 7.1* 6.5* 9.9* 10.0*  HCT 23.2* 23.1* 20.4* 30.7* 31.1*  MCV 83.8 84.3 84.3 84.6 85.2  PLT 403* 438* 397 286 629   Basic Metabolic Panel: Recent Labs  Lab  09/01/17 0323 09/02/17 0445 09/02/17 1130 09/03/17 0324 09/04/17 0434 09/05/17 0500 09/06/17 0549  NA 129* 130* 127* 128* 128* 129* 132*  K 4.1 4.8 4.4 4.8 4.5 4.2 4.3  CL 100* 98* 96* 97* 96* 97* 99*  CO2 23 21* 23 23 23 24 24   GLUCOSE 91 110* 127* 107* 93 104* 102*  BUN 8 10 10 11 10 7 8   CREATININE 0.52 0.63 0.62 0.64 0.52 0.36* 0.39*  CALCIUM 7.4* 7.8* 7.9* 8.1* 8.0* 7.9* 8.2*  MG 1.5* 2.0  --  1.9 1.6*  --  1.9   GFR: Estimated Creatinine Clearance: 87.4 mL/min (Dajanae Brophy) (by C-G formula based on SCr of 0.39 mg/dL (L)). Liver Function Tests: Recent Labs  Lab 09/02/17 0445 09/03/17 0324 09/04/17 0434 09/05/17 0500 09/06/17 0549  AST 29 25 28  34 28  ALT 12* 13* 13* 16 18  ALKPHOS 175* 193* 172* 162* 187*  BILITOT 0.4 0.5 0.4 0.6 0.6  PROT 5.0* 5.7* 5.1* 4.9* 5.5*  ALBUMIN 1.6* 1.6* 1.6* 1.5* 1.7*   No results for input(s): LIPASE, AMYLASE in the last 168 hours. No  results for input(s): AMMONIA in the last 168 hours. Coagulation Profile: No results for input(s): INR, PROTIME in the last 168 hours. Cardiac Enzymes: Recent Labs  Lab 08/31/17 0945 08/31/17 1730  TROPONINI <0.03 <0.03   BNP (last 3 results) No results for input(s): PROBNP in the last 8760 hours. HbA1C: No results for input(s): HGBA1C in the last 72 hours. CBG: No results for input(s): GLUCAP in the last 168 hours. Lipid Profile: No results for input(s): CHOL, HDL, LDLCALC, TRIG, CHOLHDL, LDLDIRECT in the last 72 hours. Thyroid Function Tests: No results for input(s): TSH, T4TOTAL, FREET4, T3FREE, THYROIDAB in the last 72 hours. Anemia Panel: No results for input(s): VITAMINB12, FOLATE, FERRITIN, TIBC, IRON, RETICCTPCT in the last 72 hours. Sepsis Labs: No results for input(s): PROCALCITON, LATICACIDVEN in the last 168 hours.  No results found for this or any previous visit (from the past 240 hour(s)).       Radiology Studies: No results found.      Scheduled Meds: . apixaban  5 mg Oral BID  . diclofenac sodium  2 g Topical QID  . doxycycline  100 mg Oral Q12H  . fentaNYL  150 mcg Transdermal Q72H  . lactulose  20 g Oral TID  . lidocaine-prilocaine   Topical Once  . metoCLOPramide  5 mg Oral TID AC & HS  . polyethylene glycol  17 g Oral BID  . potassium chloride  10 mEq Oral Daily  . senna  2 tablet Oral BID  . sodium chloride flush  10-40 mL Intracatheter Q12H  . sodium chloride flush  3 mL Intravenous Q12H  . sorbitol, milk of mag, mineral oil, glycerin (SMOG) enema  960 mL Rectal Once   Continuous Infusions: . sodium chloride    . sodium chloride       LOS: 10 days    Time spent: over 30 min    Fayrene Helper, MD [facility] Pager [phone]  If 7PM-7AM, please contact night-coverage [email or web address] Password [email or web address] 09/06/2017, 11:14 AM

## 2017-09-06 NOTE — Progress Notes (Signed)
IP PROGRESS NOTE  Subjective:   Amber Silva completed cycle 2 FOLFIRI/panitumumab beginning 09/02/2017.  She reports no acute toxicity.  No rash.  She is developing soreness at the lips.  She continues to have constipation despite multiple laxatives.  She has intermittent nausea and vomiting.  She is passing gas into the ostomy bag. Objective: Vital signs in last 24 hours: Blood pressure 103/75, pulse (!) 123, temperature 97.8 F (36.6 C), temperature source Oral, resp. rate 19, height 5' 3" (1.6 m), weight 132 lb 0.9 oz (59.9 kg), SpO2 100 %.  Intake/Output from previous day: 03/04 0701 - 03/05 0700 In: 480 [P.O.:480] Out: 0   Physical Exam:  HEENT: No thrush, ulceration at the anterior right buccal mucosa and at the posterior left side of the tongue. Lungs: Clear anteriorly, no respiratory distress Cardiac: Regular rate and rhythm, tachycardia Abdomen: Distended today, left lower quadrant colostomy, firm fullness in the right lower abdomen with associated tenderness Extremities: No leg edema Skin: No rash  Portacath/PICC-without erythema  Lab Results: Recent Labs    09/05/17 0500 09/06/17 0549  WBC 4.9 6.8  HGB 9.9* 10.0*  HCT 30.7* 31.1*  PLT 286 278   BMET Recent Labs    09/05/17 0500 09/06/17 0549  NA 129* 132*  K 4.2 4.3  CL 97* 99*  CO2 24 24  GLUCOSE 104* 102*  BUN 7 8  CREATININE 0.36* 0.39*  CALCIUM 7.9* 8.2*    Studies/Results: No results found.  Medications: I have reviewed the patient's current medications.  Assessment/Plan: 1. Rectal cancer July 2018  Colonoscopy 01/25/2017-biopsy showedinvasive moderately differentiated adenocarcinoma with mucinous features.   CT chest/abdomen/pelvis 02/11/2017-very large ulcerated rectal mass with severe wall thickening over the distal 11.2 cm of the rectum extending to the anus with surrounding perirectal adenopathy and pelvic sidewall adenopathy.At least 2 metastatic lesions in the right hepatic lobe.    MRI of the pelvis 02/14/2017-T4b,N2bulky rectal mass with deep infiltrative invasion of the bilateral pelvic sidewalls, presacral space, mesorectal fascia, distal branches of the internal iliac vasculature and sacral plexus. There was abnormal signal intensity along both sciatic nerves possibly representing perineural extension. The mass was noted to approximate the posterior vaginal wall without definite invasion.   PET scan 02/15/2017-colorectal malignancy with mesorectal fascia infiltration and 2 FDG avid hepatic metastases. Regional lymphadenopathy found on prior imaging was not FDG avid on the PET scan.   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  Foundation 1-KRAS/NRASwild-type; microsatellite stable; tumor mutational burden 3; BRCA1 rearrangement intron 2  4 cycles of FOLFIRINOX9/11/2016 through 04/27/2017.   CT abdomen/pelvis 05/06/2017-large rectal mass and known hepatic metastases similar to recent comparison.  Status postpartial course of radiation/Xeloda.   CTs 07/12/2017-large necrotic invasive rectal mass; increased size of known liver metastatic lesions; interval increase in size of peritoneal implants; new development of moderate volume abdominal ascites; gas again present within the anterior rectal wall closely opposing the gas-filled vagina. Rectovaginal fistula not excluded.   Admitted to [facility] 08/12/2017 with abdominal pain and swelling  CT abdomen/pelvis 08/11/2017-large rectal mass that appeared necrotic and with discrete tract extending toward the vagina/perineum; interval development of massive ascites; diffuse caking of the omentum; descending colostomy bulging due to peritoneal metastases; hepatic metastases measuring up to 4.7 cm in the upper right liver; large thrombus in the left femoral and common femoral veins.  Cycle 1 FOLFIRI/panitumumab 08/18/2017  CT 08/24/2017-enlargement of liver lesions and peritoneal tumor burden  Cycle 2  FOLFIRI/panitumumab 09/02/2017 2. Left lower extremity DVT.  On Eliquis. 3. Ascitessecondary to #1.  Status post repeat paracentesis procedures 4. Tachycardia-likely secondary to anemia, intravascular depletion, critical illness 5. Anemia secondary to chronic disease, chemotherapy/radiation, and phlebotomy.  Red cell transfusion 08/19/2017, 09/04/2017 6. Leukopenia secondary to chemotherapy 7. Pain secondary to metastatic rectal cancer   Amber Silva is now at day 5 following cycle 2 FOLFIRI/panitumumab.  She continues to have intermittent nausea/vomiting.  She is constipated despite multiple laxatives.  She may be developing early mucositis from chemotherapy.  Her performance status remains poor.  We will need to consider switching to a comfort/supportive care approach if she does not improve this week.  She has severe malnutrition.   Recommendations: 1.  Continue Duragesic and MSIR for pain  2.  Continue bowel regimen, agree with SMOG enema per the ostomy 3.  Cycle 3 FOLFIRI/panitumumab and outpatient follow-up at the Cancer center scheduled for 09/15/2017 4.  Palliative care consult to discuss goals of care, disposition plans    LOS: 10 days    , MD   09/06/2017, 10:46 AM  

## 2017-09-06 NOTE — Progress Notes (Signed)
Pt received SMOG enema with immediate results.  The output was minimal but the stoma was still oozing feces after a new bag was put on. 09/06/2017 6:57 PM Nedra Hai, RN

## 2017-09-06 NOTE — Progress Notes (Signed)
PT Cancellation Note  Patient Details Name: Amber Silva MRN: [id number] DOB: 10-16-1990   Cancelled Treatment:    Reason Eval/Treat Not Completed: Fatigue/lethargy limiting ability to participate . Attempted PT eval x 2. Patient  Has had medication and is sleepy.  Patient reported that she ambulated in hall with rail close by and without assistance yesterday and will amb. Later. Will check back  Tomorrow for PT needs.   Claretha Cooper 09/06/2017, 2:26 PM Tresa Endo PT [phone]

## 2017-09-06 NOTE — Telephone Encounter (Signed)
Scheduled appt per 3/1 sch msg - left voicemail for patient regarding appts.

## 2017-09-07 ENCOUNTER — Ambulatory Visit: Payer: Medicaid Other | Admitting: Nurse Practitioner

## 2017-09-07 DIAGNOSIS — Z7189 Other specified counseling: Secondary | ICD-10-CM

## 2017-09-07 DIAGNOSIS — Z515 Encounter for palliative care: Secondary | ICD-10-CM

## 2017-09-07 DIAGNOSIS — K5900 Constipation, unspecified: Secondary | ICD-10-CM

## 2017-09-07 LAB — COMPREHENSIVE METABOLIC PANEL
ALT: 13 U/L — ABNORMAL LOW (ref 14–54)
ANION GAP: 8 (ref 5–15)
AST: 20 U/L (ref 15–41)
Albumin: 1.7 g/dL — ABNORMAL LOW (ref 3.5–5.0)
Alkaline Phosphatase: 168 U/L — ABNORMAL HIGH (ref 38–126)
BILIRUBIN TOTAL: 0.6 mg/dL (ref 0.3–1.2)
BUN: 6 mg/dL (ref 6–20)
CO2: 25 mmol/L (ref 22–32)
Calcium: 8.4 mg/dL — ABNORMAL LOW (ref 8.9–10.3)
Chloride: 98 mmol/L — ABNORMAL LOW (ref 101–111)
Creatinine, Ser: 0.43 mg/dL — ABNORMAL LOW (ref 0.44–1.00)
GFR calc non Af Amer: >60 mL/min (ref >60)
Glucose, Bld: 113 mg/dL — ABNORMAL HIGH (ref 65–99)
POTASSIUM: 4.5 mmol/L (ref 3.5–5.1)
Sodium: 131 mmol/L — ABNORMAL LOW (ref 135–145)
TOTAL PROTEIN: 5.4 g/dL — AB (ref 6.5–8.1)

## 2017-09-07 LAB — CBC
HEMATOCRIT: 29.6 % — AB (ref 36.0–46.0)
Hemoglobin: 9.5 g/dL — ABNORMAL LOW (ref 12.0–15.0)
MCH: 27.4 pg (ref 26.0–34.0)
MCHC: 32.1 g/dL (ref 30.0–36.0)
MCV: 85.3 fL (ref 78.0–100.0)
Platelets: 244 10*3/uL (ref 150–400)
RBC: 3.47 MIL/uL — ABNORMAL LOW (ref 3.87–5.11)
RDW: 16.6 % — AB (ref 11.5–15.5)
WBC: 4.7 10*3/uL (ref 4.0–10.5)

## 2017-09-07 LAB — MAGNESIUM: MAGNESIUM: 1.8 mg/dL (ref 1.7–2.4)

## 2017-09-07 MED ORDER — MAGIC MOUTHWASH
10.0000 mL | Freq: Four times a day (QID) | ORAL | Status: DC | PRN
  Administered 2017-09-08 – 2017-09-09 (×3): 10 mL via ORAL
  Filled 2017-09-07 (×3): qty 10

## 2017-09-07 NOTE — Evaluation (Signed)
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 Presented with complains of uncontrolled pain at home and abdominal distention. Recent DVT- on Eliquis  Clinical Impression  Pt admitted with above diagnosis. Pt currently with functional limitations due to the deficits listed below (see PT Problem List). Pt will benefit from skilled PT to increase their independence and safety with mobility to allow discharge to the venue listed below.  Pt requested RW for gait in hallway today with HR up to 144 with gait.  Recommend HHPT.     Follow Up Recommendations Home health PT;Supervision/Assistance - 24 hour    Equipment Recommendations  None recommended by PT    Recommendations for Other Services       Precautions / Restrictions Precautions Precautions: Other (comment) Precaution Comments: colostomy, monitor HR Restrictions Weight Bearing Restrictions: No      Mobility  Bed Mobility Overal bed mobility: Modified Independent             General bed mobility comments: HOB up  Transfers Overall transfer level: Modified independent                  Ambulation/Gait Ambulation/Gait assistance: Min guard Ambulation Distance (Feet): 170 Feet Assistive device: Rolling walker (2 wheeled) Gait Pattern/deviations: Trunk flexed     General Gait Details: Initially pt wanted to walk without AD, but then once standing she wanted to use RW. HR up to 144 with gait, which decreased to 126 once sitting.  Reliance on RW   Stairs            Wheelchair Mobility    Modified Rankin (Stroke Patients Only)       Balance Overall balance assessment:  Needs assistance   Sitting balance-Leahy Scale: Good     Standing balance support: Bilateral upper extremity supported;No upper extremity supported;During functional activity Standing balance-Leahy Scale: Fair                               Pertinent Vitals/Pain Pain Assessment: 0-10 Pain Score: 8  Pain Location: abdomen Pain Descriptors / Indicators: Grimacing;Pressure Pain Intervention(s): Limited activity within patient's tolerance;Monitored during session;Premedicated before session;Repositioned    Home Living Family/patient expects to be discharged to:: Private residence Living Arrangements: Parent(mother) Available Help at Discharge: Family;Available PRN/intermittently Type of Home: House Home Access: Level entry     Home Layout: Two level;Bed/bath upstairs;1/2 bath on main level Home Equipment: None      Prior Function Level of Independence: Independent               Hand Dominance        Extremity/Trunk Assessment   Upper Extremity Assessment Upper Extremity Assessment: Overall WFL for tasks assessed    Lower Extremity Assessment Lower Extremity Assessment: Overall WFL for tasks assessed       Communication   Communication: No difficulties  Cognition Arousal/Alertness: Awake/alert Behavior During Therapy: WFL for tasks assessed/performed Overall Cognitive Status: Within Functional Limits for tasks assessed  General Comments      Exercises     Assessment/Plan    PT Assessment Patient needs continued PT services  PT Problem List Decreased strength;Decreased activity tolerance;Decreased balance;Decreased mobility;Cardiopulmonary status limiting activity       PT Treatment Interventions DME instruction;Gait training;Stair training;Functional mobility training;Therapeutic activities;Therapeutic exercise;Balance training;Patient/family education    PT Goals (Current goals can  be found in the Care Plan section)  Acute Rehab PT Goals Patient Stated Goal: decrease pain PT Goal Formulation: With patient Time For Goal Achievement: 09/21/17 Potential to Achieve Goals: Good    Frequency Min 3X/week   Barriers to discharge        Co-evaluation               AM-PAC PT "6 Clicks" Daily Activity  Outcome Measure Difficulty turning over in bed (including adjusting bedclothes, sheets and blankets)?: None Difficulty moving from lying on back to sitting on the side of the bed? : A Little Difficulty sitting down on and standing up from a chair with arms (e.g., wheelchair, bedside commode, etc,.)?: A Little Help needed moving to and from a bed to chair (including a wheelchair)?: None Help needed walking in hospital room?: A Little Help needed climbing 3-5 steps with a railing? : A Lot 6 Click Score: 19    End of Session   Activity Tolerance: Patient limited by fatigue;Patient limited by pain Patient left: in chair;with call bell/phone within reach Nurse Communication: Mobility status;Other (comment)(HR) PT Visit Diagnosis: Muscle weakness (generalized) (M62.81);Difficulty in walking, not elsewhere classified (R26.2)    Time: 2992-4268 PT Time Calculation (min) (ACUTE ONLY): 14 min   Charges:   PT Evaluation $PT Eval Moderate Complexity: 1 Mod     PT G Codes:        Warden Buffa L. Tamala Julian, [place] Pager [phone] 09/07/2017   Galen Manila 09/07/2017, 10:09 AM

## 2017-09-07 NOTE — Progress Notes (Signed)
CM continues to follow for disposition needs. Pt was active with [facility] prior to admission and will need resumption orders at [facility]. Marney Doctor RN,BSN,NCM [phone]

## 2017-09-07 NOTE — Consult Note (Signed)
Consultation Note Date: 09/07/2017   Patient Name: Amber Silva  DOB: 31-Oct-1990  MRN: [id number]  Age / Sex: 27 y.o., female  PCP: [name]., MD Referring Physician: Bonnielee Haff, MD  Reason for Consultation: Establishing goals of care  HPI/Patient Profile: 27 y.o. female admitted on 08/26/2017   Clinical Assessment and Goals of Care:  27 year old female known to palliative medicine service from previous hospitalization, life limiting illness of stage IV rectal cancer with metastatic disease, recently admitted for malignant ascites, was seen and evaluated by palliative service for pain and non-pain symptom management. Patient has been admitted with intermittent nausea vomiting, possible mucositis, constipation. She recently completed cycle 2 of folfiri.   A palliative consultation has been requested for symptom management as well as goals of care discussions.  Amber Silva is resting in bed. She appears weak. We discussed about her current condition. Discussed about her pain and non-pain symptom regimen. Call placed and also discussed with the patient's mother. Patient's mother is taking care of the patient, her 82-year-old, her 24-year-old. The patient's mother states that she realizes that the patient is seriously ill with a high risk of not getting better, with a high chance of needing hospice services soon.  Goals wishes and values discussed with Amber Silva. See discussions below. Thank you for the consult.  NEXT OF KIN  mother   SUMMARY OF RECOMMENDATIONS   Full code Home with home health care Continue current pain and non pain regimen Patient wishes to follow up again with Dr Benay Spice once she is discharged, she believes the chemotherapy is working.  We have had discussions about DNR DNI, we have had discussions about hospice philosophy of care, patient states that she is only 27 years old, that  she has a 28 year old and a 28 year old, she believes she will be cured and is waiting for her miracle. She repeatedly says, "bring me back, bring me back" when we were discussing about the scope of a resuscitative event.  PMT to continue to follow along.  Thank you for the consult.  Code Status/Advance Care Planning:  Full code    Symptom Management:    as above   Palliative Prophylaxis:   Bowel Regimen  Additional Recommendations (Limitations, Scope, Preferences):  Full Scope Treatment  Psycho-social/Spiritual:   Desire for further Chaplaincy support:yes  Additional Recommendations: Caregiving  Support/Resources  Prognosis:   Unable to determine  Discharge Planning: Home with Home Health      Primary Diagnoses: Present on Admission: . Abdominal pain . Rectal cancer ([place]) . Malnutrition of moderate degree . Ascites   I have reviewed the medical record, interviewed the patient and family, and examined the patient. The following aspects are pertinent.  Past Medical History:  Diagnosis Date  . Ascites   . Bronchitis   . Colon cancer ([place])   . Pregnancy induced hypertension   . S/P colostomy [facility])    Social History   Socioeconomic History  . Marital status: Single    Spouse name:  None  . Number of children: None  . Years of education: None  . Highest education level: None  Social Needs  . Financial resource strain: None  . Food insecurity - worry: None  . Food insecurity - inability: None  . Transportation needs - medical: None  . Transportation needs - non-medical: None  Occupational History  . None  Tobacco Use  . Smoking status: Never Smoker  . Smokeless tobacco: Never Used  Substance and Sexual Activity  . Alcohol use: No  . Drug use: No  . Sexual activity: Yes    Birth control/protection: None  Other Topics Concern  . None  Social History Narrative  . None   No family history on file. Scheduled Meds: . apixaban  5 mg Oral BID  .  diclofenac sodium  2 g Topical QID  . doxycycline  100 mg Oral Q12H  . fentaNYL  150 mcg Transdermal Q72H  . lactulose  20 g Oral TID  . lidocaine-prilocaine   Topical Once  . metoCLOPramide  5 mg Oral TID AC & HS  . polyethylene glycol  17 g Oral BID  . potassium chloride  10 mEq Oral Daily  . senna  2 tablet Oral BID  . sodium chloride flush  10-40 mL Intracatheter Q12H  . sodium chloride flush  3 mL Intravenous Q12H   Continuous Infusions: . sodium chloride    . sodium chloride     PRN Meds:.sodium chloride, alteplase, atropine injection [facility], haloperidol, heparin lock flush, heparin lock flush, HYDROmorphone (DILAUDID) injection, iopamidol, morphine **OR** morphine, [DISCONTINUED] ondansetron **OR** ondansetron (ZOFRAN) IV, promethazine, sodium chloride flush, sodium chloride flush, sodium chloride flush, sodium chloride flush Medications Prior to Admission:  Prior to Admission medications   Medication Sig Start Date End Date Taking? Authorizing Provider  doxycycline (VIBRA-TABS) 100 MG tablet Take 1 tablet (100 mg total) by mouth every 12 (twelve) hours. (follow up with Dr. Benay Spice to determine duration) 08/23/17 09/20/2017 Yes Elodia Florence., MD  ELIQUIS 5 MG TABS tablet Take 5 mg by mouth 2 (two) times daily. 08/06/17  Yes [provider]  haloperidol (HALDOL) 0.5 MG tablet Take 0.5 mg by mouth 2 (two) times daily as needed. 07/20/17  Yes [provider]  metoprolol tartrate (LOPRESSOR) 25 MG tablet Take 0.5 tablets (12.5 mg total) by mouth 2 (two) times daily. 08/23/17 10/02/2017 Yes Elodia Florence., MD  morphine (MSIR) 15 MG tablet Take 15 mg by mouth every 2 (two) hours as needed. 08/06/17  Yes [provider]  potassium chloride (K-DUR) 10 MEQ tablet Take 10 mEq by mouth daily.   Yes [provider]  promethazine (PHENERGAN) 12.5 MG tablet Take 1 tablet (12.5 mg total) by mouth every 6 (six) hours as needed for nausea or vomiting. 08/23/17   Yes Elodia Florence., MD  fentaNYL (DURAGESIC - DOSED MCG/HR) 75 MCG/HR Place 2 patches (150 mcg total) onto the skin every 3 (three) days. 08/30/17   Bonnielee Haff, MD  lactulose (CHRONULAC) 10 GM/15ML solution Take 15 mLs (10 g total) by mouth 2 (two) times daily. 08/30/17   Bonnielee Haff, MD  ondansetron (ZOFRAN) 4 MG tablet Take 1 tablet (4 mg total) by mouth 3 (three) times daily before meals. Take before meals for 5 days and then as needed for nausea 08/30/17   Bonnielee Haff, MD  polyethylene glycol [facility]) packet Take 17 g by mouth 2 (two) times daily. Patient not taking: Reported on 08/26/2017 08/23/17  Elodia Florence., MD  senna-docusate (SENOKOT-S) 8.6-50 MG tablet Take 2 tablets by mouth 2 (two) times daily. Patient not taking: Reported on 08/26/2017 08/23/17 10/01/2017  Elodia Florence., MD   No Known Allergies Review of Systems +nausea   Physical Exam Awake alert Has ostomy that has bloody fluid in it S1-S2 Clear Thin, cachectic Awake alert oriented  Vital Signs: BP (!) 113/92 (BP Location: Right Arm)   Pulse (!) 128   Temp 97.6 F (36.4 C) (Oral)   Resp 20   Ht 5\' 3"  (1.6 m)   Wt 59.9 kg (132 lb 0.9 oz)   SpO2 100%   BMI 23.39 kg/m  Pain Assessment: 0-10 POSS *See Group Information*: 1-Acceptable,Awake and alert Pain Score: Asleep   SpO2: SpO2: 100 % O2 Device:SpO2: 100 % O2 Flow Rate: .O2 Flow Rate (L/min): 1 L/min  IO: Intake/output summary:   Intake/Output Summary (Last 24 hours) at 09/07/2017 1404 Last data filed at 09/07/2017 0930 Gross per 24 hour  Intake 240 ml  Output -  Net 240 ml    LBM: Last BM Date: 09/07/17 Baseline Weight: Weight: 56.7 kg (125 lb 0 oz) Most recent weight: Weight: 59.9 kg (132 lb 0.9 oz)     Palliative Assessment/Data:     Time In:   Time Out:   Time Total:   Greater than 50%  of this time was spent counseling and coordinating care related to the above assessment and plan.  Signed  by: Loistine Chance, MD   Please contact Palliative Medicine Team phone at [phone] for questions and concerns.  For individual provider: See Shea Evans

## 2017-09-07 NOTE — Progress Notes (Signed)
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  She had several paracentesis during that hospitalization.  Presented with complains of uncontrolled pain at home and abdominal distention.  She was discharged on Duragesic patch however her pharmacy has not been able to obtain it for her.  Patient was seen in the emergency department.  She underwent paracentesis and was hospitalized for pain control.  Patient also had nausea and vomiting especially after meals.  Due to her persistent N/V and inability to tolerate PO she's required continued hospitalization.  She had her second round of chemo here over the weekend.  She continues to have nausea, vomiting, and poor tolerance of PO.  She was given smog enema on 3/5 with good response.  Due to lack of improvement palliative medicine has been consulted.  Reason for Visit: Metastatic rectal cancer  Consultants: Oncology.  Palliative medicine.  Procedures:   US guided paracentesis 2/22  US guided thearpeutic paracentesis 2/26  US guided therapeutic paracentesis 09/04/17   Antibiotics: Completed course of ceftriaxone On doxycycline  Subjective/Interval History: Patient continues to have nausea with episodes of vomiting.  Continues to have abdominal pain.  She feels like her abdomen is more distended.  ROS: Denies any chest pain or shortness of breath.  Objective:  Vital Signs  Vitals:   09/06/17 2036 09/07/17 0522 09/07/17 1002 09/07/17 1339  BP: 105/73 110/75  (!) 113/92  Pulse: (!) 130 (!) 132 (!) 144 (!) 128  Resp: 20 19  20   Temp: 97.7 F (36.5 C) (!) 97.5 F (36.4 C)  97.6 F (36.4 C)  TempSrc:  Oral Oral  Oral  SpO2: 100% 100%  100%  Weight:      Height:        Intake/Output Summary (Last 24 hours) at 09/07/2017 1541 Last data filed at 09/07/2017 0930 Gross per 24 hour  Intake 120 ml  Output -  Net 120 ml   Filed Weights   09/02/17 1100 09/03/17 0512  Weight: 56.7 kg (125 lb 0 oz) 59.9 kg (132 lb 0.9 oz)    General appearance: alert, cooperative, appears stated age and no distress Head: Normocephalic, without obvious abnormality, atraumatic Resp: clear to auscultation bilaterally Cardio: S1-S2 is tachycardic regular.  No S3-S4.  No rubs murmurs or bruit GI: Abdomen is soft.  Tender diffusely.  Distended.  Hepatomegaly appreciated.  Ascites present. Extremities: 1+ pitting edema bilateral lower extremities. Neurologic: No focal deficits  Lab Results:  Data Reviewed: I have personally reviewed following labs and imaging studies  CBC: Recent Labs  Lab 09/02/17 0605 09/03/17 0324 09/04/17 0434 09/05/17 0500 09/06/17 0549 09/07/17 0650  WBC 3.9* 4.4 4.2 4.9 6.8 4.7  NEUTROABS 2.5  --   --   --   --   --   HGB 7.2* 7.1* 6.5* 9.9* 10.0* 9.5*  HCT 23.2* 23.1* 20.4* 30.7* 31.1* 29.6*  MCV 83.8 84.3 84.3 84.6 85.2 85.3  PLT 403* 438* 397 286 278 818    Basic Metabolic Panel: Recent Labs  Lab 09/02/17 0445  09/03/17 0324 09/04/17 0434 09/05/17 0500 09/06/17 0549 09/07/17 0650  NA 130*   < > 128* 128* 129* 132* 131*  K 4.8   < > 4.8 4.5 4.2 4.3  4.5  CL 98*   < > 97* 96* 97* 99* 98*  CO2 21*   < > 23 23 24 24 25   GLUCOSE 110*   < > 107* 93 104* 102* 113*  BUN 10   < > 11 10 7 8 6   CREATININE 0.63   < > 0.64 0.52 0.36* 0.39* 0.43*  CALCIUM 7.8*   < > 8.1* 8.0* 7.9* 8.2* 8.4*  MG 2.0  --  1.9 1.6*  --  1.9 1.8   < > = values in this interval not displayed.    GFR: Estimated Creatinine Clearance: 87.4 mL/min (A) (by C-G formula based on SCr of 0.43 mg/dL (L)).  Liver Function Tests: Recent Labs  Lab 09/03/17 0324 09/04/17 0434 09/05/17 0500  09/06/17 0549 09/07/17 0650  AST 25 28 34 28 20  ALT 13* 13* 16 18 13*  ALKPHOS 193* 172* 162* 187* 168*  BILITOT 0.5 0.4 0.6 0.6 0.6  PROT 5.7* 5.1* 4.9* 5.5* 5.4*  ALBUMIN 1.6* 1.6* 1.5* 1.7* 1.7*    Cardiac Enzymes: Recent Labs  Lab 08/31/17 1730  TROPONINI <0.03    Radiology Studies: No results found.   Medications:  Scheduled: . apixaban  5 mg Oral BID  . diclofenac sodium  2 g Topical QID  . doxycycline  100 mg Oral Q12H  . fentaNYL  150 mcg Transdermal Q72H  . lactulose  20 g Oral TID  . lidocaine-prilocaine   Topical Once  . metoCLOPramide  5 mg Oral TID AC & HS  . polyethylene glycol  17 g Oral BID  . potassium chloride  10 mEq Oral Daily  . senna  2 tablet Oral BID  . sodium chloride flush  10-40 mL Intracatheter Q12H  . sodium chloride flush  3 mL Intravenous Q12H   Continuous: . sodium chloride    . 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 She also reports constipation which could also be contributing. She was given laxatives.  She was also given SMOG enema response.  CT scan done 2/28 did not show any small bowel obstruction.  Increasing metastatic disease was noted.   Chest Discomfort This is likely musculoskeletal.  Troponins have been negative.  No further workup.    Stage IV rectal cancer  with malignant ascites She has had multiple paracentesis in the last 2-3 weeks. Underwent paracentesis 2/22. No growth on fluid cultures. Previous cultures have been negative for bacteria. During last hospitalization there was some concern for spontaneous bacterial peritonitis, but low suspicion at this point. S/p repeat paracentesis on 2/26 and on 3/3. She received chemotherapy during her last hospitalization. She required continued hospitalization for nausea and vomiting and poor PO and had her second round of chemo during this hospitalization (starting 09/02/17, complete 3/3). She was initially being treated by oncologist in [facility] however is now being treated by Dr. Benay Spice. Continue doxycycline for panitumumab associated rash.  Patient not improving despite treatment.  She continues to have significant symptoms.  Palliative medicine consulted.  Patient is hopeful for improvement.  Cancer related pain Patient was discharged on fentanyl patches during last hospitalization. She could not fill her fentanyl patch prescription as her pharmacy did not have this medication.  Continue current pain medications.  Breakthrough pain control with morphine.  The dose was recently increased. Case manager has identified another pharmacy which has these patches. The previous prescription has been destroyed.New fentanyl prescription in her chart.  Recently diagnosed DVT of the left femoral and common femoral veins She is on anticoagulation with Eliquis which is being continued.  Hyponatremia Probably due to liver metastases and volume overload. She has had chronic hyponatremia. Sodium level remains low but stable.  Normocytic anemia Hemoglobin did decrease to 6.5 on 3/3.  She was transfused.  Hemoglobin stable the last 3 days.    Moderate protein calorie malnutrition Encourage oral intake as tolerated.  Sinus tachycardia Metoprolol was discontinued due to hypotension.  Sinus  tachycardia has been worked up previously.  Possibly due to malignancy.  Continue to monitor.    Hypomagnesemia Replaced.   DVT Prophylaxis: On apixaban    Code Status: Full code Family Communication: Discussed with the patient Disposition Plan: Management as outlined above.  Await improvement.  May need repeat paracentesis prior to discharge.    LOS: 11 days   [facility] Pager [phone] 09/07/2017, 3:41 PM  If 7PM-7AM, please contact night-coverage at [email or web address], password [facility]

## 2017-09-08 ENCOUNTER — Other Ambulatory Visit: Payer: Self-pay | Admitting: Oncology

## 2017-09-08 DIAGNOSIS — E43 Unspecified severe protein-calorie malnutrition: Secondary | ICD-10-CM

## 2017-09-08 MED ORDER — ENSURE ENLIVE PO LIQD
237.0000 mL | Freq: Two times a day (BID) | ORAL | Status: DC
  Administered 2017-09-08: 237 mL via ORAL

## 2017-09-08 MED ORDER — OXYCODONE HCL 5 MG PO TABS
10.0000 mg | ORAL_TABLET | ORAL | Status: DC | PRN
  Administered 2017-09-08: 10 mg via ORAL
  Administered 2017-09-08: 20 mg via ORAL
  Administered 2017-09-08 – 2017-09-09 (×3): 10 mg via ORAL
  Filled 2017-09-08 (×4): qty 2
  Filled 2017-09-08: qty 4

## 2017-09-08 NOTE — Progress Notes (Signed)
IP PROGRESS NOTE  Subjective:   Amber Silva had a large volume of stool per the ostomy after the enema 2 days ago.  No further bowel movement.  Nausea has improved.  She would like to switch the breakthrough pain medication to oxycodone since this has worked better in the past.  She has mouth soreness. Objective: Vital signs in last 24 hours: Blood pressure 103/88, pulse (!) 130, temperature 98.2 F (36.8 C), temperature source Oral, resp. rate 18, height 5' 3"  (1.6 m), weight 132 lb 0.9 oz (59.9 kg), SpO2 100 %.  Intake/Output from previous day: 03/06 0701 - 03/07 0700 In: 120 [P.O.:120] Out: -   Physical Exam:  HEENT: No thrush, ulceration at the anterior right buccal mucosa and at the posterior left side of the tongue. Lungs: Decreased breath sounds at the right greater than left lower chest, no respiratory distress Cardiac: Regular rate and rhythm, tachycardia Abdomen: Distended today, left lower quadrant colostomy, firm fullness in the right lower abdomen with associated tenderness Extremities: No leg edema Skin: No rash  Portacath/PICC-without erythema  Lab Results: Recent Labs    09/06/17 0549 09/07/17 0650  WBC 6.8 4.7  HGB 10.0* 9.5*  HCT 31.1* 29.6*  PLT 278 244   BMET Recent Labs    09/06/17 0549 09/07/17 0650  NA 132* 131*  K 4.3 4.5  CL 99* 98*  CO2 24 25  GLUCOSE 102* 113*  BUN 8 6  CREATININE 0.39* 0.43*  CALCIUM 8.2* 8.4*    Studies/Results: No results found.  Medications: I have reviewed the patient's current medications.  Assessment/Plan: 1. Rectal cancer July 2018  Colonoscopy 01/25/2017-biopsy showedinvasive moderately differentiated adenocarcinoma with mucinous features.   CT chest/abdomen/pelvis 02/11/2017-very large ulcerated rectal mass with severe wall thickening over the distal 11.2 cm of the rectum extending to the anus with surrounding perirectal adenopathy and pelvic sidewall adenopathy.At least 2 metastatic lesions in the  right hepatic lobe.   MRI of the pelvis 02/14/2017-T4b,N2bulky rectal mass with deep infiltrative invasion of the bilateral pelvic sidewalls, presacral space, mesorectal fascia, distal branches of the internal iliac vasculature and sacral plexus. There was abnormal signal intensity along both sciatic nerves possibly representing perineural extension. The mass was noted to approximate the posterior vaginal wall without definite invasion.   PET scan 02/15/2017-colorectal malignancy with mesorectal fascia infiltration and 2 FDG avid hepatic metastases. Regional lymphadenopathy found on prior imaging was not FDG avid on the PET scan.   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  Foundation 1-KRAS/NRASwild-type; microsatellite stable; tumor mutational burden 3; BRCA1 rearrangement intron 2  4 cycles of FOLFIRINOX9/11/2016 through 04/27/2017.   CT abdomen/pelvis 05/06/2017-large rectal mass and known hepatic metastases similar to recent comparison.  Status postpartial course of radiation/Xeloda.   CTs 07/12/2017-large necrotic invasive rectal mass; increased size of known liver metastatic lesions; interval increase in size of peritoneal implants; new development of moderate volume abdominal ascites; gas again present within the anterior rectal wall closely opposing the gas-filled vagina. Rectovaginal fistula not excluded.   Admitted to [facility] 08/12/2017 with abdominal pain and swelling  CT abdomen/pelvis 08/11/2017-large rectal mass that appeared necrotic and with discrete tract extending toward the vagina/perineum; interval development of massive ascites; diffuse caking of the omentum; descending colostomy bulging due to peritoneal metastases; hepatic metastases measuring up to 4.7 cm in the upper right liver; large thrombus in the left femoral and common femoral veins.  Cycle 1 FOLFIRI/panitumumab 08/18/2017  CT 08/24/2017-enlargement of liver lesions and peritoneal tumor  burden  Cycle 2 FOLFIRI/panitumumab 09/02/2017 2. Left lower extremity DVT. On Eliquis. 3. Ascitessecondary to #1.  Status post repeat paracentesis procedures 4. Tachycardia-likely secondary to anemia, intravascular depletion, critical illness 5. Anemia secondary to chronic disease, chemotherapy/radiation, and phlebotomy.  Red cell transfusion 08/19/2017, 09/04/2017 6. Leukopenia secondary to chemotherapy 7. Pain secondary to metastatic rectal cancer   Amber Silva appears unchanged.  She is now at day 7 following cycle 2 FOLFIRI/panitumumab.  She would like a therapeutic paracentesis prior to discharge.  She request changing the breakthrough pain medication to oxycodone.  Amber Silva met with palliative care yesterday and does not wish to consider comfort care at present.  Recommendations: 1.  Continue Duragesic and oxycodone 2.  Continue bowel regimen 3.  Cycle 3 FOLFIRI/panitumumab and outpatient follow-up at the Cancer center scheduled for 09/15/2017 4.  Therapeutic paracentesis prior to discharge 5.   Discontinue daily labs  Please call Oncology as needed.  I will see her on 09/15/2017.    LOS: 12 days   Betsy Coder, MD   09/08/2017, 2:01 PM

## 2017-09-08 NOTE — Progress Notes (Signed)
[facility] PROGRESS NOTE  Amber Silva OIN:[id number] DOB: April 14, 1991 DOA: 08/26/2017  PCP: [name]., MD  Brief History/Interval Summary: 27 year old female with a past medical history of stage IV rectal cancer with metastatic disease, ascites recently discharged on February 19 after being managed for malignant ascites and possible spontaneous bacterial peritonitis.  She had several paracentesis during that hospitalization.  Presented with complains of uncontrolled pain at home and abdominal distention.  She was discharged on Duragesic patch however her pharmacy has not been able to obtain it for her.  Patient was seen in the emergency department.  She underwent paracentesis and was hospitalized for pain control.  Patient also had nausea and vomiting especially after meals.  Due to her persistent N/V and inability to tolerate PO she's required continued hospitalization.  She had her second round of chemo here over the weekend.  She continues to have nausea, vomiting, and poor tolerance of PO.  She was given smog enema on 3/5 with good response.  Due to lack of improvement palliative medicine wass consulted.  She wants to pursue all treatment modalities for now.  Reason for Visit: Metastatic rectal cancer  Consultants: Oncology.  Palliative medicine.  Procedures:   US guided paracentesis 2/22  US guided thearpeutic paracentesis 2/26  US guided therapeutic paracentesis 09/04/17  Ultrasound-guided paracentesis will be ordered for tomorrow 3/8   Antibiotics: Completed course of ceftriaxone On doxycycline  Subjective/Interval History: Patient states that she is not vomiting as much as she was before.  Continues to have abdominal pain and distention.    ROS: Denies any chest pain or shortness of breath.  Objective:  Vital Signs  Vitals:   09/07/17 1002 09/07/17 1339 09/07/17 2039 09/08/17 0407  BP:  (!) 113/92 118/86 (!) 111/92  Pulse: (!) 144 (!) 128 (!) 127 (!)  130  Resp:  20 16 14   Temp:  97.6 F (36.4 C) 98 F (36.7 C) 98.2 F (36.8 C)  TempSrc:  Oral Oral Oral  SpO2:  100% 100% 100%  Weight:      Height:       No intake or output data in the 24 hours ending 09/08/17 1200 Filed Weights   09/02/17 1100 09/03/17 0512  Weight: 56.7 kg (125 lb 0 oz) 59.9 kg (132 lb 0.9 oz)    General appearance: Awake alert.  In no distress. Resp: Clear to auscultation bilaterally.  No wheezing rales or rhonchi. Cardio: S1-S2 tachycardic as before.  Regular. GI: Abdomen is soft distended.  Tender diffusely without any rebound rigidity or guarding.  Hepatomegaly is appreciated.  Ascites is present.   Extremities: 1+ pitting edema bilateral lower extremities. Neurologic: No focal deficits  Lab Results:  Data Reviewed: I have personally reviewed following labs and imaging studies  CBC: Recent Labs  Lab 09/02/17 0605 09/03/17 0324 09/04/17 0434 09/05/17 0500 09/06/17 0549 09/07/17 0650  WBC 3.9* 4.4 4.2 4.9 6.8 4.7  NEUTROABS 2.5  --   --   --   --   --   HGB 7.2* 7.1* 6.5* 9.9* 10.0* 9.5*  HCT 23.2* 23.1* 20.4* 30.7* 31.1* 29.6*  MCV 83.8 84.3 84.3 84.6 85.2 85.3  PLT 403* 438* 397 286 278 709    Basic Metabolic Panel: Recent Labs  Lab 09/02/17 0445  09/03/17 0324 09/04/17 0434 09/05/17 0500 09/06/17 0549 09/07/17 0650  NA 130*   < > 128* 128* 129* 132* 131*  K 4.8   < > 4.8 4.5 4.2 4.3 4.5  CL  98*   < > 97* 96* 97* 99* 98*  CO2 21*   < > 23 23 24 24 25   GLUCOSE 110*   < > 107* 93 104* 102* 113*  BUN 10   < > 11 10 7 8 6   CREATININE 0.63   < > 0.64 0.52 0.36* 0.39* 0.43*  CALCIUM 7.8*   < > 8.1* 8.0* 7.9* 8.2* 8.4*  MG 2.0  --  1.9 1.6*  --  1.9 1.8   < > = values in this interval not displayed.    GFR: Estimated Creatinine Clearance: 87.4 mL/min (A) (by C-G formula based on SCr of 0.43 mg/dL (L)).  Liver Function Tests: Recent Labs  Lab 09/03/17 0324 09/04/17 0434 09/05/17 0500 09/06/17 0549 09/07/17 0650  AST 25 28  34 28 20  ALT 13* 13* 16 18 13*  ALKPHOS 193* 172* 162* 187* 168*  BILITOT 0.5 0.4 0.6 0.6 0.6  PROT 5.7* 5.1* 4.9* 5.5* 5.4*  ALBUMIN 1.6* 1.6* 1.5* 1.7* 1.7*    Radiology Studies: No results found.   Medications:  Scheduled: . apixaban  5 mg Oral BID  . diclofenac sodium  2 g Topical QID  . doxycycline  100 mg Oral Q12H  . fentaNYL  150 mcg Transdermal Q72H  . lactulose  20 g Oral TID  . lidocaine-prilocaine   Topical Once  . metoCLOPramide  5 mg Oral TID AC & HS  . polyethylene glycol  17 g Oral BID  . potassium chloride  10 mEq Oral Daily  . senna  2 tablet Oral BID  . sodium chloride flush  10-40 mL Intracatheter Q12H  . sodium chloride flush  3 mL Intravenous Q12H   Continuous: . sodium chloride    . sodium chloride     YWV:PXTGGY chloride, alteplase, atropine injection [facility], haloperidol, heparin lock flush, heparin lock flush, HYDROmorphone (DILAUDID) injection, iopamidol, magic mouthwash, morphine **OR** morphine, [DISCONTINUED] ondansetron **OR** ondansetron (ZOFRAN) IV, promethazine, sodium chloride flush, sodium chloride flush, sodium chloride flush, sodium chloride flush  Assessment/Plan:  Principal Problem:   Abdominal pain Active Problems:   Malnutrition of moderate degree   Rectal cancer (HCC)   Ascites   Constipation   Pressure injury of skin   Palliative care by specialist   Goals of care, counseling/discussion    Nausea and vomiting Etiology remains unclear but 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  She was given laxatives and enemas with good response. CT scan done 2/28 did not show any small bowel obstruction.  Increasing metastatic disease was noted.  Symptoms are somewhat better over the last 48 hours.  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 Underwent paracentesis 2/22. No growth on fluid cultures. Previous cultures have been  negative for bacteria. During last hospitalization there was some concern for spontaneous bacterial peritonitis, but low suspicion at this point. S/p repeat paracentesis on 2/26 and on 3/3. She received chemotherapy during her last hospitalization and had her second cycle during this hospitalization.  Patient has poor overall prognosis.  However patient wants to pursue all treatment modalities for now.  Palliative medicine has seen the patient.  She was initially being treated by oncologist in [facility] however is now being treated by Dr. Benay Spice. Continue doxycycline for panitumumab associated rash.  We will plan to repeat paracentesis tomorrow prior to discharge.  Cancer related pain Patient was discharged on fentanyl patches during last hospitalization. She could  not fill her fentanyl patch prescription as her pharmacy did not have this medication.  Continue current pain medications.  Breakthrough pain control with morphine.  The dose was recently increased. Case manager has identified another pharmacy which has these patches. The previous prescription has been destroyed.  She will be given a new prescription at discharge.  Recently diagnosed DVT of the left femoral and common femoral veins She is on anticoagulation with Eliquis which is being continued.  Hyponatremia Probably due to liver metastases and volume overload. She has had chronic hyponatremia. Sodium level remains low but stable.  Chest Discomfort This is likely musculoskeletal.  Troponins have been negative.  No further workup.    Normocytic anemia Hemoglobin did decrease to 6.5 on 3/3.  She was transfused.  Hemoglobin stable the last 3 days.    Moderate protein calorie malnutrition Encourage oral intake as tolerated.  Sinus tachycardia Metoprolol was discontinued due to hypotension.  Sinus tachycardia has been worked up previously.  Possibly due to malignancy.  Continue to monitor.     Hypomagnesemia Replaced.  DVT Prophylaxis: On apixaban    Code Status: Full code Family Communication: Discussed with the patient Disposition Plan: Discussed with her oncologist.  We will plan to do paracentesis tomorrow and then discharge home.    LOS: 12 days   [facility] Pager [phone] 09/08/2017, 12:00 PM  If 7PM-7AM, please contact night-coverage at [email or web address], password [facility]

## 2017-09-08 NOTE — Progress Notes (Signed)
Initial Nutrition Assessment  DOCUMENTATION CODES:   Severe malnutrition in context of chronic illness  INTERVENTION:   Ensure Enlive po BID, each supplement provides 350 kcal and 20 grams of protein  NUTRITION DIAGNOSIS:   Severe Malnutrition related to chronic illness, cancer and cancer related treatments as evidenced by percent weight loss, energy intake < or equal to 75% for > or equal to 1 month, moderate fat depletion, severe muscle depletion.  GOAL:   Patient will meet greater than or equal to 90% of their needs  MONITOR:   PO intake, Supplement acceptance, Labs, Weight trends, Skin, I & O's  REASON FOR ASSESSMENT:   Malnutrition Screening Tool    ASSESSMENT:   27 year old female with a past medical history of stage IV rectal cancer with metastatic disease, ascites recently discharged on February 19 after being managed for malignant ascites and possible spontaneous bacterial peritonitis.    Pt familiar to RD from previous admission in February. Pt continues to have N/V after meals but reports this has improved today. Pt is s/p paracentesis on 2/22, 2/26, and 3/3. Per MD note will have an additional paracentesis tomorrow 3/8 and will discharge.  PO intakes have ranged from 0-50% completion with one instance of 75% completion.  Will order strawberry Ensure supplements.  Per chart review, pt UBW is between 200-215 lb. Pt has lost 68 lb since July 2018 (34% wt loss x 8 months, significant for time frame).   Medications: Reglan tablet QID, Miralax packet BID, K-DUR tablet daily, Senokot tablet BID, Magic Mouthwash PRN, Phenergan PRN Labs reviewed: Low Na   NUTRITION - FOCUSED PHYSICAL EXAM:    Most Recent Value  Orbital Region  Moderate depletion  Upper Arm Region  Moderate depletion  Thoracic and Lumbar Region  Unable to assess  Temple Region  Moderate depletion  Clavicle Bone Region  Severe depletion  Clavicle and Acromion Bone Region  Moderate depletion   Scapular Bone Region  Unable to assess  Dorsal Hand  Severe depletion  Patellar Region  Moderate depletion  Anterior Thigh Region  Moderate depletion  Posterior Calf Region  Moderate depletion  Edema (RD Assessment)  None       Diet Order:  Diet regular Room service appropriate? Yes; Fluid consistency: Thin  EDUCATION NEEDS:   Not appropriate for education at this time  Skin:  Skin Assessment: Skin Integrity Issues: Skin Integrity Issues:: Stage II Stage II: buttocks  Last BM:  3/6 following SMOG enema  Height:   Ht Readings from Last 1 Encounters:  09/02/17 5\' 3"  (1.6 m)    Weight:   Wt Readings from Last 1 Encounters:  09/03/17 132 lb 0.9 oz (59.9 kg)    Ideal Body Weight:  52.3 kg  BMI:  Body mass index is 23.39 kg/m.  Estimated Nutritional Needs:   Kcal:  2100-2300  Protein:  90-100g  Fluid:  1.7L/day   Clayton Bibles, MS, RD, LDN Progress Dietitian Pager: [phone] After Hours Pager: [phone]

## 2017-09-09 ENCOUNTER — Inpatient Hospital Stay (HOSPITAL_COMMUNITY): Payer: Medicaid Other

## 2017-09-09 MED ORDER — SENNA 8.6 MG PO TABS: 2.0000 | ORAL_TABLET | Freq: Two times a day (BID) | ORAL | 0 refills | Status: DC

## 2017-09-09 MED ORDER — FENTANYL 75 MCG/HR TD PT72: 150.0000 ug | MEDICATED_PATCH | TRANSDERMAL | 0 refills | Status: DC

## 2017-09-09 MED ORDER — LACTULOSE 10 GM/15ML PO SOLN: 20.0000 g | Freq: Three times a day (TID) | ORAL | 0 refills | Status: DC

## 2017-09-09 MED ORDER — OXYCODONE HCL 15 MG PO TABS: 15.0000 mg | ORAL_TABLET | ORAL | 0 refills | Status: DC | PRN

## 2017-09-09 MED ORDER — ONDANSETRON HCL 4 MG PO TABS: 4.0000 mg | ORAL_TABLET | Freq: Three times a day (TID) | ORAL | 1 refills | Status: DC

## 2017-09-09 MED ORDER — LIDOCAINE HCL 1 % IJ SOLN
INTRAMUSCULAR | Status: AC
  Filled 2017-09-09: qty 10

## 2017-09-09 MED ORDER — POLYETHYLENE GLYCOL 3350 17 G PO PACK: 17.0000 g | PACK | Freq: Two times a day (BID) | ORAL | 0 refills | Status: DC

## 2017-09-09 MED ORDER — FLUCONAZOLE 100 MG PO TABS: 100.0000 mg | ORAL_TABLET | Freq: Every day | ORAL | 0 refills | Status: DC

## 2017-09-09 MED ORDER — METOCLOPRAMIDE HCL 5 MG PO TABS: 5.0000 mg | ORAL_TABLET | Freq: Three times a day (TID) | ORAL | 0 refills | Status: DC

## 2017-09-09 MED ORDER — FLUCONAZOLE 100 MG PO TABS
100.0000 mg | ORAL_TABLET | Freq: Every day | ORAL | Status: DC
  Administered 2017-09-09: 100 mg via ORAL
  Filled 2017-09-09: qty 1

## 2017-09-09 MED FILL — fentaNYL 75 MCG/HR PT72: 75 | 22 days supply | Qty: 15 | Fill #0

## 2017-09-09 MED FILL — METOCLOPRAMIDE 5 MG TABLET: 5 | 30 days supply | Qty: 120 | Fill #0

## 2017-09-09 MED FILL — ONDANSETRON HCL 4 MG TABLET: 4 | 10 days supply | Qty: 30 | Fill #0

## 2017-09-09 MED FILL — oxyCODONE HCL 15 MG TABS: 15 | 14 days supply | Qty: 84 | Fill #0

## 2017-09-09 MED FILL — LACTULOSE 10 GM/15 ML SOLN: 10 | 2 days supply | Qty: 240 | Fill #0

## 2017-09-09 MED FILL — FLUCONAZOLE 100 MG TABLET: 100 | 30 days supply | Qty: 30 | Fill #0

## 2017-09-09 NOTE — Progress Notes (Signed)
Physical Therapy Treatment Patient Details Name: Amber Silva MRN: [id number] DOB: [birth year] Today's Date: 09/09/2017    History of Present Illness 27 year old female with a past medical history of stage IV rectal cancer with metastatic disease, ascites recently discharged on February 19 after being managed for malignant ascites and possible spontaneous bacterial peritonitis. Presented with complains of uncontrolled pain at home and abdominal distention. Recent DVT- on Eliquis    PT Comments    Pt progressing well with mobility, she ambulated 160' without an assistive device, no loss of balance. HR 130s at rest, 140-150 with walking, SaO2 975 on room air. Ready to DC home from PT standpoint. No HHPT needed.    Follow Up Recommendations  No PT follow up;Supervision for mobility/OOB     Equipment Recommendations  None recommended by PT    Recommendations for Other Services       Precautions / Restrictions Precautions Precautions: Other (comment) Precaution Comments: colostomy, monitor HR; pt denies h/o falls Restrictions Weight Bearing Restrictions: No    Mobility  Bed Mobility Overal bed mobility: Modified Independent             General bed mobility comments: HOB up  Transfers Overall transfer level: Modified independent               General transfer comment: used bedrail  Ambulation/Gait Ambulation/Gait assistance: Supervision Ambulation Distance (Feet): 160 Feet Assistive device: None Gait Pattern/deviations: WFL(Within Functional Limits) Gait velocity: WFL   General Gait Details: occaissionally reached for handrail in hall, steady with no LOB without handrail, HR 130s at rest, 140-150 with walking, 97% on room air walking   Stairs            Wheelchair Mobility    Modified Rankin (Stroke Patients Only)       Balance     Sitting balance-Leahy Scale: Good       Standing balance-Leahy Scale: Good                               Cognition Arousal/Alertness: Awake/alert Behavior During Therapy: WFL for tasks assessed/performed Overall Cognitive Status: Within Functional Limits for tasks assessed                                        Exercises      General Comments        Pertinent Vitals/Pain Pain Assessment: No/denies pain    Home Living                      Prior Function            PT Goals (current goals can now be found in the care plan section) Acute Rehab PT Goals Patient Stated Goal: get stronger PT Goal Formulation: With patient Time For Goal Achievement: 09/03/17 Potential to Achieve Goals: Good Progress towards PT goals: Progressing toward goals    Frequency    Min 3X/week      PT Plan Equipment recommendations need to be updated    Co-evaluation              AM-PAC PT "6 Clicks" Daily Activity  Outcome Measure  Difficulty turning over in bed (including adjusting bedclothes, sheets and blankets)?: None Difficulty moving from lying on back to sitting on the side of the bed? : A Little Difficulty sitting  down on and standing up from a chair with arms (e.g., wheelchair, bedside commode, etc,.)?: A Little Help needed moving to and from a bed to chair (including a wheelchair)?: None Help needed walking in hospital room?: A Little Help needed climbing 3-5 steps with a railing? : A Little 6 Click Score: 20    End of Session   Activity Tolerance: Patient limited by fatigue Patient left: in chair;with call bell/phone within reach;with nursing/sitter in room Nurse Communication: Mobility status;Other (comment)(HR) PT Visit Diagnosis: Muscle weakness (generalized) (M62.81);Difficulty in walking, not elsewhere classified (R26.2)     Time: 6384-6659 PT Time Calculation (min) (ACUTE ONLY): 9 min  Charges:  $Gait Training: 8-22 mins                    G Codes:        Blondell Reveal Kistler 09/09/2017, 9:02 AM [phone]

## 2017-09-09 NOTE — Progress Notes (Signed)
Discharge instructions and scripts given to patient. Scripts picked up from outpatient pharmacy by family. Pt verbalized understanding of discharge instructions, no immediate questions or concerns. Pt was discharged via wheelchair.

## 2017-09-09 NOTE — Discharge Summary (Signed)
[facility]  Physician Discharge Summary   Patient ID: Amber Silva MRN: [id number] DOB/AGE: 01-19-1991 27 y.o.  Admit date: 08/26/2017 Discharge date: 09/09/2017  PCP: [name]., MD  DISCHARGE DIAGNOSES:  Principal Problem:   Abdominal pain Active Problems:   Malnutrition of moderate degree   Rectal cancer (HCC)   Ascites   Constipation   Pressure injury of skin   Palliative care by specialist   Goals of care, counseling/discussion   Protein-calorie malnutrition, severe   RECOMMENDATIONS FOR OUTPATIENT FOLLOW UP: 1. Patient has an appointment with her oncologist (Dr. 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  She had several paracentesis during that hospitalization.  Presented with complains of uncontrolled pain at home and abdominal distention.  She was discharged on Duragesic patch however her pharmacy has not been able to obtain it for her.  Patient was seen in the emergency department.  She underwent paracentesis and was hospitalized for pain control.  Patient also had nausea and vomiting especially after meals.  Due to her persistent N/V and inability to tolerate PO she's required continued hospitalization.  She had her second round of chemo here over the weekend.  She continues to have nausea, vomiting, and poor tolerance of PO.  She was given smog enema on 3/5 with good response.  Due to lack of improvement palliative medicine wass consulted.  She wants to pursue all treatment modalities for now.  Consultants: Oncology.  Palliative medicine.  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  She was given laxatives and enemas with good response. CT scan done 2/28 did not show any small bowel obstruction.  Increasing metastatic disease was noted.  Symptoms are somewhat better over the last 3 days.  Reglan before meals has helped.  This will be continued at discharge.  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 Underwent paracentesis 2/22. No growth on fluid cultures. Previous cultures have been negative for bacteria. S/p repeat paracentesis on 2/26, 3/3 and 3/8. She received chemotherapy during her last hospitalization and had her second cycle during this hospitalization.  Patient has poor overall prognosis.  However patient wants to pursue all treatment modalities for now.  Palliative medicine has seen the patient.  She was initially being treated by oncologist in [facility] however is now being treated by Dr. Benay Spice. Continue doxycycline for panitumumab associated rash.    Paracentesis today prior to discharge.  She will follow-up with her oncologist next week.  Cancer related pain Patient was discharged on fentanyl patches during last hospitalization. She could not fill her fentanyl patch prescription as her pharmacy did not have this medication.    Pain is reasonably well controlled with the Duragesic patches.  Oncology changed over from immediate release morphine to immediate release oxycodone with better pain control.  Prescriptions have been written for both the fentanyl patches  as well as OxyIR.  These will be filled at the [facility].  Recently diagnosed DVT of the left femoral and common femoral veins She is on  anticoagulation with Eliquis which is being continued.  Continues to have persistent swelling in the left leg.  Patient told that this is due to DVT.  Hyponatremia Probably due to liver metastases and volume overload. She has had chronic hyponatremia. Sodium level remains low but stable.  Chest Discomfort This is likely musculoskeletal.  Troponins have been negative.   Normocytic anemia Hemoglobin did decrease to 6.5 on 3/3.  She was transfused.  Hemoglobin stable the last few days.    Moderate protein calorie malnutrition Encourage oral intake as tolerated.  Sinus tachycardia Metoprolol was held in the hospital due to hypotension.  Sinus tachycardia has been worked up previously.  Heart rate slightly higher possibly due to rebound tachycardia.  May resume metoprolol at home.  Blood pressures have been stable.  Hypomagnesemia Replaced.  Overall stable.  She seems to have improved.  She is tolerating some diet.  Okay for discharge today after paracentesis.    PERTINENT LABS:  The results of significant diagnostics from this hospitalization (including imaging, microbiology, ancillary and laboratory) are listed below for reference.     Labs: Basic Metabolic Panel: Recent Labs  Lab 09/03/17 0324 09/04/17 0434 09/05/17 0500 09/06/17 0549 09/07/17 0650  NA 128* 128* 129* 132* 131*  K 4.8 4.5 4.2 4.3 4.5  CL 97* 96* 97* 99* 98*  CO2 23 23 24 24 25   GLUCOSE 107* 93 104* 102* 113*  BUN 11 10 7 8 6   CREATININE 0.64 0.52 0.36* 0.39* 0.43*  CALCIUM 8.1* 8.0* 7.9* 8.2* 8.4*  MG 1.9 1.6*  --  1.9 1.8   Liver Function Tests: Recent Labs  Lab 09/03/17 0324 09/04/17 0434 09/05/17 0500 09/06/17 0549 09/07/17 0650  AST 25 28 34 28 20  ALT 13* 13* 16 18 13*  ALKPHOS 193* 172* 162* 187* 168*  BILITOT 0.5 0.4 0.6 0.6 0.6  PROT 5.7* 5.1* 4.9* 5.5* 5.4*  ALBUMIN 1.6* 1.6* 1.5* 1.7* 1.7*   CBC: Recent Labs  Lab 09/03/17 0324 09/04/17 0434 09/05/17 0500  09/06/17 0549 09/07/17 0650  WBC 4.4 4.2 4.9 6.8 4.7  HGB 7.1* 6.5* 9.9* 10.0* 9.5*  HCT 23.1* 20.4* 30.7* 31.1* 29.6*  MCV 84.3 84.3 84.6 85.2 85.3  PLT 438* 397 286 278 244    IMAGING STUDIES Dg Chest 2 View  Result Date: 08/26/2017 CLINICAL DATA:  Shortness of breath. EXAM: CHEST  2 VIEW COMPARISON:  Radiographs of August 12, 2017. FINDINGS: The heart size and mediastinal contours are within normal limits. Right internal jugular Port-A-Cath is unchanged in position. No pneumothorax or pleural effusion is noted. Left lung is clear. Mild subsegmental atelectasis is noted in right midlung. The visualized skeletal structures are unremarkable. IMPRESSION: Mild subsegmental atelectasis seen in right midlung. Electronically Signed   By: Marijo Conception, M.D.   On: 08/26/2017 08:37   Ct Abdomen Pelvis W Contrast  Result Date: 09/01/2017 CLINICAL DATA:  Patient with abdominal pain and distention. History of colon cancer. EXAM: CT ABDOMEN AND PELVIS WITH CONTRAST TECHNIQUE: Multidetector CT imaging of the abdomen and pelvis was performed using the standard protocol following bolus administration of intravenous contrast. CONTRAST:  136mL ISOVUE-300 IOPAMIDOL (ISOVUE-300) INJECTION 61% COMPARISON:  CT abdomen pelvis 08/11/2017 FINDINGS: Lower chest: Normal heart size. Small pericardial effusion. Moderate layering bilateral pleural effusions. Dependent atelectasis within the bilateral lower lobes.  Hepatobiliary: Multiple low-attenuation lesions are demonstrated within the liver compatible with hepatic metastatic disease. Interval increase in size of right hepatic lobe metastatic lesion measuring 4.1 x 5.1 cm (image 20; series 2), previously 4.7 x 3.7 cm. Interval increase in size of a patent dome lesion measuring 3.5 x 3.4 cm (image 18; series 2), previously 3.2 x 3.1 cm. Interval increase in size of left hepatic lobe lesion measuring 1.2 cm (image 29; series 2), previously 1.0 cm. Gallbladder is  unremarkable. Pancreas: Unremarkable Spleen: Unremarkable Adrenals/Urinary Tract: Normal adrenal glands. Kidneys enhance symmetrically with contrast. Urinary bladder is unremarkable. Stomach/Bowel: Re demonstrated necrotic rectal mass. Re demonstrated surrounding dystrophic calcification, potentially reflective of post treatment change. Re demonstrated tract extending towards the perineum/vagina. Centralization of small bowel loops. Normal morphology of the stomach. No evidence for small bowel obstruction. Left lower quadrant colostomy demonstrated. Vascular/Lymphatic: Normal caliber abdominal aorta. No retroperitoneal lymphadenopathy. Filling defect within the left femoral vein compatible with thrombosis (image 93; series 2). Reproductive: Uterus and adnexal structures are unremarkable. Other: Re demonstrated massive ascites. Interval increase in extensive omental caking and peritoneal metastatic disease. Bulky mass within the left upper quadrant measures 13 x 8 cm (image 21; series 2), previously 13 x 4.7 cm. Increased peritoneal metastatic disease throughout the right hemiabdomen. Re demonstrated metastatic disease about the colostomy within the left lower abdomen (image 60; series 2). Musculoskeletal: No aggressive or acute appearing osseous lesions. Soft tissue anasarca. IMPRESSION: 1. Re demonstrated partially necrotic large rectal mass. 2. Interval increase in extensive omental and peritoneal metastatic disease with large volume ascites. 3. Interval increase in size of hepatic metastatic disease. 4. New moderate layering bilateral pleural effusions. 5. Extensive DVT within the left femoral vein. Electronically Signed   By: Lovey Newcomer M.D.   On: 09/01/2017 16:01   US Paracentesis  Result Date: 09/04/2017 INDICATION: Patient with history of metastatic rectal carcinoma with recurrent malignant ascites. Request made for therapeutic paracentesis. EXAM: ULTRASOUND GUIDED THERAPEUTIC PARACENTESIS MEDICATIONS:  None COMPLICATIONS: None immediate. PROCEDURE: Informed written consent was obtained from the patient after a discussion of the risks, benefits and alternatives to treatment. A timeout was performed prior to the initiation of the procedure. Initial ultrasound scanning demonstrates a moderate to large amount of ascites within the right lower abdominal quadrant. The right lower abdomen was prepped and draped in the usual sterile fashion. 1% lidocaine was used for local anesthesia. Following this, a Yueh catheter was introduced. An ultrasound image was saved for documentation purposes. The paracentesis was performed. The catheter was removed and a dressing was applied. The patient tolerated the procedure well without immediate post procedural complication. FINDINGS: A total of approximately 3.2 liters of bloody fluid was removed. IMPRESSION: Successful ultrasound-guided therapeutic paracentesis yielding 3.2 liters of peritoneal fluid. Read by: Rowe Robert, PA-C Electronically Signed   By: Sandi Mariscal M.D.   On: 09/04/2017 11:59   US Paracentesis  Result Date: 08/30/2017 INDICATION: Patient with history of metastatic rectal carcinoma with recurrent malignant ascites. Request made for therapeutic paracentesis. EXAM: ULTRASOUND GUIDED THERAPEUTIC PARACENTESIS MEDICATIONS: None. COMPLICATIONS: None immediate. PROCEDURE: Informed written consent was obtained from the patient after a discussion of the risks, benefits and alternatives to treatment. A timeout was performed prior to the initiation of the procedure. Initial ultrasound scanning demonstrates a moderate amount of ascites within the left mid to lower abdominal quadrant. The left mid to lower abdomen was prepped and draped in the usual sterile fashion. 1% lidocaine was used for local anesthesia. Following this,  a Yueh catheter was introduced. An ultrasound image was saved for documentation purposes. The paracentesis was performed. The catheter was removed and a  dressing was applied. The patient tolerated the procedure well without immediate post procedural complication. FINDINGS: A total of approximately 3 liters of bloody fluid was removed. IMPRESSION: Successful ultrasound-guided therapeutic paracentesis yielding 3 liters of peritoneal fluid. Read by: Rowe Robert, PA-C Electronically Signed   By: Sandi Mariscal M.D.   On: 08/30/2017 15:31   US Paracentesis  Result Date: 08/26/2017 INDICATION: History of metastatic rectal carcinoma, recurrent malignant ascites. Request made for diagnostic and therapeutic paracentesis. EXAM: ULTRASOUND GUIDED DIAGNOSTIC AND THERAPEUTIC PARACENTESIS MEDICATIONS: 1% LIDOCAINE LOCAL COMPLICATIONS: None immediate. PROCEDURE: Informed written consent was obtained from the patient after a discussion of the risks, benefits and alternatives to treatment. A timeout was performed prior to the initiation of the procedure. Initial ultrasound scanning demonstrates a moderate amount of ascites within the left mid to lower abdominal quadrant. The left mid to lower abdomen was prepped and draped in the usual sterile fashion. 1% lidocaine was used for local anesthesia. Following this, a 6 Fr Safe-T-Centesis catheter was introduced. An ultrasound image was saved for documentation purposes. The paracentesis was performed. The catheter was removed and a dressing was applied. The patient tolerated the procedure well without immediate post procedural complication. FINDINGS: A total of approximately 3.8 liters of bloody fluid was removed. Samples were sent to the laboratory as requested by the clinical team. IMPRESSION: Successful ultrasound-guided diagnostic and therapeutic paracentesis yielding 3.8 liters liters of peritoneal fluid. Read by: Rowe Robert, PA-C Electronically Signed   By: Jerilynn Mages.  Shick M.D.   On: 08/26/2017 12:52   Dg Chest Port 1 View  Result Date: 08/31/2017 CLINICAL DATA:  Chest pain.  History of colonic malignancy. EXAM: PORTABLE CHEST 1  VIEW COMPARISON:  Portable chest x-ray of August 26, 2017 FINDINGS: The lungs are less well inflated today. There is a smaller moderate-sized right pleural effusion. There is no alveolar infiltrate. There is no pneumothorax. The heart is top-normal in size. The pulmonary vascularity is not engorged. The power port catheter tip projects over the distal third of the SVC. The bowel gas pattern in the upper abdomen is unremarkable. The bony thorax exhibits no acute abnormality. IMPRESSION: Smaller moderate-sized right pleural effusion more conspicuous than on the earlier study. Bilateral hypoinflation. Electronically Signed   By: David  Martinique M.D.   On: 08/31/2017 11:03   Dg Abd 2 Views  Result Date: 08/27/2017 CLINICAL DATA:  Nausea and vomiting.  History of colon cancer. EXAM: ABDOMEN - 2 VIEW COMPARISON:  07/11/2017; CT abdomen and pelvis - 08/11/2017; ultrasound-guided paracentesis - 08/26/2017 FINDINGS: Mild to moderate gas distention with several loops of large and small bowel without definitive evidence of enteric obstruction. Several loops of bowel appears somewhat patulous compatible with known extensive peritoneal carcinomatosis. An ostomy overlies the left lower abdomen. No pneumoperitoneum, pneumatosis or portal venous gas Limited visualization of lower thorax demonstrates port a catheter tip overlying the superior cavoatrial junction. No focal airspace opacities. No pleural effusion. No definite acute osseus abnormalities. IMPRESSION: 1. No definite evidence of enteric obstruction. 2. Patulous appearance of several loops of bowel compatible with known history of extensive peritoneal carcinomatosis. Electronically Signed   By: Sandi Mariscal M.D.   On: 08/27/2017 13:28   DISCHARGE EXAMINATION: Vitals:   09/09/17 1200 09/09/17 1203 09/09/17 1207 09/09/17 1218  BP: 106/68 110/82 114/81 111/79  Pulse:      Resp:  Temp:      TempSrc:      SpO2:      Weight:      Height:       General  appearance: alert, cooperative, appears stated age and no distress Resp: Diminished air entry at the bases but mostly clear to auscultation. Cardio: S1-S2 is tachycardic regular. GI: Abdomen is distended.  Tender to palpation.  No rebound rigidity.  Bowel sounds present.  Ostomy noted. Extremities: Swelling of the left leg is noted which is known due to her DVT  DISPOSITION: Home  Discharge Instructions    Call MD for:  difficulty breathing, headache or visual disturbances   Complete by:  As directed    Call MD for:  extreme fatigue   Complete by:  As directed    Call MD for:  persistant dizziness or light-headedness   Complete by:  As directed    Call MD for:  persistant nausea and vomiting   Complete by:  As directed    Call MD for:  severe uncontrolled pain   Complete by:  As directed    Call MD for:  temperature >100.4   Complete by:  As directed    Diet general   Complete by:  As directed    Discharge instructions   Complete by:  As directed    Please be sure to follow-up with your oncologist next week.  Take your medications as prescribed.  You were cared for by a hospitalist during your hospital stay. If you have any questions about your discharge medications or the care you received while you were in the hospital after you are discharged, you can call the unit and asked to speak with the hospitalist on call if the hospitalist that took care of you is not available. Once you are discharged, your primary care physician will handle any further medical issues. Please note that NO REFILLS for any discharge medications will be authorized once you are discharged, as it is imperative that you return to your primary care physician (or establish a relationship with a primary care physician if you do not have one) for your aftercare needs so that they can reassess your need for medications and monitor your lab values. If you do not have a primary care physician, you can call [phone] for a  physician referral.   Face-to-face encounter (required for Medicare/Medicaid patients)   Complete by:  As directed    I Fayrene Helper certify that this patient is under my care and that I, or a nurse practitioner or physician's assistant working with me, had a face-to-face encounter that meets the physician face-to-face encounter requirements with this patient on 08/31/2017. The encounter with the patient was in whole, or in part for the following medical condition(s) which is the primary reason for home health care (List medical condition): metastatic cancer   The encounter with the patient was in whole, or in part, for the following medical condition, which is the primary reason for home health care:  metastatic cancer   I certify that, based on my findings, the following services are medically necessary home health services:   Nursing Physical therapy     Reason for Medically Necessary Home Health Services:  Therapy- Therapeutic Exercises to Increase Strength and Endurance   My clinical findings support the need for the above services:  Pain interferes with ambulation/mobility   Further, I certify that my clinical findings support that this patient is homebound due to:  Pain interferes with  ambulation/mobility   Home Health   Complete by:  As directed    To provide the following care/treatments:   PT RN     Increase activity slowly   Complete by:  As directed    TREATMENT CONDITIONS   Complete by:  As directed    Patient should have CBC & CMP within 7 days prior to chemotherapy administration. NOTIFY MD IF: ANC < 1500, Hemoglobin < 8, PLT < 100,000,  Total Bili > 1.5, Creatinine > 1.5, ALT & AST > 80 or if patient has unstable vital signs: Temperature > 38.5, SBP > 180 or < 90, RR > 30 or HR > 100.   TREATMENT CONDITIONS   Complete by:  As directed    Patient should have magnesium within 7 days prior to chemotherapy administration. NOTIFY MD if magnesium level is < 1.7 or if patient has  unstable vital signs: Temperature > 38.5, SBP > 180 or < 90, RR > 30 or HR > 100.        Allergies as of 09/09/2017   No Known Allergies     Medication List    STOP taking these medications   morphine 15 MG tablet Commonly known as:  MSIR   senna-docusate 8.6-50 MG tablet Commonly known as:  Senokot-S     TAKE these medications   doxycycline 100 MG tablet Commonly known as:  VIBRA-TABS Take 1 tablet (100 mg total) by mouth every 12 (twelve) hours. (follow up with Dr. Benay Spice to determine duration)   ELIQUIS 5 MG Tabs tablet Generic drug:  apixaban Take 5 mg by mouth 2 (two) times daily.   fentaNYL 75 MCG/HR Commonly known as:  DURAGESIC - dosed mcg/hr Place 2 patches (150 mcg total) onto the skin every 3 (three) days.   fluconazole 100 MG tablet Commonly known as:  DIFLUCAN Take 1 tablet (100 mg total) by mouth daily.   haloperidol 0.5 MG tablet Commonly known as:  HALDOL Take 0.5 mg by mouth 2 (two) times daily as needed.   lactulose 10 GM/15ML solution Commonly known as:  CHRONULAC Take 30 mLs (20 g total) by mouth 3 (three) times daily. What changed:    when to take this  reasons to take this   metoCLOPramide 5 MG tablet Commonly known as:  REGLAN Take 1 tablet (5 mg total) by mouth 4 (four) times daily -  before meals and at bedtime.   metoprolol tartrate 25 MG tablet Commonly known as:  LOPRESSOR Take 0.5 tablets (12.5 mg total) by mouth 2 (two) times daily.   ondansetron 4 MG tablet Commonly known as:  ZOFRAN Take 1 tablet (4 mg total) by mouth 3 (three) times daily before meals. Take before meals for 5 days and then as needed for nausea   oxyCODONE 15 MG immediate release tablet Commonly known as:  ROXICODONE Take 1 tablet (15 mg total) by mouth every 4 (four) hours as needed for up to 14 days for breakthrough pain.   polyethylene glycol packet Commonly known as:  MIRALAX / GLYCOLAX Take 17 g by mouth 2 (two) times daily.   potassium  chloride 10 MEQ tablet Commonly known as:  K-DUR Take 10 mEq by mouth daily.   promethazine 12.5 MG tablet Commonly known as:  PHENERGAN Take 1 tablet (12.5 mg total) by mouth every 6 (six) hours as needed for nausea or vomiting.   senna 8.6 MG Tabs tablet Commonly known as:  SENOKOT Take 2 tablets (17.2 mg total) by mouth  2 (two) times daily.        Follow-up Information    Ladell Pier, MD Follow up.   Specialty:  Oncology Why:  on 09/15/17 Contact information: [place] [address] [phone]           TOTAL DISCHARGE TIME: 35 minutes  Bonnielee Haff  [facility] Pager [phone]  09/09/2017, 1:05 PM

## 2017-09-09 NOTE — Procedures (Signed)
Ultrasound-guided therapeutic paracentesis performed yielding 3.3 liters of bloody fluid. No immediate complications.

## 2017-09-09 NOTE — Discharge Instructions (Signed)
Ascites °Ascites is a collection of excess fluid in the abdomen. Ascites can range from mild to severe. It can get worse without treatment. °What are the causes? °Possible causes include: °· Cirrhosis. This is the most common cause of ascites. °· Infection or inflammation in the abdomen. °· Cancer in the abdomen. °· Heart failure. °· Kidney disease. °· Inflammation of the pancreas. °· Clots in the veins of the liver. ° °What are the signs or symptoms? °Signs and symptoms may include: °· A feeling of fullness in your abdomen. This is common. °· An increase in the size of your abdomen or your waist. °· Swelling in your legs. °· Swelling of the scrotum in men. °· Difficulty breathing. °· Abdominal pain. °· Sudden weight gain. ° °If the condition is mild, you may not have symptoms. °How is this diagnosed? °To make a diagnosis, your health care provider will: °· Ask about your medical history. °· Perform a physical exam. °· Order imaging tests, such as an ultrasound or CT scan of your abdomen. ° °How is this treated? °Treatment depends on the cause of the ascites. It may include: °· Taking a pill to make you urinate. This is called a water pill (diuretic pill). °· Strictly reducing your salt (sodium) intake. Salt can cause extra fluid to be kept in the body, and this makes ascites worse. °· Having a procedure to remove fluid from your abdomen (paracentesis). °· Having a procedure to transfer fluid from your abdomen into a vein. °· Having a procedure that connects two of the major veins within your liver and relieves pressure on your liver (TIPS procedure). ° °Ascites may go away or improve with treatment of the condition that caused it. °Follow these instructions at home: °· Keep track of your weight. To do this, weigh yourself at the same time every day and record your weight. °· Keep track of how much you drink and any changes in the amount you urinate. °· Follow any instructions that your health care provider gives  you about how much to drink. °· Try not to eat salty (high-sodium) foods. °· Take medicines only as directed by your health care provider. °· Keep all follow-up visits as directed by your health care provider. This is important. °· Report any changes in your health to your health care provider, especially if you develop new symptoms or your symptoms get worse. °Contact a health care provider if: °· Your gain more than 3 pounds in 3 days. °· Your abdominal size or your waist size increases. °· You have new swelling in your legs. °· The swelling in your legs gets worse. °Get help right away if: °· You develop a fever. °· You develop confusion. °· You develop new or worsening difficulty breathing. °· You develop new or worsening abdominal pain. °· You develop new or worsening swelling in the scrotum (in men). °This information is not intended to replace advice given to you by your health care provider. Make sure you discuss any questions you have with your health care provider. °Document Released: 06/21/2005 Document Revised: 10/29/2015 Document Reviewed: 01/18/2014 °Elsevier Interactive Patient Education © 2018 Elsevier Inc. ° °

## 2017-09-09 NOTE — Progress Notes (Signed)
This CM contacted multiple pharmacies to see where pt would be able to have her fentanyl patches and oxy IR scripts filled. Per [facility], they are able to do at least a partial fill of the patches for her today and have all the OxyIR she needs. Pt given information to have scripts filled at [facility]. Marney Doctor RN,BSN,NCM [phone]

## 2017-09-14 ENCOUNTER — Telehealth: Payer: Self-pay

## 2017-09-14 NOTE — Telephone Encounter (Signed)
Returned call to [facility] with [facility]. Per Dr. Benay Spice, verbal order given for nurse evaluation and social work consult. Threasa Beards also reported that pt does not have PCP on file. Per Dr. Benay Spice we can be consulted for orders needed. Threasa Beards also reported pt BP of 88/43 and diarrhea, stomach cramping and pain that the pt has been experiencing causing her not to eat and drink much. Pt scheduled to see Dr.Sherrill tomorrow. Will make MD aware.

## 2017-09-15 ENCOUNTER — Inpatient Hospital Stay: Payer: Medicaid Other

## 2017-09-15 ENCOUNTER — Encounter: Payer: Self-pay | Admitting: Nurse Practitioner

## 2017-09-15 ENCOUNTER — Inpatient Hospital Stay: Payer: Medicaid Other | Attending: Nurse Practitioner | Admitting: Nurse Practitioner

## 2017-09-15 VITALS — BP 110/86 | HR 125 | Temp 97.8°F | Resp 20 | Ht 63.0 in | Wt 119.5 lb

## 2017-09-15 DIAGNOSIS — C20 Malignant neoplasm of rectum: Secondary | ICD-10-CM | POA: Insufficient documentation

## 2017-09-15 DIAGNOSIS — D701 Agranulocytosis secondary to cancer chemotherapy: Secondary | ICD-10-CM | POA: Insufficient documentation

## 2017-09-15 DIAGNOSIS — Z5112 Encounter for antineoplastic immunotherapy: Secondary | ICD-10-CM | POA: Insufficient documentation

## 2017-09-15 DIAGNOSIS — Z452 Encounter for adjustment and management of vascular access device: Secondary | ICD-10-CM | POA: Insufficient documentation

## 2017-09-15 DIAGNOSIS — C786 Secondary malignant neoplasm of retroperitoneum and peritoneum: Secondary | ICD-10-CM | POA: Diagnosis not present

## 2017-09-15 DIAGNOSIS — Z95828 Presence of other vascular implants and grafts: Secondary | ICD-10-CM

## 2017-09-15 DIAGNOSIS — R109 Unspecified abdominal pain: Secondary | ICD-10-CM | POA: Diagnosis not present

## 2017-09-15 DIAGNOSIS — C787 Secondary malignant neoplasm of liver and intrahepatic bile duct: Secondary | ICD-10-CM | POA: Insufficient documentation

## 2017-09-15 DIAGNOSIS — Z5111 Encounter for antineoplastic chemotherapy: Secondary | ICD-10-CM | POA: Insufficient documentation

## 2017-09-15 DIAGNOSIS — D6481 Anemia due to antineoplastic chemotherapy: Secondary | ICD-10-CM | POA: Diagnosis not present

## 2017-09-15 DIAGNOSIS — R180 Malignant ascites: Secondary | ICD-10-CM | POA: Insufficient documentation

## 2017-09-15 DIAGNOSIS — Z978 Presence of other specified devices: Secondary | ICD-10-CM | POA: Insufficient documentation

## 2017-09-15 LAB — CBC WITH DIFFERENTIAL (CANCER CENTER ONLY)
BASOS ABS: 0 10*3/uL (ref 0.0–0.1)
BASOS PCT: 0 %
EOS ABS: 0 10*3/uL (ref 0.0–0.5)
EOS PCT: 0 %
HCT: 30.5 % — ABNORMAL LOW (ref 34.8–46.6)
Hemoglobin: 9.9 g/dL — ABNORMAL LOW (ref 11.6–15.9)
LYMPHS PCT: 3 %
Lymphs Abs: 0.2 10*3/uL — ABNORMAL LOW (ref 0.9–3.3)
MCH: 27.4 pg (ref 25.1–34.0)
MCHC: 32.3 g/dL (ref 31.5–36.0)
MCV: 85 fL (ref 79.5–101.0)
MONO ABS: 0.8 10*3/uL (ref 0.1–0.9)
Monocytes Relative: 13 %
Neutro Abs: 5.2 10*3/uL (ref 1.5–6.5)
Neutrophils Relative %: 84 %
PLATELETS: 481 10*3/uL — AB (ref 145–400)
RBC: 3.59 MIL/uL — ABNORMAL LOW (ref 3.70–5.45)
RDW: 17.7 % — AB (ref 11.2–14.5)
WBC Count: 6.2 10*3/uL (ref 3.9–10.3)

## 2017-09-15 LAB — CMP (CANCER CENTER ONLY)
ALT: 10 U/L (ref 0–55)
AST: 16 U/L (ref 5–34)
Albumin: 1.8 g/dL — ABNORMAL LOW (ref 3.5–5.0)
Alkaline Phosphatase: 244 U/L — ABNORMAL HIGH (ref 40–150)
Anion gap: 11 (ref 3–11)
BUN: 8 mg/dL (ref 7–26)
CHLORIDE: 93 mmol/L — AB (ref 98–109)
CO2: 26 mmol/L (ref 22–29)
Calcium: 9.4 mg/dL (ref 8.4–10.4)
Creatinine: 0.79 mg/dL (ref 0.60–1.10)
GFR, Est AFR Am: >60 mL/min (ref >60)
Glucose, Bld: 130 mg/dL (ref 70–140)
POTASSIUM: 4.9 mmol/L (ref 3.5–5.1)
Sodium: 130 mmol/L — ABNORMAL LOW (ref 136–145)
Total Bilirubin: 0.3 mg/dL (ref 0.2–1.2)
Total Protein: 6.3 g/dL — ABNORMAL LOW (ref 6.4–8.3)

## 2017-09-15 LAB — MAGNESIUM: MAGNESIUM: 1.7 mg/dL (ref 1.5–2.5)

## 2017-09-15 MED ORDER — PALONOSETRON HCL INJECTION 0.25 MG/5ML
0.2500 mg | Freq: Once | INTRAVENOUS | Status: AC
  Administered 2017-09-15: 0.25 mg via INTRAVENOUS

## 2017-09-15 MED ORDER — SODIUM CHLORIDE 0.9% FLUSH
10.0000 mL | Freq: Once | INTRAVENOUS | Status: AC
  Administered 2017-09-15: 10 mL
  Filled 2017-09-15: qty 10

## 2017-09-15 MED ORDER — DEXAMETHASONE SODIUM PHOSPHATE 10 MG/ML IJ SOLN
INTRAMUSCULAR | Status: AC
  Filled 2017-09-15: qty 1

## 2017-09-15 MED ORDER — ALTEPLASE 2 MG IJ SOLR
INTRAMUSCULAR | Status: AC
  Filled 2017-09-15: qty 2

## 2017-09-15 MED ORDER — ALTEPLASE 2 MG IJ SOLR
2.0000 mg | Freq: Once | INTRAMUSCULAR | Status: AC
  Administered 2017-09-15: 2 mg
  Filled 2017-09-15: qty 2

## 2017-09-15 MED ORDER — SODIUM CHLORIDE 0.9 % IV SOLN
1800.0000 mg/m2 | INTRAVENOUS | Status: AC
  Administered 2017-09-15: 2950 mg via INTRAVENOUS
  Filled 2017-09-15: qty 59

## 2017-09-15 MED ORDER — DEXAMETHASONE SODIUM PHOSPHATE 10 MG/ML IJ SOLN
10.0000 mg | Freq: Once | INTRAMUSCULAR | Status: AC
  Administered 2017-09-15: 10 mg via INTRAVENOUS

## 2017-09-15 MED ORDER — PALONOSETRON HCL INJECTION 0.25 MG/5ML
INTRAVENOUS | Status: AC
  Filled 2017-09-15: qty 5

## 2017-09-15 MED ORDER — SODIUM CHLORIDE 0.9 % IV SOLN
Freq: Once | INTRAVENOUS | Status: AC
  Administered 2017-09-15: 14:00:00 via INTRAVENOUS

## 2017-09-15 MED ORDER — LEUCOVORIN CALCIUM INJECTION 350 MG
200.0000 mg/m2 | Freq: Once | INTRAVENOUS | Status: AC
  Administered 2017-09-15: 330 mg via INTRAVENOUS
  Filled 2017-09-15: qty 16.5

## 2017-09-15 MED ORDER — DEXTROSE 5 % IV SOLN
125.0000 mg/m2 | Freq: Once | INTRAVENOUS | Status: AC
  Administered 2017-09-15: 200 mg via INTRAVENOUS
  Filled 2017-09-15: qty 10

## 2017-09-15 MED ORDER — ATROPINE SULFATE 1 MG/ML IJ SOLN
0.5000 mg | Freq: Once | INTRAMUSCULAR | Status: AC | PRN
  Administered 2017-09-15: 0.5 mg via INTRAVENOUS

## 2017-09-15 MED ORDER — OXYCODONE HCL 10 MG PO TABS: 10.0000 mg | ORAL_TABLET | ORAL | 0 refills | Status: DC | PRN

## 2017-09-15 MED ORDER — SODIUM CHLORIDE 0.9 % IV SOLN
5.7000 mg/kg | Freq: Once | INTRAVENOUS | Status: AC
  Administered 2017-09-15: 300 mg via INTRAVENOUS
  Filled 2017-09-15: qty 15

## 2017-09-15 MED ORDER — ATROPINE SULFATE 1 MG/ML IJ SOLN
INTRAMUSCULAR | Status: AC
  Filled 2017-09-15: qty 1

## 2017-09-15 MED FILL — oxyCODONE HCL 10 MG TABS: 10 | 5 days supply | Qty: 60 | Fill #0

## 2017-09-15 NOTE — Progress Notes (Signed)
Per Ned Card, NP, ok to treat with current tachycardia. Per Dr. Benay Spice, do not adjust home infusion pump. Have patient return at 1:00 pm and waste residual fluorouracil.

## 2017-09-15 NOTE — Progress Notes (Signed)
No blood return from double port. Labs are being drawn peripherally by Lab 2. Ned Card desk nurse called Luiz Iron) and CATHFLOW will be administered. Porsche Cates LPN

## 2017-09-15 NOTE — Progress Notes (Addendum)
[facility] OFFICE PROGRESS NOTE   Diagnosis: Rectal cancer  INTERVAL HISTORY:   Ms. Wixted returns for follow-up.  She completed cycle 2 FOLFIRI/Panitumumab 09/02/2017 while hospitalized.  She was discharged home 09/09/2017.  She reports nausea/vomiting beginning at around 330 this morning.  She has vomited multiple times.  She had some mouth sores following the second cycle of chemotherapy.  The sores have resolved.  She has noted increased liquid output from the ostomy over the past 4-5 days.  She continues to have leg swelling.  She denies bleeding.  She thinks she needs a paracentesis.  She is on a Duragesic patch and takes oxycodone as needed, 3 or 4 times a day, for breakthrough pain.  Objective:  Vital signs in last 24 hours:  Blood pressure 110/86, pulse (!) 125, temperature 97.8 F (36.6 C), temperature source Oral, resp. rate 20, height 5' 3"  (1.6 m), SpO2 99 %.    HEENT: No thrush or ulcers. Resp: Decreased breath sounds at the lower lung fields right greater than left.  No respiratory distress. Cardio: Regular, tachycardic. GI: Abdomen is distended.  Left lower quadrant colostomy.  Firm fullness right abdomen with associated tenderness. Vascular: Pitting edema at the lower legs bilaterally left greater than right. Neuro: Alert and oriented.  Skin: No rash. Port-A-Cath without erythema.  Lab Results:  Lab Results  Component Value Date   WBC 6.2 09/15/2017   HGB 9.5 (L) 09/07/2017   HCT 30.5 (L) 09/15/2017   MCV 85.0 09/15/2017   PLT 481 (H) 09/15/2017   NEUTROABS 5.2 09/15/2017    Imaging:  No results found.  Medications: I have reviewed the patient's current medications.  Assessment/Plan: 1. Rectal cancer July 2018  Colonoscopy 01/25/2017-biopsy showedinvasive moderately differentiated adenocarcinoma with mucinous features.   CT chest/abdomen/pelvis 02/11/2017-very large ulcerated rectal mass with severe wall thickening over the distal 11.2  cm of the rectum extending to the anus with surrounding perirectal adenopathy and pelvic sidewall adenopathy.At least 2 metastatic lesions in the right hepatic lobe.   MRI of the pelvis 02/14/2017-T4b,N2bulky rectal mass with deep infiltrative invasion of the bilateral pelvic sidewalls, presacral space, mesorectal fascia, distal branches of the internal iliac vasculature and sacral plexus. There was abnormal signal intensity along both sciatic nerves possibly representing perineural extension. The mass was noted to approximate the posterior vaginal wall without definite invasion.   PET scan 02/15/2017-colorectal malignancy with mesorectal fascia infiltration and 2 FDG avid hepatic metastases. Regional lymphadenopathy found on prior imaging was not FDG avid on the PET scan.   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  Foundation 1-KRAS/NRASwild-type; microsatellite stable; tumor mutational burden 3; BRCA1 rearrangement intron 2  4 cycles of FOLFIRINOX9/11/2016 through 04/27/2017.   CT abdomen/pelvis 05/06/2017-large rectal mass and known hepatic metastases similar to recent comparison.  Status postpartial course of radiation/Xeloda.   CTs 07/12/2017-large necrotic invasive rectal mass; increased size of known liver metastatic lesions; interval increase in size of peritoneal implants; new development of moderate volume abdominal ascites; gas again present within the anterior rectal wall closely opposing the gas-filled vagina. Rectovaginal fistula not excluded.   Admitted to [facility] 08/12/2017 with abdominal pain and swelling  CT abdomen/pelvis 08/11/2017-large rectal mass that appeared necrotic and with discrete tract extending toward the vagina/perineum; interval development of massive ascites; diffuse caking of the omentum; descending colostomy bulging due to peritoneal metastases; hepatic metastases measuring up to 4.7 cm in the upper right liver; large thrombus in  the left femoral and common femoral veins.  Cycle 1 FOLFIRI/panitumumab 08/18/2017  CT 08/24/2017-enlargement of liver lesions and peritoneal tumor burden  Cycle 2 FOLFIRI/panitumumab 09/02/2017  Cycle 3 FOLFIRI/Panitumumab 09/15/2017 2. Left lower extremity DVT. On Eliquis. 3. Ascitessecondary to #1.  Status post repeat paracentesis procedures 4. Tachycardia-likely secondary to anemia, intravascular depletion, critical illness 5. Anemia secondary to chronic disease, chemotherapy/radiation, and phlebotomy.  Red cell transfusion 08/19/2017, 09/04/2017 6. Leukopenia secondary to chemotherapy 7. Pain secondary to metastatic rectal cancer.  On Duragesic patch with oxycodone for breakthrough pain.    Disposition: Ms. Clere appears unchanged.  She has completed 2 cycles of FOLFIRI/Panitumumab.  Plan to proceed with cycle 3 today as scheduled.  She has ascites secondary to rectal cancer.  We are referring her for a palliative paracentesis.  For pain she will continue the Duragesic patch with oxycodone as needed. She was provided with a new oxycodone prescription at today's visit.   She will return for a follow-up visit in 1 week.   Patient seen with Dr. Benay Spice. 25 minutes were spent face-to-face at today's visit with the majority of that time involved in counseling/coordination of care.      Ned Card ANP/GNP-BC   09/15/2017  12:44 PM  This was a shared visit with Ned Card.  Ms. Digiulio was interviewed and examined.  Her overall status appears stable.  She will complete cycle 3 FOLFIRI/Panitumumab today.  We will arrange for a therapeutic paracentesis. She will return for an office visit in 1 week.  Julieanne Manson, MD

## 2017-09-16 ENCOUNTER — Ambulatory Visit (HOSPITAL_COMMUNITY)
Admission: RE | Admit: 2017-09-16 | Discharge: 2017-09-16 | Disposition: A | Discharge status: Home / Self Care | Payer: Medicaid Other | Source: Ambulatory Visit | Attending: Nurse Practitioner | Admitting: Nurse Practitioner

## 2017-09-16 DIAGNOSIS — R180 Malignant ascites: Secondary | ICD-10-CM | POA: Insufficient documentation

## 2017-09-16 DIAGNOSIS — C20 Malignant neoplasm of rectum: Secondary | ICD-10-CM | POA: Diagnosis not present

## 2017-09-16 MED ORDER — LIDOCAINE HCL 1 % IJ SOLN
INTRAMUSCULAR | Status: AC
  Filled 2017-09-16: qty 20

## 2017-09-16 NOTE — Procedures (Signed)
Ultrasound-guided  therapeutic paracentesis performed yielding 3.1 liters of bloody fluid. No immediate complications.

## 2017-09-17 ENCOUNTER — Inpatient Hospital Stay: Payer: Medicaid Other

## 2017-09-17 VITALS — BP 103/74 | HR 121 | Temp 98.2°F | Resp 19

## 2017-09-17 DIAGNOSIS — C20 Malignant neoplasm of rectum: Secondary | ICD-10-CM

## 2017-09-17 DIAGNOSIS — Z5112 Encounter for antineoplastic immunotherapy: Secondary | ICD-10-CM | POA: Diagnosis not present

## 2017-09-17 MED ORDER — SODIUM CHLORIDE 0.9% FLUSH
10.0000 mL | INTRAVENOUS | Status: DC | PRN
  Administered 2017-09-17: 10 mL
  Filled 2017-09-17: qty 10

## 2017-09-17 MED ORDER — HEPARIN SOD (PORK) LOCK FLUSH 100 UNIT/ML IV SOLN
500.0000 [IU] | Freq: Once | INTRAVENOUS | Status: AC | PRN
  Administered 2017-09-17: 500 [IU]
  Filled 2017-09-17: qty 5

## 2017-09-17 NOTE — Patient Instructions (Signed)
Implanted Port Home Guide An implanted port is a type of central line that is placed under the skin. Central lines are used to provide IV access when treatment or nutrition needs to be given through a person's veins. Implanted ports are used for long-term IV access. An implanted port may be placed because:  You need IV medicine that would be irritating to the small veins in your hands or arms.  You need long-term IV medicines, such as antibiotics.  You need IV nutrition for a long period.  You need frequent blood draws for lab tests.  You need dialysis.  Implanted ports are usually placed in the chest area, but they can also be placed in the upper arm, the abdomen, or the leg. An implanted port has two main parts:  Reservoir. The reservoir is round and will appear as a small, raised area under your skin. The reservoir is the part where a needle is inserted to give medicines or draw blood.  Catheter. The catheter is a thin, flexible tube that extends from the reservoir. The catheter is placed into a large vein. Medicine that is inserted into the reservoir goes into the catheter and then into the vein.  How will I care for my incision site? Do not get the incision site wet. Bathe or shower as directed by your health care provider. How is my port accessed? Special steps must be taken to access the port:  Before the port is accessed, a numbing cream can be placed on the skin. This helps numb the skin over the port site.  Your health care provider uses a sterile technique to access the port. ? Your health care provider must put on a mask and sterile gloves. ? The skin over your port is cleaned carefully with an antiseptic and allowed to dry. ? The port is gently pinched between sterile gloves, and a needle is inserted into the port.  Only "non-coring" port needles should be used to access the port. Once the port is accessed, a blood return should be checked. This helps ensure that the port  is in the vein and is not clogged.  If your port needs to remain accessed for a constant infusion, a clear (transparent) bandage will be placed over the needle site. The bandage and needle will need to be changed every week, or as directed by your health care provider.  Keep the bandage covering the needle clean and dry. Do not get it wet. Follow your health care provider's instructions on how to take a shower or bath while the port is accessed.  If your port does not need to stay accessed, no bandage is needed over the port.  What is flushing? Flushing helps keep the port from getting clogged. Follow your health care provider's instructions on how and when to flush the port. Ports are usually flushed with saline solution or a medicine called heparin. The need for flushing will depend on how the port is used.  If the port is used for intermittent medicines or blood draws, the port will need to be flushed: ? After medicines have been given. ? After blood has been drawn. ? As part of routine maintenance.  If a constant infusion is running, the port may not need to be flushed.  How long will my port stay implanted? The port can stay in for as long as your health care provider thinks it is needed. When it is time for the port to come out, surgery will be   done to remove it. The procedure is similar to the one performed when the port was put in. When should I seek immediate medical care? When you have an implanted port, you should seek immediate medical care if:  You notice a bad smell coming from the incision site.  You have swelling, redness, or drainage at the incision site.  You have more swelling or pain at the port site or the surrounding area.  You have a fever that is not controlled with medicine.  This information is not intended to replace advice given to you by your health care provider. Make sure you discuss any questions you have with your health care provider. Document  Released: 06/21/2005 Document Revised: 11/27/2015 Document Reviewed: 02/26/2013 Elsevier Interactive Patient Education  2017 Elsevier Inc.  

## 2017-09-19 ENCOUNTER — Telehealth: Payer: Self-pay | Admitting: Nurse Practitioner

## 2017-09-19 NOTE — Telephone Encounter (Signed)
Scheduled appt per 3/14 los - called patient with new appt date and time - patient is aware of appt date and time.

## 2017-09-22 ENCOUNTER — Encounter: Payer: Self-pay | Admitting: Nurse Practitioner

## 2017-09-22 ENCOUNTER — Inpatient Hospital Stay (HOSPITAL_COMMUNITY)
Admission: AD | Admit: 2017-09-22 | Discharge: 2017-10-03 | DRG: 374 | Disposition: E | Payer: Medicaid Other | Source: Ambulatory Visit | Attending: Critical Care Medicine | Admitting: Critical Care Medicine

## 2017-09-22 ENCOUNTER — Encounter: Payer: Self-pay | Admitting: General Practice

## 2017-09-22 ENCOUNTER — Inpatient Hospital Stay (HOSPITAL_BASED_OUTPATIENT_CLINIC_OR_DEPARTMENT_OTHER): Payer: Medicaid Other | Admitting: Nurse Practitioner

## 2017-09-22 ENCOUNTER — Telehealth: Payer: Self-pay | Admitting: Nurse Practitioner

## 2017-09-22 VITALS — BP 93/61 | HR 122 | Temp 97.8°F | Resp 18 | Ht 63.0 in

## 2017-09-22 DIAGNOSIS — R112 Nausea with vomiting, unspecified: Secondary | ICD-10-CM | POA: Diagnosis not present

## 2017-09-22 DIAGNOSIS — K5900 Constipation, unspecified: Secondary | ICD-10-CM | POA: Diagnosis present

## 2017-09-22 DIAGNOSIS — D638 Anemia in other chronic diseases classified elsewhere: Secondary | ICD-10-CM | POA: Diagnosis present

## 2017-09-22 DIAGNOSIS — R571 Hypovolemic shock: Secondary | ICD-10-CM | POA: Diagnosis not present

## 2017-09-22 DIAGNOSIS — Z933 Colostomy status: Secondary | ICD-10-CM

## 2017-09-22 DIAGNOSIS — R140 Abdominal distension (gaseous): Secondary | ICD-10-CM | POA: Diagnosis not present

## 2017-09-22 DIAGNOSIS — Z978 Presence of other specified devices: Secondary | ICD-10-CM | POA: Diagnosis not present

## 2017-09-22 DIAGNOSIS — Z6820 Body mass index (BMI) 20.0-20.9, adult: Secondary | ICD-10-CM

## 2017-09-22 DIAGNOSIS — R1010 Upper abdominal pain, unspecified: Secondary | ICD-10-CM | POA: Diagnosis not present

## 2017-09-22 DIAGNOSIS — Z7189 Other specified counseling: Secondary | ICD-10-CM

## 2017-09-22 DIAGNOSIS — I468 Cardiac arrest due to other underlying condition: Secondary | ICD-10-CM | POA: Diagnosis not present

## 2017-09-22 DIAGNOSIS — R160 Hepatomegaly, not elsewhere classified: Secondary | ICD-10-CM | POA: Diagnosis present

## 2017-09-22 DIAGNOSIS — D630 Anemia in neoplastic disease: Secondary | ICD-10-CM | POA: Diagnosis present

## 2017-09-22 DIAGNOSIS — Z515 Encounter for palliative care: Secondary | ICD-10-CM

## 2017-09-22 DIAGNOSIS — R000 Tachycardia, unspecified: Secondary | ICD-10-CM

## 2017-09-22 DIAGNOSIS — C787 Secondary malignant neoplasm of liver and intrahepatic bile duct: Secondary | ICD-10-CM | POA: Diagnosis present

## 2017-09-22 DIAGNOSIS — Z79899 Other long term (current) drug therapy: Secondary | ICD-10-CM

## 2017-09-22 DIAGNOSIS — R180 Malignant ascites: Secondary | ICD-10-CM | POA: Diagnosis present

## 2017-09-22 DIAGNOSIS — R188 Other ascites: Secondary | ICD-10-CM

## 2017-09-22 DIAGNOSIS — J918 Pleural effusion in other conditions classified elsewhere: Secondary | ICD-10-CM | POA: Diagnosis not present

## 2017-09-22 DIAGNOSIS — D6481 Anemia due to antineoplastic chemotherapy: Secondary | ICD-10-CM | POA: Diagnosis present

## 2017-09-22 DIAGNOSIS — J9601 Acute respiratory failure with hypoxia: Secondary | ICD-10-CM

## 2017-09-22 DIAGNOSIS — I2699 Other pulmonary embolism without acute cor pulmonale: Secondary | ICD-10-CM | POA: Diagnosis not present

## 2017-09-22 DIAGNOSIS — R109 Unspecified abdominal pain: Secondary | ICD-10-CM

## 2017-09-22 DIAGNOSIS — M7989 Other specified soft tissue disorders: Secondary | ICD-10-CM | POA: Diagnosis not present

## 2017-09-22 DIAGNOSIS — C786 Secondary malignant neoplasm of retroperitoneum and peritoneum: Secondary | ICD-10-CM | POA: Diagnosis present

## 2017-09-22 DIAGNOSIS — I82890 Acute embolism and thrombosis of other specified veins: Secondary | ICD-10-CM | POA: Diagnosis not present

## 2017-09-22 DIAGNOSIS — D701 Agranulocytosis secondary to cancer chemotherapy: Secondary | ICD-10-CM | POA: Diagnosis present

## 2017-09-22 DIAGNOSIS — Z7901 Long term (current) use of anticoagulants: Secondary | ICD-10-CM

## 2017-09-22 DIAGNOSIS — R0789 Other chest pain: Secondary | ICD-10-CM

## 2017-09-22 DIAGNOSIS — I82210 Acute embolism and thrombosis of superior vena cava: Secondary | ICD-10-CM | POA: Diagnosis not present

## 2017-09-22 DIAGNOSIS — R1013 Epigastric pain: Secondary | ICD-10-CM | POA: Diagnosis not present

## 2017-09-22 DIAGNOSIS — G893 Neoplasm related pain (acute) (chronic): Secondary | ICD-10-CM | POA: Diagnosis present

## 2017-09-22 DIAGNOSIS — Z9889 Other specified postprocedural states: Secondary | ICD-10-CM

## 2017-09-22 DIAGNOSIS — R0600 Dyspnea, unspecified: Secondary | ICD-10-CM

## 2017-09-22 DIAGNOSIS — I82412 Acute embolism and thrombosis of left femoral vein: Secondary | ICD-10-CM | POA: Diagnosis present

## 2017-09-22 DIAGNOSIS — J90 Pleural effusion, not elsewhere classified: Secondary | ICD-10-CM | POA: Diagnosis not present

## 2017-09-22 DIAGNOSIS — I469 Cardiac arrest, cause unspecified: Secondary | ICD-10-CM

## 2017-09-22 DIAGNOSIS — E876 Hypokalemia: Secondary | ICD-10-CM | POA: Diagnosis not present

## 2017-09-22 DIAGNOSIS — C20 Malignant neoplasm of rectum: Secondary | ICD-10-CM | POA: Diagnosis present

## 2017-09-22 DIAGNOSIS — Z95828 Presence of other vascular implants and grafts: Secondary | ICD-10-CM

## 2017-09-22 DIAGNOSIS — R1084 Generalized abdominal pain: Secondary | ICD-10-CM | POA: Diagnosis present

## 2017-09-22 DIAGNOSIS — R402434 Glasgow coma scale score 3-8, 24 hours or more after hospital admission: Secondary | ICD-10-CM | POA: Diagnosis not present

## 2017-09-22 DIAGNOSIS — R0902 Hypoxemia: Secondary | ICD-10-CM

## 2017-09-22 DIAGNOSIS — R64 Cachexia: Secondary | ICD-10-CM | POA: Diagnosis present

## 2017-09-22 DIAGNOSIS — Z86718 Personal history of other venous thrombosis and embolism: Secondary | ICD-10-CM

## 2017-09-22 DIAGNOSIS — K130 Diseases of lips: Secondary | ICD-10-CM | POA: Diagnosis not present

## 2017-09-22 DIAGNOSIS — L89152 Pressure ulcer of sacral region, stage 2: Secondary | ICD-10-CM | POA: Diagnosis present

## 2017-09-22 DIAGNOSIS — I82C11 Acute embolism and thrombosis of right internal jugular vein: Secondary | ICD-10-CM | POA: Diagnosis not present

## 2017-09-22 DIAGNOSIS — E440 Moderate protein-calorie malnutrition: Secondary | ICD-10-CM | POA: Diagnosis present

## 2017-09-22 DIAGNOSIS — K521 Toxic gastroenteritis and colitis: Secondary | ICD-10-CM | POA: Diagnosis not present

## 2017-09-22 DIAGNOSIS — R197 Diarrhea, unspecified: Secondary | ICD-10-CM | POA: Diagnosis not present

## 2017-09-22 DIAGNOSIS — J9691 Respiratory failure, unspecified with hypoxia: Secondary | ICD-10-CM | POA: Diagnosis not present

## 2017-09-22 DIAGNOSIS — Z79891 Long term (current) use of opiate analgesic: Secondary | ICD-10-CM

## 2017-09-22 LAB — CBC WITH DIFFERENTIAL/PLATELET
BASOS ABS: 0 10*3/uL (ref 0.0–0.1)
Basophils Relative: 0 %
Eosinophils Absolute: 0 10*3/uL (ref 0.0–0.7)
Eosinophils Relative: 1 %
HEMATOCRIT: 28.4 % — AB (ref 36.0–46.0)
HEMOGLOBIN: 9 g/dL — AB (ref 12.0–15.0)
LYMPHS PCT: 11 %
Lymphs Abs: 0.3 10*3/uL — ABNORMAL LOW (ref 0.7–4.0)
MCH: 26.7 pg (ref 26.0–34.0)
MCHC: 31.7 g/dL (ref 30.0–36.0)
MCV: 84.3 fL (ref 78.0–100.0)
MONO ABS: 0.3 10*3/uL (ref 0.1–1.0)
MONOS PCT: 10 %
NEUTROS ABS: 2.5 10*3/uL (ref 1.7–7.7)
NEUTROS PCT: 78 %
Platelets: 159 10*3/uL (ref 150–400)
RBC: 3.37 MIL/uL — ABNORMAL LOW (ref 3.87–5.11)
RDW: 16.7 % — AB (ref 11.5–15.5)
WBC: 3.2 10*3/uL — ABNORMAL LOW (ref 4.0–10.5)

## 2017-09-22 LAB — COMPREHENSIVE METABOLIC PANEL
ALBUMIN: 1.8 g/dL — AB (ref 3.5–5.0)
ALK PHOS: 177 U/L — AB (ref 38–126)
ALT: 12 U/L — ABNORMAL LOW (ref 14–54)
AST: 22 U/L (ref 15–41)
Anion gap: 17 — ABNORMAL HIGH (ref 5–15)
BILIRUBIN TOTAL: 0.6 mg/dL (ref 0.3–1.2)
BUN: 7 mg/dL (ref 6–20)
CO2: 23 mmol/L (ref 22–32)
Calcium: 8.5 mg/dL — ABNORMAL LOW (ref 8.9–10.3)
Chloride: 90 mmol/L — ABNORMAL LOW (ref 101–111)
Creatinine, Ser: 0.58 mg/dL (ref 0.44–1.00)
GFR calc Af Amer: >60 mL/min (ref >60)
GLUCOSE: 66 mg/dL (ref 65–99)
POTASSIUM: 3.9 mmol/L (ref 3.5–5.1)
Sodium: 130 mmol/L — ABNORMAL LOW (ref 135–145)
TOTAL PROTEIN: 6 g/dL — AB (ref 6.5–8.1)

## 2017-09-22 MED ORDER — ALTEPLASE 2 MG IJ SOLR
INTRAMUSCULAR | Status: AC
  Filled 2017-09-22: qty 2

## 2017-09-22 MED ORDER — HYDROMORPHONE HCL 2 MG/ML IJ SOLN
INTRAMUSCULAR | Status: AC
  Filled 2017-09-22: qty 1

## 2017-09-22 MED ORDER — POTASSIUM CHLORIDE ER 10 MEQ PO TBCR
10.0000 meq | EXTENDED_RELEASE_TABLET | Freq: Every day | ORAL | Status: DC
  Administered 2017-09-23 – 2017-09-25 (×3): 10 meq via ORAL
  Filled 2017-09-22 (×9): qty 1

## 2017-09-22 MED ORDER — SODIUM CHLORIDE 0.9 % IV SOLN: Freq: Once | INTRAVENOUS | Status: DC

## 2017-09-22 MED ORDER — ALTEPLASE 2 MG IJ SOLR
2.0000 mg | Freq: Once | INTRAMUSCULAR | Status: AC
  Administered 2017-09-22: 2 mg
  Filled 2017-09-22: qty 2

## 2017-09-22 MED ORDER — ONDANSETRON HCL 4 MG/2ML IJ SOLN
8.0000 mg | Freq: Once | INTRAMUSCULAR | Status: AC
  Administered 2017-09-22: 8 mg via INTRAVENOUS

## 2017-09-22 MED ORDER — HYDROMORPHONE HCL 2 MG/ML IJ SOLN
2.0000 mg | Freq: Once | INTRAMUSCULAR | Status: AC
  Administered 2017-09-22: 2 mg via INTRAVENOUS

## 2017-09-22 MED ORDER — FLUCONAZOLE 100 MG PO TABS
100.0000 mg | ORAL_TABLET | Freq: Every day | ORAL | Status: DC
  Administered 2017-09-22 – 2017-09-28 (×6): 100 mg via ORAL
  Filled 2017-09-22 (×7): qty 1

## 2017-09-22 MED ORDER — HYDROCODONE-ACETAMINOPHEN 7.5-325 MG PO TABS
1.0000 | ORAL_TABLET | Freq: Four times a day (QID) | ORAL | Status: DC | PRN
  Administered 2017-09-22 – 2017-09-23 (×3): 1 via ORAL
  Filled 2017-09-22 (×3): qty 1

## 2017-09-22 MED ORDER — ONDANSETRON HCL 4 MG/2ML IJ SOLN
INTRAMUSCULAR | Status: AC
  Filled 2017-09-22: qty 4

## 2017-09-22 MED ORDER — PANTOPRAZOLE SODIUM 40 MG PO TBEC
40.0000 mg | DELAYED_RELEASE_TABLET | Freq: Every day | ORAL | Status: DC
  Administered 2017-09-22 – 2017-09-28 (×6): 40 mg via ORAL
  Filled 2017-09-22 (×7): qty 1

## 2017-09-22 MED ORDER — SODIUM CHLORIDE 0.9 % IV SOLN
INTRAVENOUS | Status: DC
  Administered 2017-09-22 – 2017-09-26 (×6): via INTRAVENOUS

## 2017-09-22 MED ORDER — HYDROMORPHONE HCL 1 MG/ML IJ SOLN
1.0000 mg | INTRAMUSCULAR | Status: DC | PRN
  Administered 2017-09-22 – 2017-09-23 (×5): 1 mg via INTRAVENOUS
  Filled 2017-09-22 (×5): qty 1

## 2017-09-22 MED ORDER — PROMETHAZINE HCL 25 MG/ML IJ SOLN
12.5000 mg | Freq: Four times a day (QID) | INTRAMUSCULAR | Status: DC | PRN
  Administered 2017-09-22 – 2017-09-27 (×16): 12.5 mg via INTRAVENOUS
  Filled 2017-09-22 (×16): qty 1

## 2017-09-22 MED ORDER — APIXABAN 5 MG PO TABS
5.0000 mg | ORAL_TABLET | Freq: Two times a day (BID) | ORAL | Status: DC
  Administered 2017-09-22: 5 mg via ORAL
  Filled 2017-09-22 (×3): qty 1

## 2017-09-22 MED ORDER — HYDROMORPHONE HCL 2 MG/ML IJ SOLN
2.0000 mg | Freq: Once | INTRAMUSCULAR | Status: AC
  Administered 2017-09-22: 2 mg via INTRAVENOUS
  Filled 2017-09-22: qty 1

## 2017-09-22 MED ORDER — SODIUM CHLORIDE 0.9 % IV SOLN
INTRAVENOUS | Status: DC
  Administered 2017-09-22 (×2): via INTRAVENOUS

## 2017-09-22 NOTE — Progress Notes (Signed)
[facility] CSW Progress Notes  CSW met w patient and mother in exam room at clinic.  Patient requested help w completing Advance Directives - "I filled mine out but left them at home."  CSW Dalene Seltzer will assist patient w completing these.  CSW [place] referred patient to [facility]) for outpatient care management.  Also provided [place] [phone] to desk nurse for Dr Benay Spice to complete.  When completed by MD, will submit to [facility] for [facility] evaluation to add additional support in the home.  Edwyna Shell, LCSW Clinical Social Worker Phone:  [phone]

## 2017-09-22 NOTE — Progress Notes (Addendum)
[facility] OFFICE PROGRESS NOTE   Diagnosis: Rectal cancer  INTERVAL HISTORY:   Amber Silva returns as scheduled.  She completed cycle 3 of FOLFIRI/Panitumumab 09/15/2017.  She is having nausea after eating or drinking.  No mouth sores.  She is having loose stools.  She estimates emptying the colostomy 2-3 times a day.  No rash.  No bleeding.  No fever.  She began experiencing chest pain Monday.  Associated shortness of breath.  No cough.  Appetite is poor.  She feels that she needs to be in the hospital.  Objective:  Vital signs in last 24 hours:  Blood pressure 96/79, pulse (!) 127, resp. rate 20, height 5' 3"  (1.6 m), SpO2 100 %.    HEENT: No thrush or ulcers. Resp: Lungs clear bilaterally. Cardio: Regular, tachycardic. GI: Abdomen is markedly distended, tender.  She appears to have ascites.  Liver is palpable about the right abdomen. Vascular: No significant leg edema.  Skin: No rash. Musculoskeletal: Tenderness over the sternum. The cath without erythema.   Lab Results:  Lab Results  Component Value Date   WBC 6.2 09/15/2017   HGB 9.5 (L) 09/07/2017   HCT 30.5 (L) 09/15/2017   MCV 85.0 09/15/2017   PLT 481 (H) 09/15/2017   NEUTROABS 5.2 09/15/2017    Imaging:  No results found.  Medications: I have reviewed the patient's current medications.  Assessment/Plan: 1. Rectal cancer July 2018  Colonoscopy 01/25/2017-biopsy showedinvasive moderately differentiated adenocarcinoma with mucinous features.   CT chest/abdomen/pelvis 02/11/2017-very large ulcerated rectal mass with severe wall thickening over the distal 11.2 cm of the rectum extending to the anus with surrounding perirectal adenopathy and pelvic sidewall adenopathy.At least 2 metastatic lesions in the right hepatic lobe.   MRI of the pelvis 02/14/2017-T4b,N2bulky rectal mass with deep infiltrative invasion of the bilateral pelvic sidewalls, presacral space, mesorectal fascia, distal  branches of the internal iliac vasculature and sacral plexus. There was abnormal signal intensity along both sciatic nerves possibly representing perineural extension. The mass was noted to approximate the posterior vaginal wall without definite invasion.   PET scan 02/15/2017-colorectal malignancy with mesorectal fascia infiltration and 2 FDG avid hepatic metastases. Regional lymphadenopathy found on prior imaging was not FDG avid on the PET scan.   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  Foundation 1-KRAS/NRASwild-type; microsatellite stable; tumor mutational burden 3; BRCA1 rearrangement intron 2  4 cycles of FOLFIRINOX9/11/2016 through 04/27/2017.   CT abdomen/pelvis 05/06/2017-large rectal mass and known hepatic metastases similar to recent comparison.  Status postpartial course of radiation/Xeloda.   CTs 07/12/2017-large necrotic invasive rectal mass; increased size of known liver metastatic lesions; interval increase in size of peritoneal implants; new development of moderate volume abdominal ascites; gas again present within the anterior rectal wall closely opposing the gas-filled vagina. Rectovaginal fistula not excluded.   Admitted to [facility] 08/12/2017 with abdominal pain and swelling  CT abdomen/pelvis 08/11/2017-large rectal mass that appeared necrotic and with discrete tract extending toward the vagina/perineum; interval development of massive ascites; diffuse caking of the omentum; descending colostomy bulging due to peritoneal metastases; hepatic metastases measuring up to 4.7 cm in the upper right liver; large thrombus in the left femoral and common femoral veins.  Cycle 1 FOLFIRI/panitumumab 08/18/2017  CT 08/24/2017-enlargement of liver lesions and peritoneal tumor burden  Cycle 2 FOLFIRI/panitumumab 09/02/2017  Cycle 3 FOLFIRI/Panitumumab 09/15/2017 2. Left lower extremity DVT. On Eliquis. 3. Ascitessecondary to #1. Status post repeat  paracentesis procedures 4. Tachycardia-likely secondary to anemia, intravascular depletion,  critical illness 5. Anemia secondary to chronic disease, chemotherapy/radiation, and phlebotomy. Red cell transfusion 08/19/2017, 09/04/2017 6. Leukopenia secondary to chemotherapy 7. Pain secondary to metastatic rectal cancer.  On Duragesic patch with oxycodone for breakthrough pain.   Disposition: Amber Silva is a 27 year old woman with metastatic rectal cancer.  She has completed 3 cycles of FOLFIRI/Panitumumab.  She presents to the office today for routine follow-up.  She has a poor performance status.  She continues to have abdominal pain and more recently reports significant chest pain.  She is markedly tender over the sternum.  She received a dose of IV Dilaudid in the office with partial improvement in the pain.  We are contacting the [facility] hospitalists for admission.  Patient seen with Dr. Benay Spice.  Ned Card ANP/GNP-BC   09/11/2017  12:28 PM This was insured visit with Ned Card.  Amber Silva has completed 3 cycles of FOLFIRI/panitumumab.  Her pain has not improved.  She presents today with severe pain in the abdomen and anterior chest.  She has tenderness over the sternum.  I suspect the pain is related to metastatic rectal cancer as opposed to an infection or primary cardiopulmonary process.  She requests hospital admission for pain control.  I discussed the poor prognosis with her mother.  She understands Amber Silva's life span may be limited to a few months.  Her clinical status has not improved with the FOLFIRI/panitumumab.  We will need to consider initiating hospice care during this admission.  Recommendations: 1.  Admit for pain control with narcotic analgesics 2.  Palliative therapeutic paracentesis as needed 3.  Palliative care consult to discuss hospice and end-of-life issues  Julieanne Manson, MD

## 2017-09-22 NOTE — H&P (Addendum)
History and Physical    Amber Silva ENI:[id number] DOB: 11-May-1991 DOA: 09/06/2017  PCP: [name]., MD   Patient coming from: Home    Chief Complaint: Abdomen pain, nausea, vomiting  HPI: Amber Silva is a 27 y.o. 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  Patient also reported having severe abdominal pain since last Monday.She started having diarrhea since last Tuesday.  Abdomen pain is generalized.  Patient has poor appetite and weight loss.  Every time she tries to eat, she throws up.   Patient completed cycle 3 of FOLFIRI/Panitumumab on 09/15/2017.  Since then ,she is  having this symptoms of nausea, vomiting and diarrhea.  She has been frequently emptying her colostomy bag.  Denies any bleeding from rectum.  She denies any fever or chills.  Also complains of associated shortness of breath and increased acid reflux. She has history of stage IV rectal cancer with malignant ascites .She also was suspected to have spontaneous bacterial peritonitis on her previous admissions.  Patient also underwent paracentesis on her last admission for her malignant ascites. Patient has also been evaluated by palliative care in the past but she remains as full code and continues to expect cure for her rectal cancer.   ED Course: None  Review of Systems: As per HPI otherwise 10 point review of systems negative.    Past Medical History:  Diagnosis Date  . Ascites   . Bronchitis   . Colon cancer ([place])   . Pregnancy induced hypertension   . S/P colostomy [facility])     Past Surgical History:  Procedure Laterality Date  . COLON SURGERY       reports that she has never smoked. She has never used smokeless tobacco. She reports that she does not drink alcohol or use  drugs.  No Known Allergies  No family history on file.   Prior to Admission medications   Medication Sig Start Date End Date Taking? Authorizing Provider  doxycycline (VIBRA-TABS) 100 MG tablet Take 1 tablet (100 mg total) by mouth every 12 (twelve) hours. (follow up with Dr. Benay Spice to determine duration) 08/23/17 09/03/2017 Yes Elodia Florence., MD  ELIQUIS 5 MG TABS tablet Take 5 mg by mouth 2 (two) times daily. 08/06/17  Yes [provider]  fentaNYL (DURAGESIC - DOSED MCG/HR) 75 MCG/HR Place 2 patches (150 mcg total) onto the skin every 3 (three) days. 09/09/17  Yes Bonnielee Haff, MD  fluconazole (DIFLUCAN) 100 MG tablet Take 1 tablet (100 mg total) by mouth daily. 09/09/17  Yes Bonnielee Haff, MD  metoCLOPramide (REGLAN) 5 MG tablet Take 1 tablet (5 mg total) by mouth 4 (four) times daily -  before meals and at bedtime. 09/09/17  Yes Bonnielee Haff, MD  metoprolol tartrate (LOPRESSOR) 25 MG tablet Take 0.5 tablets (12.5 mg total) by mouth 2 (two) times daily. 08/23/17 09/08/2017 Yes Elodia Florence., MD  Oxycodone HCl 10 MG TABS Take 1-2 tablets (10-20 mg total) by mouth every 4 (four) hours as needed. 09/15/17  Yes Owens Shark, NP  potassium chloride (K-DUR) 10 MEQ tablet Take 10 mEq by mouth daily.   Yes [provider]  promethazine (PHENERGAN) 12.5 MG tablet Take 1 tablet (12.5 mg total) by mouth every 6 (six) hours as needed for nausea or vomiting.  08/23/17  Yes Elodia Florence., MD  haloperidol (HALDOL) 0.5 MG tablet Take 0.5 mg by mouth 2 (two) times daily as needed. 07/20/17   [provider]  lactulose (CHRONULAC) 10 GM/15ML solution Take 30 mLs (20 g total) by mouth 3 (three) times daily. Patient not taking: Reported on 10/01/2017 09/09/17   Bonnielee Haff, MD  ondansetron (ZOFRAN) 4 MG tablet Take 1 tablet (4 mg total) by mouth 3 (three) times daily before meals. Take before meals for 5 days and then as needed for nausea Patient not taking:  Reported on 09/27/2017 09/09/17   Bonnielee Haff, MD  polyethylene glycol [facility]) packet Take 17 g by mouth 2 (two) times daily. Patient not taking: Reported on 09/06/2017 09/09/17   Bonnielee Haff, MD  senna (SENOKOT) 8.6 MG TABS tablet Take 2 tablets (17.2 mg total) by mouth 2 (two) times daily. Patient not taking: Reported on 09/19/2017 09/09/17   Bonnielee Haff, MD    Physical Exam: Vitals:   09/19/2017 1638  BP: 92/64  Pulse: (!) 111  Temp: 97.9 F (36.6 C)  TempSrc: Oral  SpO2: 100%    Constitutional: NAD, calm, comfortable Vitals:   09/26/2017 1638  BP: 92/64  Pulse: (!) 111  Temp: 97.9 F (36.6 C)  TempSrc: Oral  SpO2: 100%   General: Chronically ill, cachectic Eyes: PERRL, lids and conjunctivae normal ENMT: Mucous membranes are dry, Posterior pharynx clear of any exudate or lesions.Normal dentition.  Neck: normal, supple, no masses, no thyromegaly Respiratory: clear to auscultation bilaterally, no wheezing, no crackles. Normal respiratory effort. No accessory muscle use.  Cardiovascular: Tachycardia,Regular rate and rhythm, no murmurs / rubs / gallops. No extremity edema. 2+ pedal pulses. No carotid bruits.  Abdomen: severe generalized tenderness ,ascites, colostomy with Boettger stool ,no masses palpated. Bowel sounds positive.  Musculoskeletal: no clubbing / cyanosis. No joint deformity upper and lower extremities. Good ROM, no contractures. Normal muscle tone.  Skin: no rashes, lesions, ulcers. No induration Neurologic: Generalized weakness, CN 2-12 grossly intact. Sensation intact, DTR normal. Strength 5/5 in all 4.  Psychiatric: Normal judgment and insight. Alert and oriented x 3. Normal mood.    Labs on Admission: I have personally reviewed following labs and imaging studies  CBC: No results for input(s): WBC, NEUTROABS, HGB, HCT, MCV, PLT in the last 168 hours. Basic Metabolic Panel: No results for input(s): NA, K, CL, CO2, GLUCOSE, BUN, CREATININE,  CALCIUM, MG, PHOS in the last 168 hours. GFR: Estimated Creatinine Clearance: 87.4 mL/min (by C-G formula based on SCr of 0.79 mg/dL). Liver Function Tests: No results for input(s): AST, ALT, ALKPHOS, BILITOT, PROT, ALBUMIN in the last 168 hours. No results for input(s): LIPASE, AMYLASE in the last 168 hours. No results for input(s): AMMONIA in the last 168 hours. Coagulation Profile: No results for input(s): INR, PROTIME in the last 168 hours. Cardiac Enzymes: No results for input(s): CKTOTAL, CKMB, CKMBINDEX, TROPONINI in the last 168 hours. BNP (last 3 results) No results for input(s): PROBNP in the last 8760 hours. HbA1C: No results for input(s): HGBA1C in the last 72 hours. CBG: No results for input(s): GLUCAP in the last 168 hours. Lipid Profile: No results for input(s): CHOL, HDL, LDLCALC, TRIG, CHOLHDL, LDLDIRECT in the last 72 hours. Thyroid Function Tests: No results for input(s): TSH, T4TOTAL, FREET4, T3FREE, THYROIDAB in the last 72 hours. Anemia Panel: No results for input(s): VITAMINB12, FOLATE, FERRITIN, TIBC, IRON, RETICCTPCT in the last 72 hours. Urine analysis:    Component Value  Date/Time   COLORURINE YELLOW 08/26/2017 1502   APPEARANCEUR CLEAR 08/26/2017 1502   LABSPEC 1.013 08/26/2017 1502   PHURINE 7.0 08/26/2017 1502   GLUCOSEU NEGATIVE 08/26/2017 1502   HGBUR MODERATE (A) 08/26/2017 1502   BILIRUBINUR NEGATIVE 08/26/2017 1502   KETONESUR 5 (A) 08/26/2017 1502   PROTEINUR NEGATIVE 08/26/2017 1502   UROBILINOGEN >8.0 (H) 02/11/2014 1125   NITRITE NEGATIVE 08/26/2017 1502   LEUKOCYTESUR MODERATE (A) 08/26/2017 1502    Radiological Exams on Admission: No results found.   Assessment/Plan Principal Problem:   Abdominal pain Active Problems:   Malignant ascites   Abdominal distention   Malnutrition of moderate degree   Rectal cancer (HCC)   Nausea & vomiting  Abdominal pain: Secondary to malignant ascites.  Continue pain management.  She was  given IV Dilaudid and oncology office.  We will continue IV Dilaudid here on as needed basis .    Malignant ascites:She might need  paracentesis in near future to alleviate her pain  . Abdomen is distended but not that tense. She had multiple paracentesis in the last few weeks.  The last one being on 09/16/17.  Stage IV rectal cancer: She has  completed cycle 3 of FOLFIRI/Panitumumab on 09/15/2017 as per the oncology note.Dr Benay Spice will see her tomorrow. Patient has very poor prognosis.  As per the previous note, she is still optimistic for cure of her terminal stage cancer.  Palliative care has evaluated her in the past. Will request for palliative care consult for re-discussion of goals of care and pain management. She is S/P Colostomy.  Nausea, vomiting, diarrhea: We will continue antiemetics.  We will continue IV fluids.  Will check for electrolyte imbalance   and supplement as necessary.  She was on bowel regimen at home for constipation, which will be held.  History of DVT:  Has history of left femoral/common femoral veins DVT and was on Eliquis.  We will continue that.  Chest discomfort: She was complaining of chest discomfort during her presentation to the cancer center.  Currently denies any chest pain.  Chest pain most likely musculoskeletal in etiology and she had this problem in the past.  Anemia: Normocytic anemia treated with malignancy.  We will continue to monitor H&H and transfuse as necessary  Moderate protein calorie malnutrition: Nutrition consultation.  Encourage oral intake.  History of sinus tachycardia: She was on metoprolol at home for sinus tachycardia.  Currently on hold for soft blood pressure.      Severity of Illness:    I certify that at the point of admission it is my clinical judgment that the patient will require inpatient hospital care spanning beyond 2 midnights from the point of admission due to high intensity of service, high risk for further  deterioration and high frequency of surveillance required.    DVT prophylaxis: Eliquis Code Status: Full Family Communication: None present at the bedside Consults called: Oncology aware      Shelly Coss MD [facility] Pager [phone]  If 7PM-7AM, please contact night-coverage [email or web address] Password [facility] [place]  09/17/2017, 6:02 PM

## 2017-09-22 NOTE — Telephone Encounter (Signed)
No 3/21 los.

## 2017-09-22 NOTE — Progress Notes (Signed)
Pt reported soreness on sacrum, thinks she may be developing a sore there. Report called to inpatient RN. Pt requested juice but began to vomit after a few sips. APP notified. Order received for Zofran IVpush. Same done, pt reports some improvement in abdominal pain. Pt taken to 3W via wheelchair by nurse tech.

## 2017-09-23 ENCOUNTER — Other Ambulatory Visit: Payer: Self-pay

## 2017-09-23 ENCOUNTER — Other Ambulatory Visit: Payer: Self-pay | Admitting: Radiology

## 2017-09-23 ENCOUNTER — Encounter: Payer: Self-pay | Admitting: General Practice

## 2017-09-23 ENCOUNTER — Encounter (HOSPITAL_COMMUNITY): Payer: Self-pay | Admitting: *Deleted

## 2017-09-23 ENCOUNTER — Inpatient Hospital Stay (HOSPITAL_COMMUNITY): Payer: Medicaid Other

## 2017-09-23 DIAGNOSIS — C787 Secondary malignant neoplasm of liver and intrahepatic bile duct: Secondary | ICD-10-CM

## 2017-09-23 DIAGNOSIS — E440 Moderate protein-calorie malnutrition: Secondary | ICD-10-CM

## 2017-09-23 DIAGNOSIS — C786 Secondary malignant neoplasm of retroperitoneum and peritoneum: Principal | ICD-10-CM

## 2017-09-23 DIAGNOSIS — K521 Toxic gastroenteritis and colitis: Secondary | ICD-10-CM

## 2017-09-23 DIAGNOSIS — C20 Malignant neoplasm of rectum: Secondary | ICD-10-CM

## 2017-09-23 DIAGNOSIS — Z515 Encounter for palliative care: Secondary | ICD-10-CM

## 2017-09-23 DIAGNOSIS — G893 Neoplasm related pain (acute) (chronic): Secondary | ICD-10-CM

## 2017-09-23 LAB — BODY FLUID CELL COUNT WITH DIFFERENTIAL
Eos, Fluid: 1 %
Lymphs, Fluid: 41 %
Monocyte-Macrophage-Serous Fluid: 28 % — ABNORMAL LOW (ref 50–90)
Neutrophil Count, Fluid: 30 % — ABNORMAL HIGH (ref 0–25)
WBC FLUID: 263 uL (ref 0–1000)

## 2017-09-23 MED ORDER — LOPERAMIDE HCL 2 MG PO CAPS: 2.0000 mg | ORAL_CAPSULE | ORAL | Status: DC | PRN

## 2017-09-23 MED ORDER — LIDOCAINE HCL 1 % IJ SOLN
INTRAMUSCULAR | Status: AC
  Filled 2017-09-23: qty 20

## 2017-09-23 MED ORDER — OXYCODONE HCL 5 MG PO TABS: 10.0000 mg | ORAL_TABLET | ORAL | Status: DC | PRN

## 2017-09-23 MED ORDER — HYDROMORPHONE HCL 1 MG/ML IJ SOLN
1.0000 mg | INTRAMUSCULAR | Status: DC | PRN
  Administered 2017-09-23 – 2017-09-27 (×19): 2 mg via INTRAVENOUS
  Administered 2017-09-27: 1 mg via INTRAVENOUS
  Administered 2017-09-27 (×3): 2 mg via INTRAVENOUS
  Administered 2017-09-28: 1 mg via INTRAVENOUS
  Administered 2017-09-28 (×2): 2 mg via INTRAVENOUS
  Filled 2017-09-23: qty 2
  Filled 2017-09-23: qty 1
  Filled 2017-09-23 (×11): qty 2
  Filled 2017-09-23: qty 1
  Filled 2017-09-23 (×12): qty 2

## 2017-09-23 MED ORDER — SODIUM CHLORIDE 0.9% FLUSH: 10.0000 mL | INTRAVENOUS | Status: DC | PRN

## 2017-09-23 MED ORDER — APIXABAN 5 MG PO TABS
5.0000 mg | ORAL_TABLET | Freq: Two times a day (BID) | ORAL | Status: DC
Start: 2017-09-24 — End: 2017-09-26
  Administered 2017-09-24 – 2017-09-25 (×4): 5 mg via ORAL
  Filled 2017-09-23 (×5): qty 1

## 2017-09-23 MED ORDER — FENTANYL 75 MCG/HR TD PT72
150.0000 ug | MEDICATED_PATCH | TRANSDERMAL | Status: DC
  Administered 2017-09-23 – 2017-09-26 (×2): 150 ug via TRANSDERMAL
  Filled 2017-09-23 (×3): qty 2

## 2017-09-23 NOTE — Progress Notes (Signed)
IP PROGRESS NOTE  Subjective:   She was admitted yesterday with severe abdomen and anterior chest pain.  She reports partial improvement in the pain.  She has intermittent nausea/vomiting.  She has diarrhea.  There is mild discomfort associated with a sacral decubitus.  Objective: Vital signs in last 24 hours: Blood pressure (!) 98/56, pulse (!) 112, temperature 97.9 F (36.6 C), temperature source Oral, resp. rate 16, weight 115 lb 15.4 oz (52.6 kg), SpO2 95 %.  Intake/Output from previous day: 03/21 0701 - 03/22 0700 In: 1316.7 [I.V.:1316.7] Out: -   Physical Exam: Cardiac: Regular rate and rhythm, tachycardia Abdomen: Distended, diffuse tenderness, palpable liver edge versus abdominal wall tumor at the right and mid abdomen, colostomy with Nell stool Extremities: Trace edema at the left ankle Skin: Sacral decubitus covered with a dressing  Portacath/PICC-without erythema  Lab Results: Recent Labs    09/19/2017 1844  WBC 3.2*  HGB 9.0*  HCT 28.4*  PLT 159    BMET Recent Labs    09/14/2017 1844  NA 130*  K 3.9  CL 90*  CO2 23  GLUCOSE 66  BUN 7  CREATININE 0.58  CALCIUM 8.5*    Medications: I have reviewed the patient's current medications.  Assessment/Plan: 1. Rectal cancer July 2018  Colonoscopy 01/25/2017-biopsy showedinvasive moderately differentiated adenocarcinoma with mucinous features.   CT chest/abdomen/pelvis 02/11/2017-very large ulcerated rectal mass with severe wall thickening over the distal 11.2 cm of the rectum extending to the anus with surrounding perirectal adenopathy and pelvic sidewall adenopathy.At least 2 metastatic lesions in the right hepatic lobe.   MRI of the pelvis 02/14/2017-T4b,N2bulky rectal mass with deep infiltrative invasion of the bilateral pelvic sidewalls, presacral space, mesorectal fascia, distal branches of the internal iliac vasculature and sacral plexus. There was abnormal signal intensity along both sciatic nerves  possibly representing perineural extension. The mass was noted to approximate the posterior vaginal wall without definite invasion.   PET scan 02/15/2017-colorectal malignancy with mesorectal fascia infiltration and 2 FDG avid hepatic metastases. Regional lymphadenopathy found on prior imaging was not FDG avid on the PET scan.   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  Foundation 1-KRAS/NRASwild-type; microsatellite stable; tumor mutational burden 3; BRCA1 rearrangement intron 2  4 cycles of FOLFIRINOX9/11/2016 through 04/27/2017.   CT abdomen/pelvis 05/06/2017-large rectal mass and known hepatic metastases similar to recent comparison.  Status postpartial course of radiation/Xeloda.   CTs 07/12/2017-large necrotic invasive rectal mass; increased size of known liver metastatic lesions; interval increase in size of peritoneal implants; new development of moderate volume abdominal ascites; gas again present within the anterior rectal wall closely opposing the gas-filled vagina. Rectovaginal fistula not excluded.   Admitted to [facility] 08/12/2017 with abdominal pain and swelling  CT abdomen/pelvis 08/11/2017-large rectal mass that appeared necrotic and with discrete tract extending toward the vagina/perineum; interval development of massive ascites; diffuse caking of the omentum; descending colostomy bulging due to peritoneal metastases; hepatic metastases measuring up to 4.7 cm in the upper right liver; large thrombus in the left femoral and common femoral veins.  Cycle 1 FOLFIRI/panitumumab 08/18/2017  CT 08/24/2017-enlargement of liver lesions and peritoneal tumor burden  Cycle 2 FOLFIRI/panitumumab 09/02/2017  Cycle 3 FOLFIRI/Panitumumab 09/15/2017 2. Left lower extremity DVT. On Eliquis. 3. Ascitessecondary to #1. Status post repeat paracentesis procedures 4. Tachycardia-likely secondary to anemia, intravascular depletion, critical illness 5. Anemia secondary to  chronic disease, chemotherapy/radiation, and phlebotomy. Red cell transfusion 08/19/2017, 09/04/2017 6. Leukopenia secondary to chemotherapy 7. Pain secondary to metastatic rectal  cancer.On Duragesic patch and oxycodone prior to hospital admission 8. Diarrhea-likely secondary to irinotecan  Ms. Howley has advanced metastatic rectal cancer.  She has completed 3 cycles of second line chemotherapy with FOLFIRI/panitumumab.  Her performance status and pain have not improved.  The plan was to deliver a total of 4-5 cycles of therapy prior to a restaging CT evaluation, but we may need to consider scanning her sooner.  If there is CT documentation of disease progression I will recommend comfort care.  She has pain secondary to carcinomatosis and liver metastases.  I will resume the Duragesic patch and oxycodone.  I will increase the Dilaudid dose.  I began discussions regarding comfort/supportive care and hospice with her mother yesterday.  I explained to Ms. Diss that we will need to consider switching to a comfort care approach if her status does not improve over the next few days or if a CT documents disease progression.  She will benefit from continued discussions with the palliative care team.  Recommendations: 1.  Resume Duragesic patch/oxycodone and continue as needed Dilaudid for pain 2.  Therapeutic paracentesis 3.  Add Imodium for diarrhea 4.  Palliative care consult  Please call Oncology as needed over the weekend.  I will see her 09/26/2017 if she is not discharged.  She is scheduled for outpatient follow-up at the Cancer center on 09/29/2017.        LOS: 1 day   Betsy Coder, MD   09/23/2017, 8:07 AM

## 2017-09-23 NOTE — Progress Notes (Signed)
[facility]  Patient Status: Active (receiving services up to time of hospitalization)  [facility] is providing the following services: RN, PT and MSW  If patient discharges after hours, please call [phone].   Amber Silva 09/23/2017, 11:12 AM

## 2017-09-23 NOTE — Progress Notes (Signed)
   09/23/17 1600  Clinical Encounter Type  Visited With Patient  Visit Type Follow-up;Psychological support;Spiritual support  Referral From Social work  Consult/Referral To Chaplain  Spiritual Encounters  Spiritual Needs Emotional;Other (Comment) (Advance Directive )  Stress Factors  Patient Stress Factors Other (Comment);Health changes (Advance Directive)  Advance Directives (For Healthcare)  Does Patient Have a Medical Advance Directive? No  Would patient like information on creating a medical advance directive? No - Patient declined (Patient declined to complete at this time )   I visited with the patient per referral from the [place] worker.  The patient had started an Advance Directive form and this document needed to be notarized.  I visited with the patient to discuss this and she declined to complete the Advance Directive at this time.   Please, contact Spiritual Care for further assistance.   Chaplain Shanon Ace M.Div., [facility]

## 2017-09-23 NOTE — Procedures (Signed)
Ultrasound-guided diagnostic and therapeutic paracentesis performed yielding 3.1 liters of bloody fluid. No immediate complications. A portion of the fluid was sent to the lab for preordered studies.

## 2017-09-23 NOTE — Progress Notes (Deleted)
Initial Nutrition Assessment  DOCUMENTATION CODES:   Severe malnutrition in context of chronic illness  INTERVENTION:   Magic cup TID with meals, each supplement provides 290 kcal and 9 grams of protein  Encourage PO intake  NUTRITION DIAGNOSIS:   Severe Malnutrition related to cancer and cancer related treatments, chronic illness as evidenced by moderate fat depletion, severe muscle depletion, energy intake < or equal to 75% for > or equal to 1 month.  GOAL:   Patient will meet greater than or equal to 90% of their needs  MONITOR:   PO intake, Supplement acceptance, Labs, Weight trends  REASON FOR ASSESSMENT:   Consult Assessment of nutrition requirement/status  ASSESSMENT:   27 yo female admitted 3/21 with severe abdomen/chest pain and N/V/D secondary to advanced metastatic rectal cancer with liver mets (cycle 3 chemo 3/14), malignant ascites, tachycardia -likely secondary to anemia & intravascular depletion, anemia, leukopenia, moderate malnutrition.  Per Oncology Progress Note 3/22  Pt's abdomen and chest pain have improved slightly from yesterday  Pt is still having N/V/D as well as discomfort associated with a sacral decubitus.  Her status is not improving and current recommendation is pain medications, therapeutic paracentesis, and palliative care consult  Spoke with pt who was in significant pain and very tired.   She declined NFPE since her pain is worse s/p paracentesis  She reported continued poor PO since previous hospitalization (<50% usual intake >1.5 months)  She has been eating a little in the hospital-- this morning she ate 1.5 egg/cheese biscuits, but she does not want to eat lunch due to abdominal pain r/t paracentesis, stated, I might try to eat later)  Was asked about ONS, which she declined, but she has been trying cold treats like [place] ice  Pt thinks she has lost further wt.This is supported by medical record.  Weight history indicates  further significant weight loss in the past month (>11.9%); however, weight fluctuations r/t paracentesis treatments will make wt hx objectively unreliable for diagnosis of malnutrition.  Wt Readings from Last 15 Encounters:  09/23/17 115 lb 15.4 oz (52.6 kg)  09/15/17 119 lb 8 oz (54.2 kg)  09/03/17 132 lb 0.9 oz (59.9 kg)  08/18/17 135 lb 12.8 oz (61.6 kg)  07/06/17 131 lb (59.4 kg)  04/17/17 141 lb (64 kg)  03/13/17 158 lb (71.7 kg)  02/12/17 158 lb (71.7 kg)  01/03/17 200 lb (90.7 kg)  12/05/16 200 lb (90.7 kg)  09/11/15 200 lb (90.7 kg)  11/16/12 200 lb (90.7 kg)  03/26/12 205 lb (93 kg)     Medications: fentanyl, protonix, KCl PO, IV NaCl @ 125 ml/hr  Labs: Na 130 L, K/Mg wnl,   NUTRITION - FOCUSED PHYSICAL EXAM:  Nutrition-Focused physical exam completed. Findings are moderate fat depletion, severe muscle depletion, and severe abdominal edema.   Diet Order:  Diet regular Room service appropriate? Yes; Fluid consistency: Thin  EDUCATION NEEDS:   Not appropriate for education at this time  Skin:  Skin Integrity Issues:: Stage II Stage II: buttocks  Last BM:  09/29/2017 colostomy  Height:   Ht Readings from Last 1 Encounters:  09/30/2017 5\' 3"  (1.6 m)    Weight:   Wt Readings from Last 1 Encounters:  09/23/17 115 lb 15.4 oz (52.6 kg)    Ideal Body Weight:  52.3 kg  BMI:  Body mass index is 20.54 kg/m.  Estimated Nutritional Needs:   Kcal:  2100-2300 kcal/day  Protein:  90-100 g/day  Fluid:  Per MD  Edmonia Lynch Dietetic Intern Pager: [phone]

## 2017-09-23 NOTE — Progress Notes (Signed)
PMT no charge note  Consult request received Patient well known to our service  Chart reviewed  I went in to see Amber Silva this afternoon and introduced myself. Patient closed her eyes, stated that she was having severe abdominal pain and wanted me to ask her nurse about pain medication.   Gently re introduced scope of palliative medicine, tried to initiate conversations about her ongoing decline from her malignancy, patient was not amenable to further discussions.   We will attempt a full consult/have further recommendations after we are able to have further conversations with Amber Silva. There is no family at the bedside currently.   Loistine Chance MD [facility] health palliative medicine team [phone]

## 2017-09-23 NOTE — Progress Notes (Signed)
PROGRESS NOTE    Amber Silva  QQV:[id number] DOB: 1990/08/13 DOA: 09/18/2017 PCP: [name]., MD   Brief Narrative: Amber Silva is a 27 y.o. 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  Patient admitted for pain control.    Assessment & Plan:   Principal Problem:   Abdominal pain Active Problems:   Malignant ascites   Abdominal distention   Malnutrition of moderate degree   Rectal cancer (HCC)   Nausea & vomiting   Sinus tachycardia   Abdominal pain: Secondary to malignant ascites.  Continue pain management.  She was given IV Dilaudid and oncology office.  We will continue IV Dilaudid here on as needed basis .    Malignant ascites:She will need paracentesis today to alleviate her pain  .Abdomen is distended but not that tense. She had multiple paracentesis in the last few weeks.  The last one being on 09/16/17. We will follow up fluid analysis.  Stage IV rectal cancer:She has  completed cycle 3 of FOLFIRI/Panitumumab on 09/15/2017 as per the oncology note.Dr Benay Spice will see her tomorrow. Patient has very poor prognosis.  As per the previous note, she is still optimistic for cure of her terminal stage cancer.  Palliative care has evaluated her in the past. Will request for palliative care consult for re-discussion of goals of care and pain management. She is S/P Colostomy.  Nausea, vomiting, diarrhea: We will continue antiemetics.  We will continue IV fluids.  Will check for electrolyte imbalance   and supplement as necessary.  She was on bowel regimen at home for constipation, which will be held.  History of DVT:  Has history of left femoral/common femoral veins DVT and was on Eliquis.  We will continue that.  Chest discomfort: She was  complaining of chest discomfort during her presentation to the cancer center.  Currently denies any chest pain.  Chest pain most likely musculoskeletal in etiology and she had this problem in the past.  Anemia: Normocytic anemia treated with malignancy.  We will continue to monitor H&H and transfuse as necessary  Moderate protein calorie malnutrition: Nutrition consultation.  Encourage oral intake.  History of sinus tachycardia: She was on metoprolol at home for sinus tachycardia.  Currently on hold for soft blood pressure.      DVT prophylaxis: Eliquis Code Status: Full Family Communication: None present at the bedside Disposition Plan: Home after resolution of pain   Consultants: Oncology, palliative care  Procedures: Paracentesis on 09/23/17  Antimicrobials:None  Subjective: Patient seen and examined the bedside this morning.  Still complains of abdomen pain but it is much better than yesterday.  Objective: Vitals:   09/23/17 1231 09/23/17 1237 09/23/17 1243 09/23/17 1247  BP: 102/79 100/84 99/81 101/85  Pulse:      Resp:      Temp:      TempSrc:      SpO2:      Weight:        Intake/Output Summary (Last 24 hours) at 09/23/2017 1251 Last data filed at 09/23/2017 0500 Gross per 24 hour  Intake 1316.67 ml  Output -  Net 1316.67 ml   Filed Weights   09/23/17 0618  Weight: 52.6 kg (115 lb 15.4 oz)    Examination:  General exam: Not in obvious distress,thin/cachetic HEENT:PERRL,Oral mucosa moist, Ear/Nose  normal on gross exam Respiratory system: Bilateral equal air entry, normal vesicular breath sounds, no wheezes or crackles  Cardiovascular system: Sinus tachycardia,S1 & S2 heard, RRR. No JVD, murmurs, rubs, gallops or clicks. No pedal edema. Gastrointestinal system: Abdomen is distended, soft and extremely tender.  Hepatomegaly. Normal bowel sounds heard.  Colostomy Central nervous system: Alert and oriented. No focal neurological deficits. Extremities: No  edema, no clubbing ,no cyanosis, distal peripheral pulses palpable. Skin: No rashes, lesions or ulcers,no icterus ,no pallor MSK: Wasted muscles  Psychiatry: Judgement and insight appear normal. Mood & affect appropriate.     Data Reviewed: I have personally reviewed following labs and imaging studies  CBC: Recent Labs  Lab 09/09/2017 1844  WBC 3.2*  NEUTROABS 2.5  HGB 9.0*  HCT 28.4*  MCV 84.3  PLT 419   Basic Metabolic Panel: Recent Labs  Lab 09/21/2017 1844  NA 130*  K 3.9  CL 90*  CO2 23  GLUCOSE 66  BUN 7  CREATININE 0.58  CALCIUM 8.5*   GFR: Estimated Creatinine Clearance: 87.4 mL/min (by C-G formula based on SCr of 0.58 mg/dL). Liver Function Tests: Recent Labs  Lab 09/02/2017 1844  AST 22  ALT 12*  ALKPHOS 177*  BILITOT 0.6  PROT 6.0*  ALBUMIN 1.8*   No results for input(s): LIPASE, AMYLASE in the last 168 hours. No results for input(s): AMMONIA in the last 168 hours. Coagulation Profile: No results for input(s): INR, PROTIME in the last 168 hours. Cardiac Enzymes: No results for input(s): CKTOTAL, CKMB, CKMBINDEX, TROPONINI in the last 168 hours. BNP (last 3 results) No results for input(s): PROBNP in the last 8760 hours. HbA1C: No results for input(s): HGBA1C in the last 72 hours. CBG: No results for input(s): GLUCAP in the last 168 hours. Lipid Profile: No results for input(s): CHOL, HDL, LDLCALC, TRIG, CHOLHDL, LDLDIRECT in the last 72 hours. Thyroid Function Tests: No results for input(s): TSH, T4TOTAL, FREET4, T3FREE, THYROIDAB in the last 72 hours. Anemia Panel: No results for input(s): VITAMINB12, FOLATE, FERRITIN, TIBC, IRON, RETICCTPCT in the last 72 hours. Sepsis Labs: No results for input(s): PROCALCITON, LATICACIDVEN in the last 168 hours.  No results found for this or any previous visit (from the past 240 hour(s)).       Radiology Studies: No results found.      Scheduled Meds: . [START ON 09/24/2017] apixaban  5 mg Oral  BID  . fentaNYL  150 mcg Transdermal Q72H  . fluconazole  100 mg Oral Daily  . lidocaine      . pantoprazole  40 mg Oral Daily  . potassium chloride  10 mEq Oral Daily   Continuous Infusions: . sodium chloride 125 mL/hr at 09/17/2017 1828     LOS: 1 day    Time spent: 25 mins.More than 50% of that time was spent in counseling and/or coordination of care.      Shelly Coss, MD [facility] Pager [phone]  If 7PM-7AM, please contact night-coverage [email or web address] Password [facility] 09/23/2017, 12:51 PM

## 2017-09-23 NOTE — Progress Notes (Signed)
[place] CSW Progress Note  Jacquetta from [facility] [place] called, confirmed that they had received [place] referral.  Rn will review and contact inpatient CSW Sharren Bridge for confirmation of need for services.  Edwyna Shell, LCSW Clinical Social Worker Phone:  [phone]

## 2017-09-23 NOTE — Progress Notes (Signed)
Initial Nutrition Assessment  DOCUMENTATION CODES:   Severe malnutrition in context of chronic illness  INTERVENTION:  Magic cup TID with meals, each supplement provides 290 kcal and 9 grams of protein  Encourage PO intake   NUTRITION DIAGNOSIS:   Severe Malnutrition related to cancer and cancer related treatments, chronic illness as evidenced by moderate fat depletion, severe muscle depletion, energy intake < or equal to 75% for > or equal to 1 month.    GOAL:   Patient will meet greater than or equal to 90% of their needs   MONITOR:   PO intake, Supplement acceptance, Labs, Weight trends  REASON FOR ASSESSMENT:   Consult Assessment of nutrition requirement/status  ASSESSMENT:   27 yo female admitted 3/21 with severe abdomen/chest pain and N/V/D secondary to advanced metastatic rectal cancer with liver mets (cycle 3 chemo 3/14), malignant ascites, tachycardia -likely secondary to anemia & intravascular depletion, anemia, leukopenia, moderate malnutrition.  Per Oncology Progress Note 3/22  Pt's abdomen and chest pain have improved slightly from yesterday  Pt is still having N/V/D as well as discomfort associated with a sacral decubitus.  Her status is not improving and current recommendation is pain medications, therapeutic paracentesis, and palliative care consult  Spoke with pt who was in significant pain and very tired.   She reported continued poor PO since previous hospitalization (<50% usual intake >1.5 months)  She has been eating a little in the hospital-- this morning she ate 1.5 egg/cheese biscuits, but she does not want to eat lunch due to abdominal pain r/t paracentesis, stated, I might try to eat later)  Was asked about ONS, which she declined, but she has been trying cold treats like [place] ice  Pt thinks she has lost further wt.This is supported by medical record.  Weight history indicates further significant weight loss in the past month  (>11.9%); however, weight fluctuations r/t paracentesis treatments will make wt hx objectively unreliable for diagnosis of malnutrition.     Wt Readings from Last 15 Encounters:  09/23/17 115 lb 15.4 oz (52.6 kg)  09/15/17 119 lb 8 oz (54.2 kg)  09/03/17 132 lb 0.9 oz (59.9 kg)  08/18/17 135 lb 12.8 oz (61.6 kg)  07/06/17 131 lb (59.4 kg)  04/17/17 141 lb (64 kg)  03/13/17 158 lb (71.7 kg)  02/12/17 158 lb (71.7 kg)  01/03/17 200 lb (90.7 kg)  12/05/16 200 lb (90.7 kg)  09/11/15 200 lb (90.7 kg)  11/16/12 200 lb (90.7 kg)  03/26/12 205 lb (93 kg)     Medications: fentanyl, protonix, KCl PO, IV NaCl @ 125 ml/hr  Labs: Na 130 L, K/Mg wnl,      NUTRITION - FOCUSED PHYSICAL EXAM:    Most Recent Value  Orbital Region  Moderate depletion  Upper Arm Region  Moderate depletion  Thoracic and Lumbar Region  Severe depletion  Buccal Region  Mild depletion  Temple Region  Severe depletion  Clavicle Bone Region  Severe depletion  Clavicle and Acromion Bone Region  Severe depletion  Scapular Bone Region  Unable to assess  Dorsal Hand  Severe depletion  Patellar Region  Severe depletion  Anterior Thigh Region  Moderate depletion  Posterior Calf Region  Moderate depletion  Edema (RD Assessment)  None       Diet Order:  Diet regular Room service appropriate? Yes; Fluid consistency: Thin  EDUCATION NEEDS:   Not appropriate for education at this time  Skin:  Skin Integrity Issues:: Stage II Stage II: buttocks  Last BM:  09/24/2017 colostomy  Height:   Ht Readings from Last 1 Encounters:  09/13/2017 5\' 3"  (1.6 m)    Weight:   Wt Readings from Last 1 Encounters:  09/23/17 115 lb 15.4 oz (52.6 kg)    Ideal Body Weight:  52.3 kg  BMI:  Body mass index is 20.54 kg/m.  Estimated Nutritional Needs:   Kcal:  2100-2300 kcal/day  Protein:  90-100 g/day  Fluid:  Per MD     Edmonia Lynch Dietetic Intern Pager: [phone]

## 2017-09-24 ENCOUNTER — Other Ambulatory Visit: Payer: Self-pay | Admitting: Oncology

## 2017-09-24 ENCOUNTER — Other Ambulatory Visit: Payer: Self-pay

## 2017-09-24 ENCOUNTER — Inpatient Hospital Stay (HOSPITAL_COMMUNITY): Payer: Medicaid Other

## 2017-09-24 LAB — CBC WITH DIFFERENTIAL/PLATELET
BASOS PCT: 0 %
Basophils Absolute: 0 10*3/uL (ref 0.0–0.1)
Basophils Absolute: 0 10*3/uL (ref 0.0–0.1)
Basophils Relative: 0 %
EOS ABS: 0 10*3/uL (ref 0.0–0.7)
EOS PCT: 0 %
EOS PCT: 0 %
Eosinophils Absolute: 0 10*3/uL (ref 0.0–0.7)
HCT: 28.4 % — ABNORMAL LOW (ref 36.0–46.0)
HEMATOCRIT: 32.5 % — AB (ref 36.0–46.0)
HEMOGLOBIN: 9.9 g/dL — AB (ref 12.0–15.0)
Hemoglobin: 8.8 g/dL — ABNORMAL LOW (ref 12.0–15.0)
LYMPHS ABS: 0.6 10*3/uL — AB (ref 0.7–4.0)
LYMPHS ABS: 0.5 10*3/uL — AB (ref 0.7–4.0)
Lymphocytes Relative: 4 %
Lymphocytes Relative: 5 %
MCH: 26.9 pg (ref 26.0–34.0)
MCH: 27.2 pg (ref 26.0–34.0)
MCHC: 30.5 g/dL (ref 30.0–36.0)
MCHC: 31 g/dL (ref 30.0–36.0)
MCV: 88.3 fL (ref 78.0–100.0)
MCV: 87.9 fL (ref 78.0–100.0)
MONO ABS: 0.6 10*3/uL (ref 0.1–1.0)
Monocytes Absolute: 0.7 10*3/uL (ref 0.1–1.0)
Monocytes Relative: 5 %
Monocytes Relative: 6 %
NEUTROS ABS: 13.4 10*3/uL — AB (ref 1.7–7.7)
NEUTROS PCT: 89 %
Neutro Abs: 9.2 10*3/uL — ABNORMAL HIGH (ref 1.7–7.7)
Neutrophils Relative %: 91 %
PLATELETS: 200 10*3/uL (ref 150–400)
Platelets: 209 10*3/uL (ref 150–400)
RBC: 3.68 MIL/uL — ABNORMAL LOW (ref 3.87–5.11)
RBC: 3.23 MIL/uL — ABNORMAL LOW (ref 3.87–5.11)
RDW: 17.5 % — ABNORMAL HIGH (ref 11.5–15.5)
RDW: 17.3 % — AB (ref 11.5–15.5)
WBC Morphology: INCREASED
WBC: 14.7 10*3/uL — ABNORMAL HIGH (ref 4.0–10.5)
WBC: 10.3 10*3/uL (ref 4.0–10.5)

## 2017-09-24 LAB — BASIC METABOLIC PANEL
Anion gap: 16 — ABNORMAL HIGH (ref 5–15)
BUN: 8 mg/dL (ref 6–20)
CO2: 21 mmol/L — AB (ref 22–32)
CREATININE: 0.73 mg/dL (ref 0.44–1.00)
Calcium: 8.4 mg/dL — ABNORMAL LOW (ref 8.9–10.3)
Chloride: 94 mmol/L — ABNORMAL LOW (ref 101–111)
GFR calc non Af Amer: >60 mL/min (ref >60)
Glucose, Bld: 111 mg/dL — ABNORMAL HIGH (ref 65–99)
Potassium: 4.4 mmol/L (ref 3.5–5.1)
Sodium: 131 mmol/L — ABNORMAL LOW (ref 135–145)

## 2017-09-24 MED ORDER — METOPROLOL TARTRATE 25 MG PO TABS
12.5000 mg | ORAL_TABLET | Freq: Two times a day (BID) | ORAL | Status: DC
  Administered 2017-09-24 (×2): 12.5 mg via ORAL
  Filled 2017-09-24 (×3): qty 1

## 2017-09-24 MED ORDER — ALBUMIN HUMAN 5 % IV SOLN
12.5000 g | Freq: Four times a day (QID) | INTRAVENOUS | Status: AC
  Administered 2017-09-24 – 2017-09-25 (×2): 12.5 g via INTRAVENOUS
  Filled 2017-09-24 (×2): qty 250

## 2017-09-24 MED ORDER — HYDROMORPHONE HCL 2 MG PO TABS: 2.0000 mg | ORAL_TABLET | ORAL | Status: DC | PRN

## 2017-09-24 MED ORDER — METOPROLOL TARTRATE 5 MG/5ML IV SOLN: 2.5000 mg | Freq: Four times a day (QID) | INTRAVENOUS | Status: DC | PRN

## 2017-09-24 MED ORDER — ALTEPLASE 2 MG IJ SOLR
2.0000 mg | Freq: Once | INTRAMUSCULAR | Status: AC
  Administered 2017-09-24: 2 mg
  Filled 2017-09-24: qty 2

## 2017-09-24 MED ORDER — SODIUM CHLORIDE 0.9 % IV BOLUS (SEPSIS)
1000.0000 mL | Freq: Once | INTRAVENOUS | Status: AC
  Administered 2017-09-24: 1000 mL via INTRAVENOUS

## 2017-09-24 NOTE — Consult Note (Signed)
Consultation Note Date: 09/24/2017   Amber Silva Name: Amber Silva  DOB: 09-Feb-1991  MRN: [id number]  Age / Sex: 27 y.o., female  PCP: [name]., MD Referring Physician: Shelly Coss, MD  Reason for Consultation: Establishing goals of care and Pain control  HPI/Amber Silva Profile: 27 y.o. female admitted on 09/04/2017    Amber Silva a 27 y.o.femalewith medical history significant ofstage IV rectal cancer with metastatic disease, ascites,whowas recently discharged on 09/09/17 was sent with a direct admission from her oncologist office today for management of abdominal pain, nausea,vomiting and diarrhea.Amber Silva admitted for pain control.   Clinical Assessment and Goals of Care:  Amber Silva is resting in bed. She complains of pain and discomfort around her ostomy site. This is examined. She has tenderness and erythema around her ostomy site. Amber Silva also complains of back pain. She is awake and alert and resting in bed. There is no family present at the bedside.  Amber Silva does not wish to go into discussions regarding the serious and incurable nature of her illness. She states that her chemotherapy schedule for next week. She states this is a little state I'm going through but I'll get better.  See additional recommendations below.  NEXT OF KIN  mother Has 2 young kids.   SUMMARY OF RECOMMENDATIONS    full code, full scope.  Pain management: Agree with TDF, Amber Silva with ongoing IV Dilaudid needs, will D/C Oxy IR and start PO Dilaudid. Will leave IV Dilaudid PRN for now.  Continue current scope of care for now. Amber Silva remains resistant to any discussions about the serious incurable nature of her illness. Will request ostomy/wound RN to assess Amber Silva's ostomy- has erythema and foul smelling odor.  K pad for back pain.  Code Status/Advance Care Planning:  Full code    Symptom  Management:    as above   Palliative Prophylaxis:   Delirium Protocol  Additional Recommendations (Limitations, Scope, Preferences):  Full Scope Treatment  Psycho-social/Spiritual:   Desire for further Chaplaincy support:yes  Additional Recommendations: Education on Hospice  Prognosis:   Unable to determine  Discharge Planning: To Be Determined      Primary Diagnoses: Present on Admission: . Malignant ascites . Abdominal distention . Rectal cancer ([place]) . Malnutrition of moderate degree . Abdominal pain   I have reviewed the medical record, interviewed the Amber Silva and family, and examined the Amber Silva. The following aspects are pertinent.  Past Medical History:  Diagnosis Date  . Ascites   . Bronchitis   . Colon cancer ([place])   . Pregnancy induced hypertension   . S/P colostomy [facility])    Social History   Socioeconomic History  . Marital status: Single    Spouse name: Not on file  . Number of children: Not on file  . Years of education: Not on file  . Highest education level: Not on file  Occupational History  . Not on file  Social Needs  . Financial resource strain: Not on file  . Food insecurity:    Worry: Not  on file    Inability: Not on file  . Transportation needs:    Medical: Not on file    Non-medical: Not on file  Tobacco Use  . Smoking status: Never Smoker  . Smokeless tobacco: Never Used  Substance and Sexual Activity  . Alcohol use: No  . Drug use: No  . Sexual activity: Yes    Birth control/protection: None  Lifestyle  . Physical activity:    Days per week: Not on file    Minutes per session: Not on file  . Stress: Not on file  Relationships  . Social connections:    Talks on phone: Not on file    Gets together: Not on file    Attends religious service: Not on file    Active member of club or organization: Not on file    Attends meetings of clubs or organizations: Not on file    Relationship status: Not on file  Other Topics  Concern  . Not on file  Social History Narrative  . Not on file   History reviewed. No pertinent family history. Scheduled Meds: . apixaban  5 mg Oral BID  . fentaNYL  150 mcg Transdermal Q72H  . fluconazole  100 mg Oral Daily  . pantoprazole  40 mg Oral Daily  . potassium chloride  10 mEq Oral Daily   Continuous Infusions: . sodium chloride 125 mL/hr at 09/24/17 0747   PRN Meds:.HYDROmorphone (DILAUDID) injection, HYDROmorphone, loperamide, promethazine, sodium chloride flush Medications Prior to Admission:  Prior to Admission medications   Medication Sig Start Date End Date Taking? Authorizing Provider  ELIQUIS 5 MG TABS tablet Take 5 mg by mouth 2 (two) times daily. 08/06/17  Yes [provider]  fentaNYL (DURAGESIC - DOSED MCG/HR) 75 MCG/HR Place 2 patches (150 mcg total) onto the skin every 3 (three) days. 09/09/17  Yes Bonnielee Haff, MD  fluconazole (DIFLUCAN) 100 MG tablet Take 1 tablet (100 mg total) by mouth daily. 09/09/17  Yes Bonnielee Haff, MD  metoCLOPramide (REGLAN) 5 MG tablet Take 1 tablet (5 mg total) by mouth 4 (four) times daily -  before meals and at bedtime. 09/09/17  Yes Bonnielee Haff, MD  metoprolol tartrate (LOPRESSOR) 25 MG tablet Take 0.5 tablets (12.5 mg total) by mouth 2 (two) times daily. 08/23/17 09/26/2017 Yes Elodia Florence., MD  Oxycodone HCl 10 MG TABS Take 1-2 tablets (10-20 mg total) by mouth every 4 (four) hours as needed. 09/15/17  Yes Owens Shark, NP  potassium chloride (K-DUR) 10 MEQ tablet Take 10 mEq by mouth daily.   Yes [provider]  promethazine (PHENERGAN) 12.5 MG tablet Take 1 tablet (12.5 mg total) by mouth every 6 (six) hours as needed for nausea or vomiting. 08/23/17  Yes Elodia Florence., MD  haloperidol (HALDOL) 0.5 MG tablet Take 0.5 mg by mouth 2 (two) times daily as needed. 07/20/17   [provider]  lactulose (CHRONULAC) 10 GM/15ML solution Take 30 mLs (20 g total) by mouth 3 (three) times  daily. Amber Silva not taking: Reported on 09/27/2017 09/09/17   Bonnielee Haff, MD  ondansetron (ZOFRAN) 4 MG tablet Take 1 tablet (4 mg total) by mouth 3 (three) times daily before meals. Take before meals for 5 days and then as needed for nausea Amber Silva not taking: Reported on 09/23/2017 09/09/17   Bonnielee Haff, MD  polyethylene glycol [facility] Floria Raveling) packet Take 17 g by mouth 2 (two) times daily. Amber Silva not taking: Reported on 09/03/2017  09/09/17   Bonnielee Haff, MD  senna (SENOKOT) 8.6 MG TABS tablet Take 2 tablets (17.2 mg total) by mouth 2 (two) times daily. Amber Silva not taking: Reported on 09/09/2017 09/09/17   Bonnielee Haff, MD   No Known Allergies Review of Systems +pain in abdomen.   Physical Exam Awake alert S1-S2 Clear breath sounds Ostomy has erythema and tenderness No edema Thin, cachectic  Vital Signs: BP 103/71 (BP Location: Right Arm)   Pulse (!) 134   Temp 97.6 F (36.4 C) (Oral)   Resp 16   Ht 5' 2.99" (1.6 m)   Wt 52.6 kg (115 lb 15.4 oz)   SpO2 94%   BMI 20.55 kg/m  Pain Scale: 0-10   Pain Score: Asleep   SpO2: SpO2: 94 % O2 Device:SpO2: 94 % O2 Flow Rate: .   IO: Intake/output summary:   Intake/Output Summary (Last 24 hours) at 09/24/2017 1129 Last data filed at 09/23/2017 2028 Gross per 24 hour  Intake 270 ml  Output -  Net 270 ml    LBM: Last BM Date: 09/24/17 Baseline Weight: Weight: 52.6 kg (115 lb 15.4 oz) Most recent weight: Weight: 52.6 kg (115 lb 15.4 oz)     Palliative Assessment/Data:   PPS 30%  Time In:  10 Time Out:  11 Time Total:  60 Greater than 50%  of this time was spent counseling and coordinating care related to the above assessment and plan.  Signed by: Loistine Chance, MD  [phone]  Please contact Palliative Medicine Team phone at [phone] for questions and concerns.  For individual provider: See Shea Evans

## 2017-09-24 NOTE — Progress Notes (Signed)
Pt. heart rate remains elevated 138-140. Pt. Denies complaints of chest pain, states dizziness has improved slightly, and no shortness of breath noted. MD paged x 2, new orders received.

## 2017-09-24 NOTE — Progress Notes (Signed)
MD paged shortly after shift change. Patient per day shift appears more swollen in face, chest and arms compared to her baseline. Her BP has remained low today and heart rate as high as 140's.  Patient has no complaints at this time but will be transferred to stepdown for closer monitoring per on call hospitalist. Report was given to ICU RN. Roderick Pee

## 2017-09-24 NOTE — Progress Notes (Addendum)
PROGRESS NOTE    Amber Silva  VEH:[id number] DOB: 04/13/1991 DOA: 09/05/2017 PCP: [name]., MD   Brief Narrative: Amber Silva is a 27 y.o. 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  Patient admitted for pain control.    Assessment & Plan:   Principal Problem:   Abdominal pain Active Problems:   Malignant ascites   Abdominal distention   Malnutrition of moderate degree   Rectal cancer (HCC)   Nausea & vomiting   Sinus tachycardia   Encounter for palliative care   Abdominal pain: Secondary to malignant ascites.  Continue pain management.    Started on oral Dilaudid today.  Will taper and discontinue IV Dilaudid.Pain is better today.    Malignant ascites: Underwent paracentesis on 09/23/17 with removal of 3.1 L of bloody ascites fluid .Abdomen is distended,tender  but not that tense this morning. She had multiple paracentesis in the last few weeks.   Stage IV rectal cancer:She has  completed cycle 3 of FOLFIRI/Panitumumab on 09/15/2017 as per the oncology note.Dr Benay Spice following. Patient has very poor prognosis.  She is still optimistic for cure of her terminal stage cancer.  Palliative care following but discussion regarding goals of care /comfort measures has not progressed because patient still wants everything to be done . She is S/P Colostomy.  Nausea, vomiting, diarrhea: We will continue antiemetics.  We will continue IV fluids.  Will check for electrolyte imbalance   and supplement as necessary.  She was on bowel regimen at home for constipation, which will be held.  History of DVT:  Has history of left femoral/common femoral veins DVT and was on Eliquis.  We will continue that.  Chest discomfort: She was complaining of chest  discomfort during her presentation to the cancer center.  Currently denies any chest pain.  Chest pain most likely musculoskeletal in etiology and she had this problem in the past.  Anemia: Normocytic anemia treated with malignancy.  We will continue to monitor H&H and transfuse as necessary  Moderate protein calorie malnutrition: Nutrition consultation.  Encourage oral intake.  History of sinus tachycardia: She was on metoprolol at home for sinus tachycardia. Restarted.  Leukocytosis: Most likely reactive.  Paracentesis fluid analysis does not suggest SBP.  Patient is afebrile      DVT prophylaxis: Eliquis Code Status: Full Family Communication: None present at the bedside Disposition Plan: Home after resolution of pain   Consultants: Oncology, palliative care  Procedures: Paracentesis on 09/23/17  Antimicrobials:None  Subjective: Patient seen and examined the bedside this morning.  Remains weak.  Diarrhea has improved.  Still complains of abdominal pain and nausea.  Objective: Vitals:   09/23/17 1323 09/23/17 2021 09/23/17 2123 09/24/17 0510  BP: 104/73  111/90 103/71  Pulse: (!) 120  (!) 134   Resp: 18  16 16   Temp: 97.9 F (36.6 C)  97.6 F (36.4 C)   TempSrc: Axillary  Oral   SpO2: 100%  97% 94%  Weight:  52.6 kg (115 lb 15.4 oz)    Height:  5' 2.99" (1.6 m)      Intake/Output Summary (Last 24 hours) at 09/24/2017 1137 Last data filed at 09/23/2017 2028 Gross per 24 hour  Intake 270 ml  Output -  Net 270 ml   [employer]   09/23/17  0626 09/23/17 2021  Weight: 52.6 kg (115 lb 15.4 oz) 52.6 kg (115 lb 15.4 oz)    Examination:  General exam: In mild distress due to pain, cachectic HEENT:PERRL,Oral mucosa moist, Ear/Nose normal on gross exam Respiratory system: Bilateral equal air entry, normal vesicular breath sounds, no wheezes or crackles  Cardiovascular system: Sinus tachycardia, S1 & S2 heard, RRR. No JVD, murmurs, rubs, gallops or clicks.   Port-A-Cath on the right chest Gastrointestinal system: Abdomen is distended, soft and tender. Hepatomegaly.colostomy bag with stool  Central nervous system: Alert and oriented. No focal neurological deficits.  Generalized weakness Extremities: No edema, no clubbing ,no cyanosis, distal peripheral pulses palpable. Skin: No rashes, lesions or ulcers,no icterus ,no pallor    Data Reviewed: I have personally reviewed following labs and imaging studies  CBC: Recent Labs  Lab 09/16/2017 1844 09/24/17 0634  WBC 3.2* 14.7*  NEUTROABS 2.5 13.4*  HGB 9.0* 9.9*  HCT 28.4* 32.5*  MCV 84.3 88.3  PLT 159 948   Basic Metabolic Panel: Recent Labs  Lab 09/30/2017 1844 09/24/17 0634  NA 130* 131*  K 3.9 4.4  CL 90* 94*  CO2 23 21*  GLUCOSE 66 111*  BUN 7 8  CREATININE 0.58 0.73  CALCIUM 8.5* 8.4*   GFR: Estimated Creatinine Clearance: 87.4 mL/min (by C-G formula based on SCr of 0.73 mg/dL). Liver Function Tests: Recent Labs  Lab 09/29/2017 1844  AST 22  ALT 12*  ALKPHOS 177*  BILITOT 0.6  PROT 6.0*  ALBUMIN 1.8*   No results for input(s): LIPASE, AMYLASE in the last 168 hours. No results for input(s): AMMONIA in the last 168 hours. Coagulation Profile: No results for input(s): INR, PROTIME in the last 168 hours. Cardiac Enzymes: No results for input(s): CKTOTAL, CKMB, CKMBINDEX, TROPONINI in the last 168 hours. BNP (last 3 results) No results for input(s): PROBNP in the last 8760 hours. HbA1C: No results for input(s): HGBA1C in the last 72 hours. CBG: No results for input(s): GLUCAP in the last 168 hours. Lipid Profile: No results for input(s): CHOL, HDL, LDLCALC, TRIG, CHOLHDL, LDLDIRECT in the last 72 hours. Thyroid Function Tests: No results for input(s): TSH, T4TOTAL, FREET4, T3FREE, THYROIDAB in the last 72 hours. Anemia Panel: No results for input(s): VITAMINB12, FOLATE, FERRITIN, TIBC, IRON, RETICCTPCT in the last 72 hours. Sepsis Labs: No results for input(s):  PROCALCITON, LATICACIDVEN in the last 168 hours.  Recent Results (from the past 240 hour(s))  Body fluid culture     Status: None (Preliminary result)   Collection Time: 09/23/17 12:33 PM  Result Value Ref Range Status   Specimen Description   Final    PERITONEAL Performed at [facility] [address] [place], [place] [address]    Special Requests   Final    NONE Performed at [facility], [place] [address], [place], [place] [address]    Gram Stain NO WBC SEEN NO ORGANISMS SEEN   Final   Culture   Final    NO GROWTH < 24 HOURS Performed at [facility], [place] [address] [place], [place] [address]    Report Status PENDING  Incomplete         Radiology Studies: US Paracentesis  Result Date: 09/23/2017 INDICATION: Patient with history of metastatic rectal cancer with recurrent malignant ascites. Request made for diagnostic and therapeutic paracentesis. EXAM: ULTRASOUND GUIDED DIAGNOSTIC AND THERAPEUTIC PARACENTESIS MEDICATIONS: None COMPLICATIONS: None immediate. PROCEDURE: Informed written consent was obtained from the patient after  a discussion of the risks, benefits and alternatives to treatment. A timeout was performed prior to the initiation of the procedure. Initial ultrasound scanning demonstrates a moderate amount of ascites within the right lower abdominal quadrant. The right lower abdomen was prepped and draped in the usual sterile fashion. 1% lidocaine was used for local anesthesia. Following this, a 19 gauge, 7-cm, Yueh catheter was introduced. An ultrasound image was saved for documentation purposes. The paracentesis was performed. The catheter was removed and a dressing was applied. The patient tolerated the procedure well without immediate post procedural complication. FINDINGS: A total of approximately 3.1 liters of bloody fluid was removed. Samples were sent to the laboratory as requested by the clinical team. IMPRESSION:  Successful ultrasound-guided diagnostic and therapeutic paracentesis yielding 3.1 liters of peritoneal fluid. Read by: Rowe Robert, PA-C Electronically Signed   By: Jacqulynn Cadet M.D.   On: 09/23/2017 13:34        Scheduled Meds: . apixaban  5 mg Oral BID  . fentaNYL  150 mcg Transdermal Q72H  . fluconazole  100 mg Oral Daily  . pantoprazole  40 mg Oral Daily  . potassium chloride  10 mEq Oral Daily   Continuous Infusions: . sodium chloride 125 mL/hr at 09/24/17 0747     LOS: 2 days    Time spent: 25 mins.More than 50% of that time was spent in counseling and/or coordination of care.      Shelly Coss, MD [facility] Pager [phone]  If 7PM-7AM, please contact night-coverage [email or web address] Password [facility] 09/24/2017, 11:37 AM

## 2017-09-24 NOTE — Progress Notes (Signed)
Pt. HR =139, 140 Pt. denies complaints of chest pain, states slight dizziness, and no respiratory distress noted. MD aware, new orders received.

## 2017-09-25 LAB — BASIC METABOLIC PANEL
ANION GAP: 13 (ref 5–15)
BUN: 9 mg/dL (ref 6–20)
CALCIUM: 8.2 mg/dL — AB (ref 8.9–10.3)
CO2: 20 mmol/L — ABNORMAL LOW (ref 22–32)
CREATININE: 0.65 mg/dL (ref 0.44–1.00)
Chloride: 95 mmol/L — ABNORMAL LOW (ref 101–111)
GFR calc Af Amer: >60 mL/min (ref >60)
GFR calc non Af Amer: >60 mL/min (ref >60)
GLUCOSE: 114 mg/dL — AB (ref 65–99)
Potassium: 4.1 mmol/L (ref 3.5–5.1)
Sodium: 128 mmol/L — ABNORMAL LOW (ref 135–145)

## 2017-09-25 MED ORDER — ALBUMIN HUMAN 25 % IV SOLN
25.0000 g | Freq: Four times a day (QID) | INTRAVENOUS | Status: AC
  Administered 2017-09-25 – 2017-09-26 (×3): 25 g via INTRAVENOUS
  Filled 2017-09-25 (×4): qty 100

## 2017-09-25 MED ORDER — SODIUM CHLORIDE 0.9 % IV BOLUS (SEPSIS)
500.0000 mL | Freq: Once | INTRAVENOUS | Status: AC
  Administered 2017-09-25: 500 mL via INTRAVENOUS

## 2017-09-25 MED ORDER — PHENOL 1.4 % MT LIQD
1.0000 | OROMUCOSAL | Status: DC | PRN
  Administered 2017-09-25: 1 via OROMUCOSAL
  Filled 2017-09-25: qty 177

## 2017-09-25 NOTE — Progress Notes (Signed)
Patient's vitals were stable around 2119 pm, after receiving bolus and before albumin transfusion. MD notified of updated vitals and EKG.  MD cancelled transfer to stepdown, but did order q4h vitals.  Will continue to monitor patient, and will notify MD if patient's condition changes.Roderick Pee

## 2017-09-25 NOTE — Progress Notes (Signed)
PROGRESS NOTE    Amber Silva  KZS:[id number] DOB: [birth year] DOA: 09/14/2017 PCP: [name]., MD   Principal Problem:   Abdominal pain Active Problems:   Malignant ascites   Abdominal distention   Malnutrition of moderate degree   Rectal cancer (HCC)   Nausea & vomiting   Sinus tachycardia   Encounter for palliative care   Brief Narrative: Amber Silva is a 27 y.o. female with medical history significant of stage IV rectal cancer with metastatic disease, ascites, who was recently discharged on 09/09/17 was Readmitted on 09/06/2017 as a direct admission from her oncologist (Dr Learta Codding) office today for management of abdominal pain, nausea , vomiting and diarrhea.  Patient admitted for pain control.  Please note the patient has refused palliative and hospice services     Plan:  1)Abdominal pain with nausea vomiting: Secondary to stage IV metastatic rectal cancer with recurrent ascites.  Continue pain management.  Palliative care consult appreciated,    continue opiates including Dilaudid, continue antiemetics as needed  2)Malignant ascites: Underwent palliative paracentesis on 09/23/17 with removal of 3.1 L of bloody ascites fluid . She had multiple paracentesis in the last few weeks.   3)Stage IV rectal cancer:She has  completed cycle 3 of FOLFIRI/Panitumumab on 09/15/2017 as per the oncology note.Dr Benay Spice following, Patient has very poor prognosis.  She is still optimistic for cure of her terminal stage cancer.  Palliative care following but discussion regarding goals of care /comfort measures has not progressed because patient still wants everything to be done ,   4)Ostomy Concerns-at ostomy area looks irritated with some foul-smelling ordor, get wound care consult, she is S/P Colostomy.  5) hypotension and hemodynamic concerns- BP runs low, patient is tachycardic, patient does have some facial and upper chest area swelling,??? SVC compression, give IV albumin to help  intravascular volume and by so doing BP, and also to minimize third spacing/interstitial fluid, unable to tolerate metoprolol for tachycardia due to low BP  6)History of DVT:  Has history of left femoral/common femoral veins DVT, c/n Eliquis  7) chronic anemia: orNmocytic anemia setting of metastatic cancer and ongoing chemotherapy, monitor H&H and transfuse as clinically indicated,   8)Moderate protein calorie malnutrition: Nutrition consultation.  Encourage oral intake.  9)Leukocytosis: Most likely reactive.  Paracentesis fluid analysis and clinical exam does not suggest SBP.  Patient is afebrile  10)Social/Ethics-palliative care consult dated 09/24/2017 appreciated, patient remains a full code, patient remains full scope of treatment, no de-escalation, no limitations to patient treatment at this time  DVT prophylaxis: Eliquis Family Communication: None present at the bedside Disposition Plan: Home after resolution of pain  Consultants: Oncology, palliative care  Procedures: Paracentesis on 09/23/17  Antimicrobials:None  Subjective: Abdominal pain is better, diarrhea is improved, BP was low, BP improved after IV albumin and fluids  Objective: Vitals:   09/25/17 1041 09/25/17 1342 09/25/17 1357 09/25/17 1427  BP: (!) 84/60 (!) 87/60 107/75 100/77  Pulse: (!) 114 (!) 122 (!) 120 (!) 119  Resp:  16 16 16   Temp: 97.8 F (36.6 C) 97.7 F (36.5 C) 97.7 F (36.5 C) 97.8 F (36.6 C)  TempSrc: Axillary Axillary Axillary Axillary  SpO2: 100% 99% 99% 99%  Weight:      Height:        Intake/Output Summary (Last 24 hours) at 09/25/2017 1747 Last data filed at 09/25/2017 1600 Gross per 24 hour  Intake 4090 ml  Output 1 ml  Net 4089 ml   [employer]  09/23/17 0618 09/23/17 2021  Weight: 52.6 kg (115 lb 15.4 oz) 52.6 kg (115 lb 15.4 oz)    Examination:  General exam: She looks chronically ill, in no acute distress at this time ,  HEENT: Hearing is adequate, pupils are  reactive Lungs-diminished in bases otherwise clear bilaterally,  CV- Sinus tachycardia, S1 & S2 ,   Port-A-Cath on the right chest clean dry and intact Gastrointestinal system: Abdomen is distended, soft and tender. Hepatomegaly.colostomy bag with stool  Central nervous system: Alert and oriented. No focal neurological deficits.  Generalized weakness Extremities: No edema,   distal peripheral pulses palpable. Skin: No rashes, dry, some swelling of upper chest and neck areas   Data Reviewed: I have personally reviewed following labs and imaging studies  CBC: Recent Labs  Lab 09/09/2017 1844 09/24/17 0634 09/24/17 1239  WBC 3.2* 14.7* 10.3  NEUTROABS 2.5 13.4* 9.2*  HGB 9.0* 9.9* 8.8*  HCT 28.4* 32.5* 28.4*  MCV 84.3 88.3 87.9  PLT 159 209 341   Basic Metabolic Panel: Recent Labs  Lab 09/24/2017 1844 09/24/17 0634 09/25/17 0630  NA 130* 131* 128*  K 3.9 4.4 4.1  CL 90* 94* 95*  CO2 23 21* 20*  GLUCOSE 66 111* 114*  BUN 7 8 9   CREATININE 0.58 0.73 0.65  CALCIUM 8.5* 8.4* 8.2*   GFR: Estimated Creatinine Clearance: 87.4 mL/min (by C-G formula based on SCr of 0.65 mg/dL). Liver Function Tests: Recent Labs  Lab 09/02/2017 1844  AST 22  ALT 12*  ALKPHOS 177*  BILITOT 0.6  PROT 6.0*  ALBUMIN 1.8*   No results for input(s): LIPASE, AMYLASE in the last 168 hours. No results for input(s): AMMONIA in the last 168 hours. Coagulation Profile: No results for input(s): INR, PROTIME in the last 168 hours. Cardiac Enzymes: No results for input(s): CKTOTAL, CKMB, CKMBINDEX, TROPONINI in the last 168 hours. BNP (last 3 results) No results for input(s): PROBNP in the last 8760 hours. HbA1C: No results for input(s): HGBA1C in the last 72 hours. CBG: No results for input(s): GLUCAP in the last 168 hours. Lipid Profile: No results for input(s): CHOL, HDL, LDLCALC, TRIG, CHOLHDL, LDLDIRECT in the last 72 hours. Thyroid Function Tests: No results for input(s): TSH, T4TOTAL, FREET4,  T3FREE, THYROIDAB in the last 72 hours. Anemia Panel: No results for input(s): VITAMINB12, FOLATE, FERRITIN, TIBC, IRON, RETICCTPCT in the last 72 hours. Sepsis Labs: No results for input(s): PROCALCITON, LATICACIDVEN in the last 168 hours.  Recent Results (from the past 240 hour(s))  Body fluid culture     Status: None (Preliminary result)   Collection Time: 09/23/17 12:33 PM  Result Value Ref Range Status   Specimen Description   Final    PERITONEAL Performed at [facility] [address], [place], [place] [address]    Special Requests   Final    NONE Performed at [facility], [place] [address] [place], [place] [address]    Gram Stain NO WBC SEEN NO ORGANISMS SEEN   Final   Culture   Final    NO GROWTH 2 DAYS Performed at [facility], [place] [address] [place], [place] [address]    Report Status PENDING  Incomplete         Radiology Studies: Dg Chest Port 1 View  Result Date: 09/24/2017 CLINICAL DATA:  Chest discomfort and known history of lung carcinoma EXAM: PORTABLE CHEST 1 VIEW COMPARISON:  08/31/2017 FINDINGS: Cardiac shadow is stable. Right chest wall  port is seen. Some patchy atelectatic changes are noted in the right base. Previously seen right pleural effusion has decreased in the interval from the prior exam. There has however been interval development of a moderate left pleural effusion with underlying atelectasis/infiltrate. IMPRESSION: Increasing left effusion and basilar changes as described. Electronically Signed   By: Inez Catalina M.D.   On: 09/24/2017 20:17   Dg Abd Portable 1v  Result Date: 09/24/2017 CLINICAL DATA:  Distended abdomen, history of colon carcinoma EXAM: PORTABLE ABDOMEN - 1 VIEW COMPARISON:  09/01/2017 FINDINGS: Changes consistent with left-sided ostomy. Scattered bowel gas is noted without obstructive change. Ingested pill is noted within the distal stomach. No acute bony abnormality is noted.  IMPRESSION: Nonspecific abdomen. Electronically Signed   By: Inez Catalina M.D.   On: 09/24/2017 20:16     Scheduled Meds: . apixaban  5 mg Oral BID  . fentaNYL  150 mcg Transdermal Q72H  . fluconazole  100 mg Oral Daily  . pantoprazole  40 mg Oral Daily  . potassium chloride  10 mEq Oral Daily   Continuous Infusions: . sodium chloride 125 mL/hr at 09/25/17 0941  . albumin human       LOS: 3 days   Roxan Hockey, MD [facility] Pager [phone]  If 7PM-7AM, please contact night-coverage [email or web address] Password [facility] 09/25/2017, 5:47 PM

## 2017-09-25 NOTE — Progress Notes (Signed)
PT Cancellation Note  Patient Details Name: Amber Silva MRN: [id number] DOB: 24-Oct-1990   Cancelled Treatment:    Reason Eval/Treat Not Completed: Medical issues which prohibited therapy(NT just in to check vitals, BP 72/53 mmHg; pt also eating breakfast)   Stanton Kissoon,KATHrine E 09/25/2017, 10:26 AM Carmelia Bake, PT, DPT 09/25/2017  Pager: [phone]

## 2017-09-26 ENCOUNTER — Inpatient Hospital Stay (HOSPITAL_COMMUNITY): Payer: Medicaid Other

## 2017-09-26 ENCOUNTER — Encounter (HOSPITAL_COMMUNITY): Payer: Self-pay | Admitting: Radiology

## 2017-09-26 DIAGNOSIS — R140 Abdominal distension (gaseous): Secondary | ICD-10-CM

## 2017-09-26 DIAGNOSIS — Z7901 Long term (current) use of anticoagulants: Secondary | ICD-10-CM

## 2017-09-26 DIAGNOSIS — M7989 Other specified soft tissue disorders: Secondary | ICD-10-CM

## 2017-09-26 DIAGNOSIS — J918 Pleural effusion in other conditions classified elsewhere: Secondary | ICD-10-CM

## 2017-09-26 DIAGNOSIS — I82890 Acute embolism and thrombosis of other specified veins: Secondary | ICD-10-CM

## 2017-09-26 LAB — BASIC METABOLIC PANEL
Anion gap: 9 (ref 5–15)
BUN: 6 mg/dL (ref 6–20)
CHLORIDE: 100 mmol/L — AB (ref 101–111)
CO2: 22 mmol/L (ref 22–32)
Calcium: 8.4 mg/dL — ABNORMAL LOW (ref 8.9–10.3)
Creatinine, Ser: 0.49 mg/dL (ref 0.44–1.00)
GFR calc Af Amer: >60 mL/min (ref >60)
GLUCOSE: 91 mg/dL (ref 65–99)
POTASSIUM: 3.4 mmol/L — AB (ref 3.5–5.1)
Sodium: 131 mmol/L — ABNORMAL LOW (ref 135–145)

## 2017-09-26 LAB — CBC
HEMATOCRIT: 20.2 % — AB (ref 36.0–46.0)
Hemoglobin: 6.3 g/dL — CL (ref 12.0–15.0)
MCH: 27.9 pg (ref 26.0–34.0)
MCHC: 31.2 g/dL (ref 30.0–36.0)
MCV: 89.4 fL (ref 78.0–100.0)
PLATELETS: 234 10*3/uL (ref 150–400)
RBC: 2.26 MIL/uL — AB (ref 3.87–5.11)
RDW: 18.1 % — ABNORMAL HIGH (ref 11.5–15.5)
WBC: 5.1 10*3/uL (ref 4.0–10.5)

## 2017-09-26 LAB — PREPARE RBC (CROSSMATCH)

## 2017-09-26 LAB — HEPARIN LEVEL (UNFRACTIONATED): Heparin Unfractionated: >2.2 IU/mL — ABNORMAL HIGH (ref 0.30–0.70)

## 2017-09-26 LAB — BODY FLUID CULTURE
Culture: NO GROWTH
GRAM STAIN: NONE SEEN

## 2017-09-26 LAB — APTT: aPTT: 33 seconds (ref 24–36)

## 2017-09-26 MED ORDER — LIP MEDEX EX OINT
TOPICAL_OINTMENT | CUTANEOUS | Status: AC
  Filled 2017-09-26: qty 7

## 2017-09-26 MED ORDER — LIDOCAINE HCL 1 % IJ SOLN
INTRAMUSCULAR | Status: AC
  Filled 2017-09-26: qty 10

## 2017-09-26 MED ORDER — FUROSEMIDE 10 MG/ML IJ SOLN
20.0000 mg | Freq: Once | INTRAMUSCULAR | Status: AC
  Administered 2017-09-27: 20 mg via INTRAVENOUS
  Filled 2017-09-26: qty 2

## 2017-09-26 MED ORDER — IPRATROPIUM-ALBUTEROL 0.5-2.5 (3) MG/3ML IN SOLN
3.0000 mL | Freq: Four times a day (QID) | RESPIRATORY_TRACT | Status: DC | PRN
  Administered 2017-09-26 – 2017-09-28 (×3): 3 mL via RESPIRATORY_TRACT
  Filled 2017-09-26 (×4): qty 3

## 2017-09-26 MED ORDER — HEPARIN (PORCINE) IN NACL 100-0.45 UNIT/ML-% IJ SOLN
1200.0000 [IU]/h | INTRAMUSCULAR | Status: DC
  Administered 2017-09-26: 1100 [IU]/h via INTRAVENOUS
  Administered 2017-09-28: 1200 [IU]/h via INTRAVENOUS
  Filled 2017-09-26 (×4): qty 250

## 2017-09-26 MED ORDER — HEPARIN (PORCINE) IN NACL 100-0.45 UNIT/ML-% IJ SOLN: 1100.0000 [IU]/h | INTRAMUSCULAR | Status: DC

## 2017-09-26 MED ORDER — PROMETHAZINE HCL 25 MG PO TABS
12.5000 mg | ORAL_TABLET | Freq: Four times a day (QID) | ORAL | Status: DC | PRN
  Administered 2017-09-28: 12.5 mg via ORAL
  Filled 2017-09-26: qty 1

## 2017-09-26 MED ORDER — IPRATROPIUM-ALBUTEROL 0.5-2.5 (3) MG/3ML IN SOLN
3.0000 mL | Freq: Three times a day (TID) | RESPIRATORY_TRACT | Status: DC
  Administered 2017-09-26 – 2017-09-28 (×6): 3 mL via RESPIRATORY_TRACT
  Filled 2017-09-26 (×6): qty 3

## 2017-09-26 MED ORDER — ALBUMIN HUMAN 5 % IV SOLN
12.5000 g | Freq: Once | INTRAVENOUS | Status: DC
  Filled 2017-09-26: qty 250

## 2017-09-26 MED ORDER — SODIUM CHLORIDE 0.9 % IV SOLN
Freq: Once | INTRAVENOUS | Status: AC
  Administered 2017-09-26: 23:00:00 via INTRAVENOUS

## 2017-09-26 MED ORDER — LIP MEDEX EX OINT
TOPICAL_OINTMENT | CUTANEOUS | Status: DC | PRN
  Administered 2017-09-26: 07:00:00 via TOPICAL

## 2017-09-26 MED ORDER — IPRATROPIUM-ALBUTEROL 0.5-2.5 (3) MG/3ML IN SOLN
3.0000 mL | Freq: Three times a day (TID) | RESPIRATORY_TRACT | Status: DC
  Administered 2017-09-26: 3 mL via RESPIRATORY_TRACT
  Filled 2017-09-26: qty 3

## 2017-09-26 MED ORDER — IOPAMIDOL (ISOVUE-300) INJECTION 61%
75.0000 mL | Freq: Once | INTRAVENOUS | Status: AC | PRN
  Administered 2017-09-26: 75 mL via INTRAVENOUS

## 2017-09-26 MED ORDER — IOPAMIDOL (ISOVUE-300) INJECTION 61%
INTRAVENOUS | Status: AC
  Administered 2017-09-26: 09:00:00
  Filled 2017-09-26: qty 75

## 2017-09-26 MED ORDER — IPRATROPIUM-ALBUTEROL 0.5-2.5 (3) MG/3ML IN SOLN
3.0000 mL | Freq: Four times a day (QID) | RESPIRATORY_TRACT | Status: DC
  Administered 2017-09-26: 3 mL via RESPIRATORY_TRACT
  Filled 2017-09-26: qty 3

## 2017-09-26 MED ORDER — MAGIC MOUTHWASH
10.0000 mL | Freq: Four times a day (QID) | ORAL | Status: DC | PRN
  Administered 2017-09-26 – 2017-09-27 (×3): 10 mL via ORAL
  Filled 2017-09-26 (×4): qty 10

## 2017-09-26 NOTE — Progress Notes (Signed)
CRITICAL VALUE ALERT  Critical Value:  HGB 6.3  Date & Time Notied: 09/26/17 at 0630  Provider Notified: Bodenheimer  Orders Received/Actions taken: No response back but Dr. Benay Spice notified during rounds.

## 2017-09-26 NOTE — Progress Notes (Signed)
IP PROGRESS NOTE  Subjective:   [name] reports adequate pain relief when she receives IV Dilaudid.  She developed facial swelling over the weekend.  She complains of shortness of breath.  She underwent a pubic paracentesis 09/23/2017.  She has noted ulceration at the ostomy stoma. Her mouth and lips are sore.  Objective: Vital signs in last 24 hours: Blood pressure 118/86, pulse (!) 117, temperature (!) 97.4 F (36.3 C), temperature source Oral, resp. rate 20, height 5' 2.99" (1.6 m), weight 115 lb 15.4 oz (52.6 kg), SpO2 96 %.  Intake/Output from previous day: 03/24 0701 - 03/25 0700 In: 2519.6 [P.O.:480; I.V.:1939.6; IV Piggyback:100] Out: 2 [Urine:2]  Physical Exam: HEENT: Superficial ulcerations at the inner lower lip, no thrush.   Cardiac: Regular rate and rhythm, tachycardia Lungs: End inspiratory rales at the left upper anterior chest, decreased breath sounds at the lower posterior chest bilaterally Abdomen: Distended, diffuse tenderness, palpable liver edge versus abdominal wall tumor at the right and mid abdomen, colostomy with Molnar stool Vascular: Trace edema at the left lower leg, both arms, and face.  Edema at the right lower neck and right upper anterior chest Skin: Sacral decubitus covered with a dressing  Portacath/PICC-without erythema  Lab Results: Recent Labs    09/24/17 1239 09/26/17 0538  WBC 10.3 5.1  HGB 8.8* 6.3*  HCT 28.4* 20.2*  PLT 200 234    BMET Recent Labs    09/25/17 0630 09/26/17 0538  NA 128* 131*  K 4.1 3.4*  CL 95* 100*  CO2 20* 22  GLUCOSE 114* 91  BUN 9 6  CREATININE 0.65 0.49  CALCIUM 8.2* 8.4*    Medications: I have reviewed the patient's current medications.  Assessment/Plan: 1. Rectal cancer July 2018  Colonoscopy 01/25/2017-biopsy showedinvasive moderately differentiated adenocarcinoma with mucinous features.   CT chest/abdomen/pelvis 02/11/2017-very large ulcerated rectal mass with severe wall thickening over  the distal 11.2 cm of the rectum extending to the anus with surrounding perirectal adenopathy and pelvic sidewall adenopathy.At least 2 metastatic lesions in the right hepatic lobe.   MRI of the pelvis 02/14/2017-T4b,N2bulky rectal mass with deep infiltrative invasion of the bilateral pelvic sidewalls, presacral space, mesorectal fascia, distal branches of the internal iliac vasculature and sacral plexus. There was abnormal signal intensity along both sciatic nerves possibly representing perineural extension. The mass was noted to approximate the posterior vaginal wall without definite invasion.   PET scan 02/15/2017-colorectal malignancy with mesorectal fascia infiltration and 2 FDG avid hepatic metastases. Regional lymphadenopathy found on prior imaging was not FDG avid on the PET scan.   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  Foundation 1-KRAS/NRASwild-type; microsatellite stable; tumor mutational burden 3; BRCA1 rearrangement intron 2  4 cycles of FOLFIRINOX9/11/2016 through 04/27/2017.   CT abdomen/pelvis 05/06/2017-large rectal mass and known hepatic metastases similar to recent comparison.  Status postpartial course of radiation/Xeloda.   CTs 07/12/2017-large necrotic invasive rectal mass; increased size of known liver metastatic lesions; interval increase in size of peritoneal implants; new development of moderate volume abdominal ascites; gas again present within the anterior rectal wall closely opposing the gas-filled vagina. Rectovaginal fistula not excluded.   Admitted to [facility] 08/12/2017 with abdominal pain and swelling  CT abdomen/pelvis 08/11/2017-large rectal mass that appeared necrotic and with discrete tract extending toward the vagina/perineum; interval development of massive ascites; diffuse caking of the omentum; descending colostomy bulging due to peritoneal metastases; hepatic metastases measuring up to 4.7 cm in the upper right liver;  large thrombus in  the left femoral and common femoral veins.  Cycle 1 FOLFIRI/panitumumab 08/18/2017  CT 08/24/2017-enlargement of liver lesions and peritoneal tumor burden  Cycle 2 FOLFIRI/panitumumab 09/02/2017  Cycle 3 FOLFIRI/Panitumumab 09/15/2017 2. Left lower extremity DVT. On Eliquis. 3. Ascitessecondary to #1. Status post repeat paracentesis procedures 4. Tachycardia-likely secondary to anemia, intravascular depletion, critical illness 5. Anemia secondary to chronic disease, chemotherapy/radiation, and phlebotomy. Red cell transfusion 08/19/2017, 09/04/2017 6. Leukopenia secondary to chemotherapy 7. Pain secondary to metastatic rectal cancer.On Duragesic patch and oxycodone prior to hospital admission 8. Diarrhea-likely secondary to irinotecan 9. Facial/arm edema -clinical evidence of upper extremity venous occlusion 10. Dyspnea- multifactorial-anemia, large pleural effusions, pulmonary emboli  [name] has advanced metastatic rectal cancer.  She has completed 3 cycles of second line chemotherapy with FOLFIRI/panitumumab.  Her performance status has declined.  She developed facial edema over the weekend.  The arms are swollen today.  I suspect she has venous obstruction secondary to thrombus involving the superior vena cava/subclavian veins.  The chest CT today also reveals large bilateral pleural effusions and persistent tumor involving the liver and abdominal cavity.  [name] has a poor prognosis.  We will continue discussions regarding hospice and supportive care.    Recommendations: 1.  Change anticoagulation to IV heparin 2.  Therapeutic/diagnostic thoracentesis 3.  Dopplers of both upper extremities     LOS: 4 days   Betsy Coder, MD   09/26/2017, 6:55 AM

## 2017-09-26 NOTE — Progress Notes (Signed)
PT Cancellation Note  Patient Details Name: Amber Silva MRN: [id number] DOB: 1991/01/10   Cancelled Treatment:    Reason Eval/Treat Not Completed: Medical issues which prohibited therapy, probable PE. will check back when stable.    Claretha Cooper 09/26/2017, 10:56 AM Tresa Endo PT [phone]

## 2017-09-26 NOTE — Consult Note (Addendum)
[place] Nurse ostomy consult note Ostomy consult requested for supplies and to assess ulceration near stoma.  Dr Benay Spice was in earlier and informed patient that the ulceration is a manifestation of cancer in her abd; according to the CT scan. Pt verbalizes understanding and is independent with pouch application and emptying prior to admission.  She uses Convatec products prior to admission and uses the largest size available.  She needs a product which will not rub against the ulceration area and irritate it further.   Stoma type/location:  Stoma is red and viable, 3 inches, located in a mound which extends above skin level.  There is a strong foul odor and full thickness wound to the anterior portion of the mound next to the stoma; 1X1X.2cm, yellow dry wound bed.   Output: Mod amt tan semiformed liquid stool in the pouch.   Ostomy pouching: She is currently wearing a 2 piece 2 3/4 inch pouch but the ring is rubbing against the mounded area; and she requests a 4 inch pouch (2 piece system) be ordered.  Education provided:  She denies need for assistance with pouching activities and prefers to manage the pouch changes by herself. Supplies ordered to the room. Please re-consult if further assistance is needed.  Thank-you,  Julien Girt MSN, [place], [place], [place], [place]

## 2017-09-26 NOTE — Progress Notes (Signed)
PROGRESS NOTE    Amber Silva  DPO:[id number] DOB: Feb 01, 1991 DOA: 09/30/2017 PCP: [name]., MD   Principal Problem:   Abdominal pain Active Problems:   Malignant ascites   Abdominal distention   Malnutrition of moderate degree   Rectal cancer (HCC)   Nausea & vomiting   Sinus tachycardia   Encounter for palliative care   Brief Narrative: Amber Silva is a 27 y.o. 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  Patient admitted for pain control.  Please note the patient has refused palliative and hospice services     Plan:  1)Bil Pulm Embolism and Rt internal jugular and SVC clot--venous Dopplers of the upper extremities without acute DVT, however CT chest suggest right internal jugular and SVC clot patient was started on IV heparin on 09/26/17 per Dr. Learta Codding the oncologist  2)Hypoxic Respiratory Failure with bilateral pleural effusions-  she has increasing dyspnea, hypoxia and increasing work of breathing, CT chest from 09/26/2017 suggest bilateral pleural effusions right more than left patient is status post ultrasound-guided thoracentesis with removal of 1.1 L of fluid from the right pleural space on 09/26/2017   3)Abdominal pain with nausea vomiting/recurrent malignant ascites: Secondary to stage IV metastatic rectal cancer with recurrent ascites. Underwent palliative paracentesis on 09/23/17 with removal of 3.1 L of bloody ascites fluid . She had multiple paracentesis in the last few weeks.  Continue pain management.  Palliative care consult appreciated,    continue opiates including Dilaudid, continue antiemetics as needed  4)Stage IV Rectal cancer/S/P prior Colostomy.:She has  completed cycle 3 of FOLFIRI/Panitumumab  on 09/15/2017 as per the oncology note.Dr Benay Spice following, Patient has very poor prognosis.  She is still optimistic for cure of her terminal stage cancer.  Palliative care following but discussion regarding goals of care /comfort measures has not progressed because patient still wants everything to be done , Ostomy Concerns-at ostomy area looks irritated with some foul-smelling ordor, get wound care consult,   5)Hypotension and hemodynamic concerns- BP runs low, patient is tachycardic, received IV albumin to help intravascular volume and by so doing help BP, and also to minimize third spacing/interstitial fluid, unable to tolerate metoprolol for tachycardia due to low BP  6)History of DVT:  Has history of left femoral/common femoral veins DVT, HOLD Eliquis, patient is now on IV heparin due to bilateral PE and right internal jugular and SVC clot  7)Acute on  chronic anemia:  patient with normocytic anemia in the setting of metastatic cancer and ongoing chemotherapy, patient is tachycardic, blood pressure is soft, hemoglobin is down to 6.3, transfuse 1 unit of packed cells, Risk, benefits and alternatives to transfusion of blood products discussed. Indication for transfusion discussed. Consent obtained  8)Moderate protein calorie malnutrition: Nutrition consultation.  Encourage oral intake.  9)Leukocytosis: Most likely reactive.  Paracentesis fluid analysis and clinical exam does not suggest SBP.  Patient is afebrile  10)Social/Ethics-palliative care consult dated 09/24/2017 appreciated, patient remains a full code, patient remains full scope of treatment, no de-escalation, no limitations to patient treatment at this time, patient continued to decline palliative and hospice services at this time  DVT prophylaxis: Iv Heparin Family Communication: None present at the bedside Disposition Plan: Home after resolution of pain  Consultants: Oncology, palliative care  Procedures:  Paracentesis  on  09/23/17 Rt Sided thoracentesis on 09/26/2017  Antimicrobials:None  Subjective: Shortness of breath is worse abdominal pain is worse, no fevers, no vomiting,  Objective: Vitals:   09/26/17 1503 09/26/17 1520 09/26/17 1549 09/26/17 1651  BP: (!) 145/119 (!) 134/97 119/89   Pulse:   (!) 120 (!) 129  Resp:   (!) 22 (!) 22  Temp:   97.7 F (36.5 C)   TempSrc:   Axillary   SpO2:   100% 95%  Weight:      Height:        Intake/Output Summary (Last 24 hours) at 09/26/2017 7062 Last data filed at 09/26/2017 1704 Gross per 24 hour  Intake 2844.57 ml  Output -  Net 2844.57 ml   Filed Weights   09/23/17 0618 09/23/17 2021  Weight: 52.6 kg (115 lb 15.4 oz) 52.6 kg (115 lb 15.4 oz)    Examination:  General exam: She looks chronically ill, speaking in sentences  HEENT: Hearing is adequate, pupils are reactive Lungs-diminished in bases right more than left, otherwise clear bilaterally,  CV- Sinus tachycardia, S1 & S2 ,   Port-A-Cath on the right chest clean dry and intact Gastrointestinal system: Abdomen is distended, soft and tender. Hepatomegaly.colostomy bag with stool  Central nervous system: Alert and oriented. No focal neurological deficits.  Generalized weakness Extremities: Warm and dry,   distal peripheral pulses palpable. Skin: some swelling of upper chest and neck areas   Data Reviewed: I have personally reviewed following labs and imaging studies  CBC: Recent Labs  Lab 10/02/2017 1844 09/24/17 0634 09/24/17 1239 09/26/17 0538  WBC 3.2* 14.7* 10.3 5.1  NEUTROABS 2.5 13.4* 9.2*  --   HGB 9.0* 9.9* 8.8* 6.3*  HCT 28.4* 32.5* 28.4* 20.2*  MCV 84.3 88.3 87.9 89.4  PLT 159 209 200 376   Basic Metabolic Panel: Recent Labs  Lab 09/29/2017 1844 09/24/17 0634 09/25/17 0630 09/26/17 0538  NA 130* 131* 128* 131*  K 3.9 4.4 4.1 3.4*  CL 90* 94* 95* 100*  CO2 23 21* 20* 22  GLUCOSE 66 111* 114* 91  BUN 7 8 9 6   CREATININE 0.58 0.73 0.65 0.49  CALCIUM 8.5* 8.4* 8.2*  8.4*   GFR: Estimated Creatinine Clearance: 87.4 mL/min (by C-G formula based on SCr of 0.49 mg/dL). Liver Function Tests: Recent Labs  Lab 09/15/2017 1844  AST 22  ALT 12*  ALKPHOS 177*  BILITOT 0.6  PROT 6.0*  ALBUMIN 1.8*   No results for input(s): LIPASE, AMYLASE in the last 168 hours. No results for input(s): AMMONIA in the last 168 hours. Coagulation Profile: No results for input(s): INR, PROTIME in the last 168 hours. Cardiac Enzymes: No results for input(s): CKTOTAL, CKMB, CKMBINDEX, TROPONINI in the last 168 hours. BNP (last 3 results) No results for input(s): PROBNP in the last 8760 hours. HbA1C: No results for input(s): HGBA1C in the last 72 hours. CBG: No results for input(s): GLUCAP in the last 168 hours. Lipid Profile: No results for input(s): CHOL, HDL, LDLCALC, TRIG, CHOLHDL, LDLDIRECT in the last 72 hours. Thyroid Function Tests: No results for input(s): TSH, T4TOTAL, FREET4, T3FREE, THYROIDAB in the last 72 hours. Anemia Panel: No results for input(s): VITAMINB12, FOLATE, FERRITIN, TIBC, IRON, RETICCTPCT in the last 72 hours. Sepsis Labs: No results for input(s): PROCALCITON, LATICACIDVEN in the last 168 hours.  Recent Results (from the past 240 hour(s))  Body fluid culture     Status: None   Collection Time: 09/23/17 12:33 PM  Result  Value Ref Range Status   Specimen Description   Final    PERITONEAL Performed at [facility] [address], [place], [place] [address]    Special Requests   Final    NONE Performed at [facility], [place] [address] [place], [place] [address]    Gram Stain NO WBC SEEN NO ORGANISMS SEEN   Final   Culture   Final    NO GROWTH 3 DAYS Performed at [facility], [place] [address], [place], [place] [address]    Report Status 09/26/2017 FINAL  Final         Radiology Studies: Dg Chest 1 View  Result Date: 09/26/2017 CLINICAL DATA:  Status post right thoracentesis. EXAM:  CHEST  1 VIEW COMPARISON:  Radiograph of September 24, 2017. FINDINGS: The heart size and mediastinal contours are within normal limits. Right lung is clear. Right internal jugular Port-A-Cath is unchanged in position. Stable moderate left pleural effusion is noted which most likely is loculated. Probable underlying atelectasis or infiltrate is noted. The visualized skeletal structures are unremarkable. IMPRESSION: Stable moderate probable loculated left pleural effusion with underlying atelectasis or infiltrate. Electronically Signed   By: Marijo Conception, M.D.   On: 09/26/2017 15:45   Ct Chest W Contrast  Result Date: 09/26/2017 CLINICAL DATA:  Colon cancer diagnosed in 2019. Chemotherapy ongoing. Chest pain with shortness of breath. EXAM: CT CHEST WITH CONTRAST TECHNIQUE: Multidetector CT imaging of the chest was performed during intravenous contrast administration. CONTRAST:  7mL ISOVUE-300 IOPAMIDOL (ISOVUE-300) INJECTION 61% COMPARISON:  Portable chest 09/24/2017. Chest CT 07/06/2017. Outside abdominal CT 07/28/2017 and additional abdominal CT 09/01/2017. FINDINGS: Cardiovascular: Study was not performed as a dedicated CTA, and there is suboptimal opacification of the pulmonary arteries. However, there are several ill-defined filling defects within the peripheral pulmonary arteries bilaterally, at least moderately suspicious for pulmonary embolism, possibly subacute in age. The central pulmonary arteries are patent. No systemic arterial abnormalities are identified. There is a partial filling defect along the posterior wall of the left brachiocephalic vein (best seen on the coronal and sagittal images), suspicious for adherent thrombus. There is possible thrombus within the right external jugular vein and the SVC, the latter surrounding the Port-A-Cath tip. No definite venous occlusion, although limited opacification of the subclavian veins. Right IJ Port-A-Cath extends to the superior cavoatrial junction.  The heart size is normal. There is no pericardial effusion. Mediastinum/Nodes: Evaluation limited by extensive soft tissue edema. No enlarged mediastinal, hilar or axillary lymph nodes are seen. The thyroid gland, trachea and esophagus demonstrate no significant findings. Lungs/Pleura: There are new large dependent pleural effusions bilaterally (new from prior chest CT and enlarged from more recent abdominal CT). There is resulting dependent atelectasis in both lungs. In addition, there is asymmetric patchy airspace disease which is greatest within the right upper and lower lobes. No focal pulmonary nodule or endobronchial lesion seen. Upper abdomen: Multifocal hepatic metastatic disease is similar in size to the most recent study. Largest lesion posteriorly in the right lobe measures 5.0 x 4.5 cm on image 92/2 (previously 5.1 x 4.1 cm). There are new thin calcifications along the right hemidiaphragm. Extensive peritoneal tumor in the left upper quadrant is lower in density and less well-defined, likely partially treated. There is a moderate amount of ascites. There is slightly increased scalloping of the liver and spleen, attributed to peritoneal disease. There is increased soft tissue edema throughout subcutaneous fat. Musculoskeletal/Chest wall: Anasarca with increased generalized soft tissue  edema. No osseous metastases identified. IMPRESSION: 1. Anasarca with increasing generalized soft tissue edema, enlarging bilateral pleural effusions and ascites. 2. At least moderate suspicion of bilateral subsegmental pulmonary emboli, possibly subacute in age. Nonocclusive/adherent thrombus along the posterior wall of the left brachiocephalic vein with possible additional nonocclusive thrombi in the right internal jugular vein and SVC. Limited opacification of the subclavian veins. No signs of right heart strain. Given the patient's upper extremity and neck swelling, consider upper extremity venous doppler evaluation. 3.  Similar appearance of treated multifocal hepatic metastatic disease. Peritoneal tumor appears less obvious, likely partly treated. 4. Critical Value/emergent results were called by telephone at the time of interpretation on 09/26/2017 at 9:30 am to Dr. Betsy Coder , who verbally acknowledged these results. Electronically Signed   By: Richardean Sale M.D.   On: 09/26/2017 09:41   Dg Chest Port 1 View  Result Date: 09/24/2017 CLINICAL DATA:  Chest discomfort and known history of lung carcinoma EXAM: PORTABLE CHEST 1 VIEW COMPARISON:  08/31/2017 FINDINGS: Cardiac shadow is stable. Right chest wall port is seen. Some patchy atelectatic changes are noted in the right base. Previously seen right pleural effusion has decreased in the interval from the prior exam. There has however been interval development of a moderate left pleural effusion with underlying atelectasis/infiltrate. IMPRESSION: Increasing left effusion and basilar changes as described. Electronically Signed   By: Inez Catalina M.D.   On: 09/24/2017 20:17   Dg Abd Portable 1v  Result Date: 09/24/2017 CLINICAL DATA:  Distended abdomen, history of colon carcinoma EXAM: PORTABLE ABDOMEN - 1 VIEW COMPARISON:  09/01/2017 FINDINGS: Changes consistent with left-sided ostomy. Scattered bowel gas is noted without obstructive change. Ingested pill is noted within the distal stomach. No acute bony abnormality is noted. IMPRESSION: Nonspecific abdomen. Electronically Signed   By: Inez Catalina M.D.   On: 09/24/2017 20:16   US Thoracentesis Asp Pleural Space W/img Guide  Result Date: 09/26/2017 INDICATION: Metastatic rectal cancer, dyspnea, bilateral pleural effusions; request made for diagnostic and therapeutic right thoracentesis. EXAM: ULTRASOUND GUIDED DIAGNOSTIC AND THERAPEUTIC RIGHT THORACENTESIS MEDICATIONS: None COMPLICATIONS: None immediate. PROCEDURE: An ultrasound guided thoracentesis was thoroughly discussed with the patient and questions answered.  The benefits, risks, alternatives and complications were also discussed. The patient understands and wishes to proceed with the procedure. Written consent was obtained. Ultrasound was performed to localize and mark an adequate pocket of fluid in the right chest. The area was then prepped and draped in the normal sterile fashion. 1% Lidocaine was used for local anesthesia. Under ultrasound guidance a 6 Fr Safe-T-Centesis catheter was introduced. Thoracentesis was performed. The catheter was removed and a dressing applied. FINDINGS: A total of approximately 1.1 liters of bloody fluid was removed. Samples were sent to the laboratory as requested by the clinical team. IMPRESSION: Successful ultrasound guided diagnostic and therapeutic right thoracentesis yielding 1.1 liters of pleural fluid. Read by: Rowe Robert, PA-C Electronically Signed   By: Marybelle Killings M.D.   On: 09/26/2017 15:24     Scheduled Meds: . fentaNYL  150 mcg Transdermal Q72H  . fluconazole  100 mg Oral Daily  . furosemide  20 mg Intravenous Once  . ipratropium-albuterol  3 mL Nebulization TID  . lidocaine      . pantoprazole  40 mg Oral Daily  . potassium chloride  10 mEq Oral Daily   Continuous Infusions: . sodium chloride 25 mL/hr at 09/26/17 1407  . sodium chloride    . albumin human    .  heparin 1,100 Units/hr (09/26/17 1630)     LOS: 4 days   Roxan Hockey, MD [facility] Pager [phone]  If 7PM-7AM, please contact night-coverage [email or web address] Password [facility] 09/26/2017, 6:22 PM

## 2017-09-26 NOTE — Progress Notes (Signed)
Bilateral upper extremity venous duplex has been completed. Negative for obvious evidence of DVT in the right and left upper extremities.  09/26/17 2:40 PM Amber Silva RVT

## 2017-09-26 NOTE — Progress Notes (Addendum)
ANTICOAGULATION CONSULT NOTE - Initial Consult  Pharmacy Consult for IV heparin - transition from apixaban Indication: DVT  No Known Allergies  Patient Measurements: Height: 5' 2.99" (160 cm) Weight: 115 lb 15.4 oz (52.6 kg) IBW/kg (Calculated) : 52.38 Heparin Dosing Weight: 52.6 kg  Vital Signs: Temp: 97.5 F (36.4 C) (03/25 1022) Temp Source: Oral (03/25 1022) BP: 132/96 (03/25 1022) Pulse Rate: 122 (03/25 1022)  Labs: Recent Labs    09/24/17 0634 09/24/17 1239 09/25/17 0630 09/26/17 0538  HGB 9.9* 8.8*  --  6.3*  HCT 32.5* 28.4*  --  20.2*  PLT 209 200  --  234  CREATININE 0.73  --  0.65 0.49    Estimated Creatinine Clearance: 87.4 mL/min (by C-G formula based on SCr of 0.49 mg/dL).   Medical History: Past Medical History:  Diagnosis Date  . Ascites   . Bronchitis   . Colon cancer ([place])   . Pregnancy induced hypertension   . S/P colostomy (HCC)     Medications:  Scheduled:  . fentaNYL  150 mcg Transdermal Q72H  . fluconazole  100 mg Oral Daily  . ipratropium-albuterol  3 mL Nebulization TID  . pantoprazole  40 mg Oral Daily  . potassium chloride  10 mEq Oral Daily   Infusions:  . sodium chloride 125 mL/hr at 09/26/17 0650  . albumin human      Assessment: 27 yo female with advanced metastatic rectal cancer with poor prognosis per Dr. Gearldine Shown note. Md suspects has venous obstruction secondary to thrombus involving the superior vena cava/subclavian veins. CT of chest revealed large bilateral pleural effusions and persistent tumor involving liver and abdominal cavity. To change apixaban 5mg  BID patient was taking PTA to IV heparin per pharmacy dosing. Hgb dropped today to 6.3 from 8.8. Plts stable WNL. Note patient did not receive dose of apixaban 5mg  today at 1000 as schedule; as a result, IV heparin can start now without a bolus. Will order baseline aPTT and heparin level to be drawn prior to start of IV heparin - note that heparin level likely to be  elevated due to apixaban given last PM. After IV heparin started, plan to continue to check aPTT and heparin level until they correlate and then heparin levels will only need to be checked.  Goal of Therapy:  Heparin level 0.3-0.7 units/ml aPTT 66-102 seconds Monitor platelets by anticoagulation protocol: Yes   Plan:  1) Discontinue apixaban 2) Start IV heparin now at rate of 1100 units/hr. NO bolus 3) Check baseline aPTT and heparin level - check prior to start of IV heparin 4) 6 hours after start of IV heparin, recheck aPTT and heparin level 5) Daily CBC   Adrian Saran, PharmD, BCPS Pager [phone] 09/26/2017 1:40 PM

## 2017-09-26 NOTE — Procedures (Signed)
Ultrasound-guided diagnostic and therapeutic right thoracentesis performed yielding 1.1 liters of bloody fluid. No immediate complications. Follow-up chest x-ray pending.The fluid was sent to the lab for cytology.

## 2017-09-26 NOTE — Progress Notes (Signed)
Daily Progress Note   Patient Name: Amber Silva       Date: 09/26/2017 DOB: 01/25/1991  Age: 27 y.o. MRN#: [id number] Attending Physician: Roxan Hockey, MD Primary Care Physician: [name]., MD Admit Date: 09/12/2017  Reason for Consultation/Follow-up: Establishing goals of care, Non pain symptom management and Pain control  Subjective:  Amber Silva is resting in bed, she is watching a movie on her cell phone. She appears more edematous, she complains of being in pain. She avoids eye contact.   I do gently bring up her CT scan results and her Hgb. Amber Silva states, "they are going to give me blood, I want my pain medication."  She states that there is nothing else she would want to discuss with me at this point.   Patient has been requiring IV Dilaudid consistently for pain relied, med history reviewed.   Length of Stay: 4  Current Medications: Scheduled Meds:  . fentaNYL  150 mcg Transdermal Q72H  . fluconazole  100 mg Oral Daily  . lidocaine      . pantoprazole  40 mg Oral Daily  . potassium chloride  10 mEq Oral Daily    Continuous Infusions: . sodium chloride 25 mL/hr at 09/26/17 1407  . albumin human    . heparin      PRN Meds: HYDROmorphone (DILAUDID) injection, HYDROmorphone, ipratropium-albuterol, lip balm, loperamide, magic mouthwash, phenol, promethazine, sodium chloride flush  Physical Exam         Awake but not as alert Resting in bed Decreased breath sounds Has edema Ostomy abd diffusely tender S 1 S 2   Vital Signs: BP (!) 132/96 (BP Location: Right Arm)   Pulse (!) 122   Temp (!) 97.5 F (36.4 C) (Oral)   Resp 20   Ht 5' 2.99" (1.6 m)   Wt 52.6 kg (115 lb 15.4 oz)   SpO2 100%   BMI 20.55 kg/m  SpO2: SpO2: 100 % O2 Device: O2 Device:  Nasal Cannula O2 Flow Rate: O2 Flow Rate (L/min): 3 L/min PPS 30% Intake/output summary:   Intake/Output Summary (Last 24 hours) at 09/26/2017 1443 Last data filed at 09/26/2017 0650 Gross per 24 hour  Intake 3320.84 ml  Output -  Net 3320.84 ml   LBM: Last BM Date: 09/25/17 Baseline Weight: Weight: 52.6 kg (115  lb 15.4 oz) Most recent weight: Weight: 52.6 kg (115 lb 15.4 oz)       Palliative Assessment/Data:      Patient Active Problem List   Diagnosis Date Noted  . Encounter for palliative care   . Nausea & vomiting 09/24/2017  . Sinus tachycardia 09/09/2017  . Port-A-Cath in place 09/15/2017  . Protein-calorie malnutrition, severe 09/08/2017  . Palliative care by specialist   . Goals of care, counseling/discussion   . Pressure injury of skin 09/02/2017  . Constipation   . Ascites 08/27/2017  . Abdominal pain 08/26/2017  . Rectal cancer ([place]) 08/18/2017  . Malnutrition of moderate degree 08/15/2017  . Malignant ascites 08/12/2017  . Abdominal distention 08/12/2017  . Hyponatremia 08/12/2017    Palliative Care Assessment & Plan   Patient Profile:    Assessment:  advanced metastatic rectal cancer abd pain PE, bilateral pleural effusions and persistent tumors noted on chest CT  Recommendations/Plan:   continue current pain and non pain symptom management  Ongoing code status and goals of care discussions with patient, PMT to continue to follow.    Code Status:    Code Status Orders  (From admission, onward)        Start     Ordered   09/27/2017 1802  Full code  Continuous     09/06/2017 1802    Code Status History    Date Active Date Inactive Code Status Order ID Comments User Context   08/26/2017 1635 09/09/2017 2140 Full Code [id number]  Phillips Grout, MD ED   08/12/2017 1753 08/23/2017 2120 Full Code [id number]  Caren Griffins, MD Inpatient       Prognosis:   guarded   Discharge Planning:  To Be Determined  Care plan was discussed with   Patient.   Thank you for allowing the Palliative Medicine Team to assist in the care of this patient.   Time In: 1300 Time Out: 1325 Total Time 25 Prolonged Time Billed  no       Greater than 50%  of this time was spent counseling and coordinating care related to the above assessment and plan.  Loistine Chance, MD [phone]  Please contact Palliative Medicine Team phone at [phone] for questions and concerns.

## 2017-09-27 ENCOUNTER — Inpatient Hospital Stay (HOSPITAL_COMMUNITY): Payer: Medicaid Other

## 2017-09-27 ENCOUNTER — Other Ambulatory Visit: Payer: Self-pay

## 2017-09-27 DIAGNOSIS — R1013 Epigastric pain: Secondary | ICD-10-CM

## 2017-09-27 DIAGNOSIS — R1010 Upper abdominal pain, unspecified: Secondary | ICD-10-CM

## 2017-09-27 DIAGNOSIS — I2699 Other pulmonary embolism without acute cor pulmonale: Secondary | ICD-10-CM

## 2017-09-27 LAB — COMPREHENSIVE METABOLIC PANEL
ALK PHOS: 148 U/L — AB (ref 38–126)
ALT: 11 U/L — AB (ref 14–54)
AST: 23 U/L (ref 15–41)
Albumin: 2.8 g/dL — ABNORMAL LOW (ref 3.5–5.0)
Anion gap: 10 (ref 5–15)
BILIRUBIN TOTAL: 0.5 mg/dL (ref 0.3–1.2)
BUN: 5 mg/dL — AB (ref 6–20)
CALCIUM: 8 mg/dL — AB (ref 8.9–10.3)
CHLORIDE: 100 mmol/L — AB (ref 101–111)
CO2: 23 mmol/L (ref 22–32)
CREATININE: 0.47 mg/dL (ref 0.44–1.00)
Glucose, Bld: 105 mg/dL — ABNORMAL HIGH (ref 65–99)
Potassium: 3.1 mmol/L — ABNORMAL LOW (ref 3.5–5.1)
Sodium: 133 mmol/L — ABNORMAL LOW (ref 135–145)
TOTAL PROTEIN: 5.6 g/dL — AB (ref 6.5–8.1)

## 2017-09-27 LAB — APTT
APTT: 64 s — AB (ref 24–36)
aPTT: 74 seconds — ABNORMAL HIGH (ref 24–36)

## 2017-09-27 LAB — BPAM RBC
BLOOD PRODUCT EXPIRATION DATE: 201904102359
ISSUE DATE / TIME: 201903252328
Unit Type and Rh: 7300

## 2017-09-27 LAB — CBC
HCT: 30.3 % — ABNORMAL LOW (ref 36.0–46.0)
Hemoglobin: 9.6 g/dL — ABNORMAL LOW (ref 12.0–15.0)
MCH: 26.1 pg (ref 26.0–34.0)
MCHC: 31.7 g/dL (ref 30.0–36.0)
MCV: 82.3 fL (ref 78.0–100.0)
Platelets: 243 10*3/uL (ref 150–400)
RBC: 3.68 MIL/uL — ABNORMAL LOW (ref 3.87–5.11)
RDW: 22.9 % — ABNORMAL HIGH (ref 11.5–15.5)
WBC: 7.1 10*3/uL (ref 4.0–10.5)

## 2017-09-27 LAB — TYPE AND SCREEN
ABO/RH(D): B POS
Antibody Screen: NEGATIVE
Unit division: 0

## 2017-09-27 LAB — HEPARIN LEVEL (UNFRACTIONATED)
HEPARIN UNFRACTIONATED: 1.82 [IU]/mL — AB (ref 0.30–0.70)
Heparin Unfractionated: 0.88 IU/mL — ABNORMAL HIGH (ref 0.30–0.70)

## 2017-09-27 MED ORDER — POTASSIUM CHLORIDE CRYS ER 10 MEQ PO TBCR
10.0000 meq | EXTENDED_RELEASE_TABLET | Freq: Every day | ORAL | Status: DC
  Administered 2017-09-27: 10 meq via ORAL

## 2017-09-27 MED ORDER — POTASSIUM CHLORIDE CRYS ER 20 MEQ PO TBCR
20.0000 meq | EXTENDED_RELEASE_TABLET | Freq: Three times a day (TID) | ORAL | Status: DC
  Administered 2017-09-27 (×2): 20 meq via ORAL
  Filled 2017-09-27 (×3): qty 1

## 2017-09-27 MED ORDER — LIDOCAINE HCL 1 % IJ SOLN
INTRAMUSCULAR | Status: AC
  Filled 2017-09-27: qty 20

## 2017-09-27 MED ORDER — SODIUM CHLORIDE 0.9 % IV BOLUS
500.0000 mL | Freq: Once | INTRAVENOUS | Status: AC
  Administered 2017-09-27: 500 mL via INTRAVENOUS

## 2017-09-27 NOTE — Progress Notes (Signed)
ANTICOAGULATION CONSULT NOTE - Follow Up Consult  Pharmacy Consult for Heparin Indication: pulmonary embolus and DVT  No Known Allergies  Patient Measurements: Height: 5' 2.99" (160 cm) Weight: 115 lb 15.4 oz (52.6 kg) IBW/kg (Calculated) : 52.38 Heparin Dosing Weight:   Vital Signs: Temp: 97.6 F (36.4 C) (03/26 0226) Temp Source: Axillary (03/26 0226) BP: 119/70 (03/26 0226) Pulse Rate: 140 (03/26 0226)  Labs: Recent Labs    09/24/17 0634 09/24/17 1239 09/25/17 0630 09/26/17 0538 09/26/17 1620 09/27/17 0152  HGB 9.9* 8.8*  --  6.3*  --   --   HCT 32.5* 28.4*  --  20.2*  --   --   PLT 209 200  --  234  --   --   APTT  --   --   --   --  33 64*  HEPARINUNFRC  --   --   --   --  >2.20* 1.82*  CREATININE 0.73  --  0.65 0.49  --   --     Estimated Creatinine Clearance: 87.4 mL/min (by C-G formula based on SCr of 0.49 mg/dL).   Medications:  Infusions:  . sodium chloride 25 mL/hr at 09/26/17 1407  . sodium chloride    . albumin human    . heparin 1,200 Units/hr (09/27/17 0318)    Assessment: Patient with PTT just below goal.  Heparin level still not resulted but prior baseline > 2.2, therefore expect to be high again and will only use PTT at this time.  PTT ordered with Heparin level until both correlate due to possible drug-lab interaction between oral anticoagulant (rivaroxaban, edoxaban, or apixaban) and anti-Xa level (aka heparin level)   Goal of Therapy:  Heparin level 0.3-0.7 units/ml aPTT 66-102 seconds Monitor platelets by anticoagulation protocol: Yes   Plan:  Increase heparin to 1200 units/hr Recheck PTT/heparin level at Amber Silva, Amber Silva 09/27/2017,3:18 AM

## 2017-09-27 NOTE — Progress Notes (Signed)
Daily Progress Note   Patient Name: Amber Silva       Date: 09/27/2017 DOB: Aug 08, 1990  Age: 27 y.o. MRN#: [id number] Attending Physician: Reyne Dumas, MD Primary Care Physician: [name]., MD Admit Date: 09/17/2017  Reason for Consultation/Follow-up: Establishing goals of care, Non pain symptom management and Pain control  Subjective:  Amber Silva is resting in bed.  Reports she is tired and does not want to talk.  She avoids eye contact.   Attempted to again gently engage and she stated she was not in the mood to talk.  Chart reviewed and she has been requiring IV Dilaudid consistently for pain relied, med history reviewed.   Length of Stay: 5  Current Medications: Scheduled Meds:  . fentaNYL  150 mcg Transdermal Q72H  . fluconazole  100 mg Oral Daily  . ipratropium-albuterol  3 mL Nebulization TID  . pantoprazole  40 mg Oral Daily  . potassium chloride  20 mEq Oral TID    Continuous Infusions: . sodium chloride 25 mL/hr at 09/26/17 1407  . albumin human    . heparin 1,200 Units/hr (09/27/17 0318)    PRN Meds: HYDROmorphone (DILAUDID) injection, HYDROmorphone, ipratropium-albuterol, lip balm, loperamide, magic mouthwash, phenol, promethazine, promethazine, sodium chloride flush  Physical Exam         Awake but sleepy. Resting in bed Decreased breath sounds + edema Ostomy abd diffusely tender S 1 S 2   Vital Signs: BP 106/82 (BP Location: Right Arm)   Pulse (!) 111   Temp 97.6 F (36.4 C) (Axillary)   Resp (!) 28   Ht 5' 2.99" (1.6 m)   Wt 52.6 kg (115 lb 15.4 oz)   SpO2 97%   BMI 20.55 kg/m  SpO2: SpO2: 97 % O2 Device: O2 Device: Simple Mask O2 Flow Rate: O2 Flow Rate (L/min): 5 L/min PPS 30% Intake/output summary:   Intake/Output Summary (Last  24 hours) at 09/27/2017 1032 Last data filed at 09/27/2017 0546 Gross per 24 hour  Intake 2152.56 ml  Output 400 ml  Net 1752.56 ml   LBM: Last BM Date: 09/26/17 Baseline Weight: Weight: 52.6 kg (115 lb 15.4 oz) Most recent weight: Weight: 52.6 kg (115 lb 15.4 oz)       Palliative Assessment/Data:      Patient Active Problem List  Diagnosis Date Noted  . Encounter for palliative care   . Nausea & vomiting 09/08/2017  . Sinus tachycardia 09/23/2017  . Port-A-Cath in place 09/15/2017  . Protein-calorie malnutrition, severe 09/08/2017  . Palliative care by specialist   . Goals of care, counseling/discussion   . Pressure injury of skin 09/02/2017  . Constipation   . Ascites 08/27/2017  . Abdominal pain 08/26/2017  . Rectal cancer ([place]) 08/18/2017  . Malnutrition of moderate degree 08/15/2017  . Malignant ascites 08/12/2017  . Abdominal distention 08/12/2017  . Hyponatremia 08/12/2017    Palliative Care Assessment & Plan   Patient Profile:    Assessment:  advanced metastatic rectal cancer abd pain PE, bilateral pleural effusions and persistent tumors noted on chest CT  Recommendations/Plan:  Continue current pain and non pain symptom management  Ongoing code status and goals of care discussions with patient as she will engage.  Today, she refused to discuss at all.   PMT to continue to follow.    Code Status:    Code Status Orders  (From admission, onward)        Start     Ordered   09/02/2017 1802  Full code  Continuous     09/21/2017 1802    Code Status History    Date Active Date Inactive Code Status Order ID Comments User Context   08/26/2017 1635 09/09/2017 2140 Full Code [id number]  Phillips Grout, MD ED   08/12/2017 1753 08/23/2017 2120 Full Code [id number]  Caren Griffins, MD Inpatient       Prognosis:   guarded   Discharge Planning:  To Be Determined  Care plan was discussed with  Patient.   Thank you for allowing the Palliative  Medicine Team to assist in the care of this patient.   Time In: 0920 Time Out: 0940 Total Time 20 Prolonged Time Billed  no       Greater than 50%  of this time was spent counseling and coordinating care related to the above assessment and plan.  Micheline Rough, MD [facility] [phone]  Please contact Palliative Medicine Team phone at [phone] for questions and concerns.

## 2017-09-27 NOTE — Progress Notes (Signed)
PROGRESS NOTE    Amber Silva  WPY:[id number] DOB: 27-Apr-1991 DOA: 09/23/2017 PCP: [name]., MD   Principal Problem:   Abdominal pain Active Problems:   Malignant ascites   Abdominal distention   Malnutrition of moderate degree   Rectal cancer ([facility])   Nausea & vomiting   Sinus tachycardia   Encounter for palliative care   Brief Narrative: Amber Silva is a 27 y.o. 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  Patient admitted for pain control.  Please note the patient has refused palliative and hospice services    Assessment and plan  1)Bil Pulm Embolism and Rt internal jugular and SVC clot--venous Dopplers of the upper extremities without acute DVT, however CT chest suggest right internal jugular and SVC clot patient was started on IV heparin on 09/26/17 per Dr. Learta Codding the oncologist. Continue heparin drip pending procedures  2)Hypoxic Respiratory Failure with bilateral pleural effusions-  she has increasing dyspnea, hypoxia and increasing work of breathing, CT chest from 09/26/2017 suggest bilateral pleural effusions right more than left patient is status post ultrasound-guided thoracentesis with removal of 1.1 L of fluid from the right pleural space on 09/26/2017 . She needs more fluid removed from the left pleural space and had thoracentesis today with removal of 1.3 L of bloody fluid  3)Abdominal pain with nausea vomiting/recurrent malignant ascites: Secondary to stage IV metastatic rectal cancer with recurrent ascites. Underwent palliative paracentesis on 09/23/17 with removal of 3.1 L of bloody ascites fluid . She had multiple paracentesis in the last few weeks.  Continue pain management.  Palliative care consult  appreciated,    continue opiates including Dilaudid, continue antiemetics as needed  4)Stage IV Rectal cancer/S/P prior Colostomy.:She has  completed cycle 3 of FOLFIRI/Panitumumab on 09/15/2017 as per the oncology note.Dr Benay Spice following, Patient has very poor prognosis.  She is still optimistic for cure of her terminal stage cancer.  Palliative care/oncology continuing discussion regarding goals of care /comfort . Patient still wants everything to be done , Ostomy Concerns-at ostomy area looks irritated with some foul-smelling ordor, get wound care consult,   5)Hypotension and hemodynamic concerns- BP runs low, patient is tachycardic, received IV albumin to help intravascular volume and by so doing help BP, and also to minimize third spacing/interstitial fluid, unable to tolerate metoprolol for tachycardia due to low BP  6)History of DVT:  Has history of left femoral/common femoral veins DVT, holding Eliquis, patient is now on IV heparin due to bilateral PE and right internal jugular and SVC clot  7)Acute on  chronic anemia:  patient with normocytic anemia in the setting of metastatic cancer and ongoing chemotherapy, patient is tachycardic, blood pressure is soft, hemoglobin mproved from 6.3,>9.6 after transfusion of  1 unit of packed cells, follow CBC  8)Moderate protein calorie malnutrition: Nutrition consultation.  Encourage oral intake.  9)Leukocytosis: Most likely reactive.  Paracentesis fluid analysis and clinical exam does not suggest SBP.  Patient is afebrile  10)Social/Ethics-palliative care consult dated 09/24/2017 appreciated, patient remains a full code, patient remains full scope of treatment, no de-escalation, no limitations to patient treatment at this time, patient continued to decline palliative and hospice services at this time  11) hypokalemia likely secondary to poor oral intake, will replete and recheck  DVT prophylaxis: Iv  Heparin Family Communication: None present at  the bedside Disposition Plan: Home after cleared by oncology, ongoing palliative care discussion, overall poor prognosis  Consultants: Oncology, palliative care  Procedures:  Paracentesis on 09/23/17 Rt Sided thoracentesis on 09/26/2017  Antimicrobials:None  Subjective: Shortness of breath is worse abdominal pain is worse, no fevers, no vomiting,  Objective: Vitals:   09/26/17 2349 09/27/17 0226 09/27/17 0458 09/27/17 0803  BP: (!) 123/94 119/70 106/82   Pulse: (!) 138 (!) 140 (!) 111   Resp: (!) 24 (!) 28 (!) 28   Temp: 97.7 F (36.5 C) 97.6 F (36.4 C) 97.6 F (36.4 C)   TempSrc: Axillary Axillary Axillary   SpO2: 100% 95% 99% 97%  Weight:      Height:        Intake/Output Summary (Last 24 hours) at 09/27/2017 1045 Last data filed at 09/27/2017 0546 Gross per 24 hour  Intake 2152.56 ml  Output 400 ml  Net 1752.56 ml   Filed Weights   09/23/17 0618 09/23/17 2021  Weight: 52.6 kg (115 lb 15.4 oz) 52.6 kg (115 lb 15.4 oz)    Examination:  General exam: She looks chronically ill, speaking in sentences  HEENT: Hearing is adequate, pupils are reactive Lungs-diminished in bases right more than left, otherwise clear bilaterally,  CV- Sinus tachycardia, S1 & S2 ,   Port-A-Cath on the right chest clean dry and intact Gastrointestinal system: Abdomen is distended, soft and tender. Hepatomegaly.colostomy bag with stool  Central nervous system: Alert and oriented. No focal neurological deficits.  Generalized weakness Extremities: Warm and dry,   distal peripheral pulses palpable. Skin: some swelling of upper chest and neck areas   Data Reviewed: I have personally reviewed following labs and imaging studies  CBC: Recent Labs  Lab 09/23/2017 1844 09/24/17 0634 09/24/17 1239 09/26/17 0538 09/27/17 0546  WBC 3.2* 14.7* 10.3 5.1 7.1  NEUTROABS 2.5 13.4* 9.2*  --   --   HGB 9.0* 9.9* 8.8* 6.3* 9.6*  HCT 28.4* 32.5* 28.4* 20.2* 30.3*  MCV 84.3 88.3 87.9 89.4 82.3  PLT 159  209 200 234 517   Basic Metabolic Panel: Recent Labs  Lab 09/02/2017 1844 09/24/17 0634 09/25/17 0630 09/26/17 0538 09/27/17 0546  NA 130* 131* 128* 131* 133*  K 3.9 4.4 4.1 3.4* 3.1*  CL 90* 94* 95* 100* 100*  CO2 23 21* 20* 22 23  GLUCOSE 66 111* 114* 91 105*  BUN 7 8 9 6  5*  CREATININE 0.58 0.73 0.65 0.49 0.47  CALCIUM 8.5* 8.4* 8.2* 8.4* 8.0*   GFR: Estimated Creatinine Clearance: 87.4 mL/min (by C-G formula based on SCr of 0.47 mg/dL). Liver Function Tests: Recent Labs  Lab 09/27/2017 1844 09/27/17 0546  AST 22 23  ALT 12* 11*  ALKPHOS 177* 148*  BILITOT 0.6 0.5  PROT 6.0* 5.6*  ALBUMIN 1.8* 2.8*   No results for input(s): LIPASE, AMYLASE in the last 168 hours. No results for input(s): AMMONIA in the last 168 hours. Coagulation Profile: No results for input(s): INR, PROTIME in the last 168 hours. Cardiac Enzymes: No results for input(s): CKTOTAL, CKMB, CKMBINDEX, TROPONINI in the last 168 hours. BNP (last 3 results) No results for input(s): PROBNP in the last 8760 hours. HbA1C: No results for input(s): HGBA1C in the last 72 hours. CBG: No results for input(s): GLUCAP in the last 168 hours. Lipid Profile: No results for input(s): CHOL, HDL, LDLCALC, TRIG, CHOLHDL, LDLDIRECT in the last 72 hours. Thyroid Function Tests: No results for input(s):  TSH, T4TOTAL, FREET4, T3FREE, THYROIDAB in the last 72 hours. Anemia Panel: No results for input(s): VITAMINB12, FOLATE, FERRITIN, TIBC, IRON, RETICCTPCT in the last 72 hours. Sepsis Labs: No results for input(s): PROCALCITON, LATICACIDVEN in the last 168 hours.  Recent Results (from the past 240 hour(s))  Body fluid culture     Status: None   Collection Time: 09/23/17 12:33 PM  Result Value Ref Range Status   Specimen Description   Final    PERITONEAL Performed at [facility] [address], [place], [place] [address]    Special Requests   Final    NONE Performed at [facility], [place] [address], [place], [place] [address]    Gram Stain NO WBC SEEN NO ORGANISMS SEEN   Final   Culture   Final    NO GROWTH 3 DAYS Performed at [facility], [place] [address], [place], [place] [address]    Report Status 09/26/2017 FINAL  Final         Radiology Studies: Dg Chest 1 View  Result Date: 09/26/2017 CLINICAL DATA:  Status post right thoracentesis. EXAM: CHEST  1 VIEW COMPARISON:  Radiograph of September 24, 2017. FINDINGS: The heart size and mediastinal contours are within normal limits. Right lung is clear. Right internal jugular Port-A-Cath is unchanged in position. Stable moderate left pleural effusion is noted which most likely is loculated. Probable underlying atelectasis or infiltrate is noted. The visualized skeletal structures are unremarkable. IMPRESSION: Stable moderate probable loculated left pleural effusion with underlying atelectasis or infiltrate. Electronically Signed   By: Marijo Conception, M.D.   On: 09/26/2017 15:45   Ct Chest W Contrast  Result Date: 09/26/2017 CLINICAL DATA:  Colon cancer diagnosed in 2019. Chemotherapy ongoing. Chest pain with shortness of breath. EXAM: CT CHEST WITH CONTRAST TECHNIQUE: Multidetector CT imaging of the chest was performed during intravenous contrast administration. CONTRAST:  75mL ISOVUE-300 IOPAMIDOL (ISOVUE-300) INJECTION 61% COMPARISON:  Portable chest 09/24/2017. Chest CT 07/06/2017. Outside abdominal CT 07/28/2017 and additional abdominal CT 09/01/2017. FINDINGS: Cardiovascular: Study was not performed as a dedicated CTA, and there is suboptimal opacification of the pulmonary arteries. However, there are several ill-defined filling defects within the peripheral pulmonary arteries bilaterally, at least moderately suspicious for pulmonary embolism, possibly subacute in age. The central pulmonary arteries are patent. No systemic arterial abnormalities are identified. There is a partial filling defect along the  posterior wall of the left brachiocephalic vein (best seen on the coronal and sagittal images), suspicious for adherent thrombus. There is possible thrombus within the right external jugular vein and the SVC, the latter surrounding the Port-A-Cath tip. No definite venous occlusion, although limited opacification of the subclavian veins. Right IJ Port-A-Cath extends to the superior cavoatrial junction. The heart size is normal. There is no pericardial effusion. Mediastinum/Nodes: Evaluation limited by extensive soft tissue edema. No enlarged mediastinal, hilar or axillary lymph nodes are seen. The thyroid gland, trachea and esophagus demonstrate no significant findings. Lungs/Pleura: There are new large dependent pleural effusions bilaterally (new from prior chest CT and enlarged from more recent abdominal CT). There is resulting dependent atelectasis in both lungs. In addition, there is asymmetric patchy airspace disease which is greatest within the right upper and lower lobes. No focal pulmonary nodule or endobronchial lesion seen. Upper abdomen: Multifocal hepatic metastatic disease is similar in size to the most recent study. Largest lesion posteriorly in the right lobe measures 5.0 x 4.5 cm on image 92/2 (previously 5.1 x  4.1 cm). There are new thin calcifications along the right hemidiaphragm. Extensive peritoneal tumor in the left upper quadrant is lower in density and less well-defined, likely partially treated. There is a moderate amount of ascites. There is slightly increased scalloping of the liver and spleen, attributed to peritoneal disease. There is increased soft tissue edema throughout subcutaneous fat. Musculoskeletal/Chest wall: Anasarca with increased generalized soft tissue edema. No osseous metastases identified. IMPRESSION: 1. Anasarca with increasing generalized soft tissue edema, enlarging bilateral pleural effusions and ascites. 2. At least moderate suspicion of bilateral subsegmental  pulmonary emboli, possibly subacute in age. Nonocclusive/adherent thrombus along the posterior wall of the left brachiocephalic vein with possible additional nonocclusive thrombi in the right internal jugular vein and SVC. Limited opacification of the subclavian veins. No signs of right heart strain. Given the patient's upper extremity and neck swelling, consider upper extremity venous doppler evaluation. 3. Similar appearance of treated multifocal hepatic metastatic disease. Peritoneal tumor appears less obvious, likely partly treated. 4. Critical Value/emergent results were called by telephone at the time of interpretation on 09/26/2017 at 9:30 am to Dr. Betsy Coder , who verbally acknowledged these results. Electronically Signed   By: Richardean Sale M.D.   On: 09/26/2017 09:41   US Thoracentesis Asp Pleural Space W/img Guide  Result Date: 09/26/2017 INDICATION: Metastatic rectal cancer, dyspnea, bilateral pleural effusions; request made for diagnostic and therapeutic right thoracentesis. EXAM: ULTRASOUND GUIDED DIAGNOSTIC AND THERAPEUTIC RIGHT THORACENTESIS MEDICATIONS: None COMPLICATIONS: None immediate. PROCEDURE: An ultrasound guided thoracentesis was thoroughly discussed with the patient and questions answered. The benefits, risks, alternatives and complications were also discussed. The patient understands and wishes to proceed with the procedure. Written consent was obtained. Ultrasound was performed to localize and mark an adequate pocket of fluid in the right chest. The area was then prepped and draped in the normal sterile fashion. 1% Lidocaine was used for local anesthesia. Under ultrasound guidance a 6 Fr Safe-T-Centesis catheter was introduced. Thoracentesis was performed. The catheter was removed and a dressing applied. FINDINGS: A total of approximately 1.1 liters of bloody fluid was removed. Samples were sent to the laboratory as requested by the clinical team. IMPRESSION: Successful  ultrasound guided diagnostic and therapeutic right thoracentesis yielding 1.1 liters of pleural fluid. Read by: Rowe Robert, PA-C Electronically Signed   By: Marybelle Killings M.D.   On: 09/26/2017 15:24     Scheduled Meds: . fentaNYL  150 mcg Transdermal Q72H  . fluconazole  100 mg Oral Daily  . ipratropium-albuterol  3 mL Nebulization TID  . pantoprazole  40 mg Oral Daily  . potassium chloride  20 mEq Oral TID   Continuous Infusions: . sodium chloride 25 mL/hr at 09/26/17 1407  . albumin human    . heparin 1,200 Units/hr (09/27/17 0318)     LOS: 5 days   Reyne Dumas, MD [facility]   If 7PM-7AM, please contact night-coverage [email or web address] Password [facility] 09/27/2017, 10:45 AM

## 2017-09-27 NOTE — Progress Notes (Addendum)
BP 92/27 HR 142 O2 sat 92% on 5L per Natalia. Dr Allyson Sabal has been paged to make aware. Patient denies any discomfort  stating she feels fine. New orders for 561ml NS bolus per Dr. Allyson Sabal.

## 2017-09-27 NOTE — Progress Notes (Signed)
ANTICOAGULATION CONSULT NOTE - Follow Up Consult  Pharmacy Consult for Heparin Indication: DVT  No Known Allergies  Patient Measurements: Height: 5' 2.99" (160 cm) Weight: 115 lb 15.4 oz (52.6 kg) IBW/kg (Calculated) : 52.38 Heparin Dosing Weight: 52.6 kg  Vital Signs: Temp: 97.7 F (36.5 C) (03/26 1700) Temp Source: Oral (03/26 1700) BP: 113/61 (03/26 1700) Pulse Rate: 137 (03/26 1700)  Labs: Recent Labs    09/25/17 0630 09/26/17 0538 09/26/17 1620 09/27/17 0152 09/27/17 0546  HGB  --  6.3*  --   --  9.6*  HCT  --  20.2*  --   --  30.3*  PLT  --  234  --   --  243  APTT  --   --  33 64*  --   HEPARINUNFRC  --   --  >2.20* 1.82*  --   CREATININE 0.65 0.49  --   --  0.47    Estimated Creatinine Clearance: 87.4 mL/min (by C-G formula based on SCr of 0.47 mg/dL).   Medications:  Infusions:  . sodium chloride 25 mL/hr at 09/26/17 1407  . albumin human    . heparin 1,200 Units/hr (09/27/17 1236)    Assessment: 33 yoF with advanced rectal cancer, previously on apixaban.  CT (09/26/17) shows nonocclusive thrombus at the left brachiocephalic, right internal jugular, and SVC.  Pharmacy is now consulted to transition to Heparin IV.  Today, 09/27/2017: Heparin level at 10:00 was not drawn. Heparin was off from 1144 to 1236 during thoracentesis. No bleeding or complications from procedure today are reported. RN reports no infusion issues with IV, running at 12 ml/hr.  Port site sometimes does not have good blood return.  RN has asked lab to draw labs. 18:30 HL 0.88 (elevation likely d/t recent Apixaban), APTT 74, therapeutic  Goal of Therapy:  Heparin level 0.3-0.7 units/ml aPTT 66-102 seconds Monitor platelets by anticoagulation protocol: Yes   Plan:   Continue heparin IV infusion at 1200 units/hr  Heparin level 6 hours to confirm therapeutic level  Daily heparin level and CBC   Gretta Arab PharmD, BCPS Pager [phone] 09/27/2017 6:54 PM

## 2017-09-27 NOTE — Progress Notes (Signed)
IP PROGRESS NOTE  Subjective:   Amber Silva reports dyspnea.  She had partial improvement following the thoracentesis yesterday.  The mouth and lips burn.  Objective: Vital signs in last 24 hours: Blood pressure 106/82, pulse (!) 111, temperature 97.6 F (36.4 C), temperature source Axillary, resp. rate (!) 28, height 5' 2.99" (1.6 m), weight 115 lb 15.4 oz (52.6 kg), SpO2 99 %.  Intake/Output from previous day: 03/25 0701 - 03/26 0700 In: 2152.6 [I.V.:1700.1; Blood:452.5] Out: 400 [Urine:200; Stool:200]  Physical Exam: HEENT: Erythema at the inner lower lip, no thrush Cardiac: Regular rate and rhythm, tachycardia Lungs: End inspiratory rales at the upper chest bilaterally, decreased breath sounds at the lower posterior chest bilaterally  abdomen: Distended, diffuse tenderness, palpable liver edge versus abdominal wall tumor at the right and mid abdomen, colostomy with [place] stool, firm masslike fullness surrounding the ostomy Vascular: Trace edema at the left lower leg, both arms, and face.  Edema at the right lower neck and right upper anterior chest appears improved Skin: Sacral decubitus covered with a dressing  Portacath/PICC-without erythema  Lab Results: Recent Labs    09/26/17 0538 09/27/17 0546  WBC 5.1 7.1  HGB 6.3* 9.6*  HCT 20.2* 30.3*  PLT 234 243    BMET Recent Labs    09/26/17 0538 09/27/17 0546  NA 131* 133*  K 3.4* 3.1*  CL 100* 100*  CO2 22 23  GLUCOSE 91 105*  BUN 6 5*  CREATININE 0.49 0.47  CALCIUM 8.4* 8.0*    Medications: I have reviewed the patient's current medications.  Assessment/Plan: 1. Rectal cancer July 2018  Colonoscopy 01/25/2017-biopsy showedinvasive moderately differentiated adenocarcinoma with mucinous features.   CT chest/abdomen/pelvis 02/11/2017-very large ulcerated rectal mass with severe wall thickening over the distal 11.2 cm of the rectum extending to the anus with surrounding perirectal adenopathy and pelvic sidewall  adenopathy.At least 2 metastatic lesions in the right hepatic lobe.   MRI of the pelvis 02/14/2017-T4b,N2bulky rectal mass with deep infiltrative invasion of the bilateral pelvic sidewalls, presacral space, mesorectal fascia, distal branches of the internal iliac vasculature and sacral plexus. There was abnormal signal intensity along both sciatic nerves possibly representing perineural extension. The mass was noted to approximate the posterior vaginal wall without definite invasion.   PET scan 02/15/2017-colorectal malignancy with mesorectal fascia infiltration and 2 FDG avid hepatic metastases. Regional lymphadenopathy found on prior imaging was not FDG avid on the PET scan.   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  Foundation 1-KRAS/NRASwild-type; microsatellite stable; tumor mutational burden 3; BRCA1 rearrangement intron 2  4 cycles of FOLFIRINOX9/11/2016 through 04/27/2017.   CT abdomen/pelvis 05/06/2017-large rectal mass and known hepatic metastases similar to recent comparison.  Status postpartial course of radiation/Xeloda.   CTs 07/12/2017-large necrotic invasive rectal mass; increased size of known liver metastatic lesions; interval increase in size of peritoneal implants; new development of moderate volume abdominal ascites; gas again present within the anterior rectal wall closely opposing the gas-filled vagina. Rectovaginal fistula not excluded.   Admitted to [facility] 08/12/2017 with abdominal pain and swelling  CT abdomen/pelvis 08/11/2017-large rectal mass that appeared necrotic and with discrete tract extending toward the vagina/perineum; interval development of massive ascites; diffuse caking of the omentum; descending colostomy bulging due to peritoneal metastases; hepatic metastases measuring up to 4.7 cm in the upper right liver; large thrombus in the left femoral and common femoral veins.  Cycle 1 FOLFIRI/panitumumab 08/18/2017  CT  08/24/2017-enlargement of liver lesions and peritoneal tumor burden  Cycle 2  FOLFIRI/panitumumab 09/02/2017  Cycle 3 FOLFIRI/Panitumumab 09/15/2017 2. Left lower extremity DVT.   Pulmonary emboli noted on CT 09/26/2017, anticoagulation changed to heparin 3. Ascitessecondary to #1. Status post repeat paracentesis procedures 4. Tachycardia-likely secondary to anemia, intravascular depletion, critical illness 5. Anemia secondary to chronic disease, chemotherapy/radiation, and phlebotomy. Red cell transfusion 08/19/2017, 09/04/2017 6. Leukopenia secondary to chemotherapy 7. Pain secondary to metastatic rectal cancer.On Duragesic patch and oxycodone prior to hospital admission 8. Diarrhea-likely secondary to irinotecan 9. Facial/arm edema -clinical evidence of upper extremity venous occlusion  Upper extremity Dopplers negative on 09/26/2017  CT 09/26/2017-nonocclusive thrombus at the left brachiocephalic, right internal jugular, and SVC 10. Dyspnea- multifactorial-anemia, large pleural effusions, pulmonary emboli  Status post a palliative right thoracentesis 09/26/2017  Amber Silva has increased respiratory distress over the past several days.  This is likely secondary to pulmonary embolism, large bilateral effusions, and airspace disease.  She underwent a palliative right thoracentesis yesterday.  We will arrange for a left thoracentesis today.  I discussed the poor prognosis with Amber Silva this morning.  I began a discussion regarding CPR and ACLS issues.  She continues to feel she will improve.  She is not ready to discuss CODE STATUS and hospice care at present.  I discussed the case with her mother by telephone.  I explained the poor prognosis and expected survival of weeks.  Her mother appears to understand the prognosis.  She indicated her preference is to have Amber Silva return home to spend time with her children.  We will follow-up on the pleural fluid cytology.  If this confirms metastatic  rectal cancer I will strongly recommend Hospice care.   Recommendations: 1.  Palliative left thoracentesis today 2.  Continue heparin anticoagulation 3.  Continue discussion with providers regarding goals of care      LOS: 5 days   Betsy Coder, MD   09/27/2017, 7:28 AM

## 2017-09-27 NOTE — Procedures (Signed)
Ultrasound-guided diagnostic and therapeutic left thoracentesis performed yielding 1.3 liters of bloody fluid. No immediate complications. Follow-up chest x-ray pending.The fluid was sent to the lab for cytology.

## 2017-09-27 NOTE — Progress Notes (Signed)
ANTICOAGULATION CONSULT NOTE - Follow Up  Pharmacy Consult for IV heparin - transition from apixaban Indication: DVT  No Known Allergies  Patient Measurements: Height: 5' 2.99" (160 cm) Weight: 115 lb 15.4 oz (52.6 kg) IBW/kg (Calculated) : 52.38 Heparin Dosing Weight: 52.6 kg  Vital Signs: Temp: 98.1 F (36.7 C) (03/26 1050) Temp Source: Oral (03/26 1050) BP: 122/94 (03/26 1207) Pulse Rate: 139 (03/26 1050)  Labs: Recent Labs    09/25/17 0630 09/26/17 0538 09/26/17 1620 09/27/17 0152 09/27/17 0546  HGB  --  6.3*  --   --  9.6*  HCT  --  20.2*  --   --  30.3*  PLT  --  234  --   --  243  APTT  --   --  33 64*  --   HEPARINUNFRC  --   --  >2.20* 1.82*  --   CREATININE 0.65 0.49  --   --  0.47    Estimated Creatinine Clearance: 87.4 mL/min (by C-G formula based on SCr of 0.47 mg/dL).   Medical History: Past Medical History:  Diagnosis Date  . Ascites   . Bronchitis   . Colon cancer ([place])   . Pregnancy induced hypertension   . S/P colostomy ([facility])     Medications:  Scheduled:  . fentaNYL  150 mcg Transdermal Q72H  . fluconazole  100 mg Oral Daily  . ipratropium-albuterol  3 mL Nebulization TID  . lidocaine      . pantoprazole  40 mg Oral Daily  . potassium chloride  20 mEq Oral TID   Infusions:  . sodium chloride 25 mL/hr at 09/26/17 1407  . albumin human    . heparin 1,200 Units/hr (09/27/17 1236)    Assessment: 27 yo female with advanced metastatic rectal cancer with poor prognosis per Dr. Gearldine Shown note. Md suspects has venous obstruction secondary to thrombus involving the superior vena cava/subclavian veins. CT of chest revealed large bilateral pleural effusions and persistent tumor involving liver and abdominal cavity. To change apixaban 5mg  BID patient was taking PTA to IV heparin per pharmacy dosing. Hgb dropped today to 6.3 from 8.8. Plts stable WNL. Note patient did not receive dose of apixaban 5mg  today at 1000 as schedule; as a result, IV  heparin can start now without a bolus. Will order baseline aPTT and heparin level to be drawn prior to start of IV heparin - note that heparin level likely to be elevated due to apixaban given last PM. After IV heparin started, plan to continue to check aPTT and heparin level until they correlate and then heparin levels will only need to be checked.  Today, 09/27/17  APTT/HL were supposed to have been drawn at 10am today but levels were not drawn and then patient went to IR for thoracentesis and heparin was off from 1144 to 1236. Will retime levels for 1830 this evening  Goal of Therapy:  Heparin level 0.3-0.7 units/ml aPTT 66-102 seconds Monitor platelets by anticoagulation protocol: Yes   Plan:  1) IV heparin restarted at previous rate of 1200 units/hr after thoracentesis 2) Recheck HL and aPTT 6 hours after UFH restarted  Adrian Saran, PharmD, BCPS Pager [phone] 09/27/2017 12:53 PM

## 2017-09-27 NOTE — Progress Notes (Addendum)
Attempted to draw labs from port unsucessful, patient reports this is not a new problem. Phlebotomy to draw labs

## 2017-09-28 ENCOUNTER — Inpatient Hospital Stay (HOSPITAL_COMMUNITY): Payer: Medicaid Other

## 2017-09-28 ENCOUNTER — Inpatient Hospital Stay (HOSPITAL_COMMUNITY): Payer: Medicaid Other | Admitting: Anesthesiology

## 2017-09-28 DIAGNOSIS — I469 Cardiac arrest, cause unspecified: Secondary | ICD-10-CM

## 2017-09-28 DIAGNOSIS — Z978 Presence of other specified devices: Secondary | ICD-10-CM

## 2017-09-28 DIAGNOSIS — Z7189 Other specified counseling: Secondary | ICD-10-CM

## 2017-09-28 DIAGNOSIS — R180 Malignant ascites: Secondary | ICD-10-CM

## 2017-09-28 DIAGNOSIS — J9601 Acute respiratory failure with hypoxia: Secondary | ICD-10-CM

## 2017-09-28 DIAGNOSIS — J90 Pleural effusion, not elsewhere classified: Secondary | ICD-10-CM

## 2017-09-28 DIAGNOSIS — J9691 Respiratory failure, unspecified with hypoxia: Secondary | ICD-10-CM

## 2017-09-28 LAB — COMPREHENSIVE METABOLIC PANEL
ALBUMIN: 2.3 g/dL — AB (ref 3.5–5.0)
ALT: 9 U/L — ABNORMAL LOW (ref 14–54)
AST: 31 U/L (ref 15–41)
Alkaline Phosphatase: 158 U/L — ABNORMAL HIGH (ref 38–126)
Anion gap: 12 (ref 5–15)
BUN: 6 mg/dL (ref 6–20)
CHLORIDE: 100 mmol/L — AB (ref 101–111)
CO2: 18 mmol/L — AB (ref 22–32)
Calcium: 8.1 mg/dL — ABNORMAL LOW (ref 8.9–10.3)
Creatinine, Ser: 0.53 mg/dL (ref 0.44–1.00)
GFR calc Af Amer: >60 mL/min (ref >60)
GLUCOSE: 123 mg/dL — AB (ref 65–99)
POTASSIUM: 4.4 mmol/L (ref 3.5–5.1)
SODIUM: 130 mmol/L — AB (ref 135–145)
Total Bilirubin: 0.5 mg/dL (ref 0.3–1.2)
Total Protein: 5.1 g/dL — ABNORMAL LOW (ref 6.5–8.1)

## 2017-09-28 LAB — BLOOD GAS, ARTERIAL
Acid-base deficit: 13 mmol/L — ABNORMAL HIGH (ref 0.0–2.0)
Bicarbonate: 14.5 mmol/L — ABNORMAL LOW (ref 20.0–28.0)
Drawn by: 270211
FIO2: 1
O2 SAT: 68.9 %
PCO2 ART: 42 mmHg (ref 32.0–48.0)
Patient temperature: 98.6
pH, Arterial: 7.166 — CL (ref 7.350–7.450)
pO2, Arterial: 49.2 mmHg — ABNORMAL LOW (ref 83.0–108.0)

## 2017-09-28 LAB — HEPARIN LEVEL (UNFRACTIONATED): HEPARIN UNFRACTIONATED: 0.74 [IU]/mL — AB (ref 0.30–0.70)

## 2017-09-28 LAB — CBC
HCT: 32.6 % — ABNORMAL LOW (ref 36.0–46.0)
HEMOGLOBIN: 10.1 g/dL — AB (ref 12.0–15.0)
MCH: 25.7 pg — ABNORMAL LOW (ref 26.0–34.0)
MCHC: 31 g/dL (ref 30.0–36.0)
MCV: 83 fL (ref 78.0–100.0)
Platelets: 290 10*3/uL (ref 150–400)
RBC: 3.93 MIL/uL (ref 3.87–5.11)
RDW: 23.9 % — ABNORMAL HIGH (ref 11.5–15.5)
WBC: 8.1 10*3/uL (ref 4.0–10.5)

## 2017-09-28 LAB — APTT: APTT: 78 s — AB (ref 24–36)

## 2017-09-28 MED ORDER — FENTANYL CITRATE (PF) 100 MCG/2ML IJ SOLN
75.0000 ug | Freq: Once | INTRAMUSCULAR | Status: AC
  Administered 2017-09-28: 25 ug via INTRAVENOUS

## 2017-09-28 MED ORDER — SUCCINYLCHOLINE CHLORIDE 20 MG/ML IJ SOLN
INTRAMUSCULAR | Status: DC | PRN
  Administered 2017-09-28: 100 mg via INTRAVENOUS

## 2017-09-28 MED ORDER — ORAL CARE MOUTH RINSE
15.0000 mL | OROMUCOSAL | Status: DC
Start: 2017-09-28 — End: 2017-09-28

## 2017-09-28 MED ORDER — ETOMIDATE 2 MG/ML IV SOLN
INTRAVENOUS | Status: DC | PRN
  Administered 2017-09-28: 16 mg via INTRAVENOUS

## 2017-09-28 MED ORDER — MIDAZOLAM HCL 2 MG/2ML IJ SOLN
INTRAMUSCULAR | Status: AC
  Filled 2017-09-28: qty 2

## 2017-09-28 MED ORDER — FENTANYL 100 MCG/HR TD PT72
200.0000 ug | MEDICATED_PATCH | TRANSDERMAL | Status: DC
  Administered 2017-09-28: 200 ug via TRANSDERMAL
  Filled 2017-09-28: qty 2

## 2017-09-28 MED ORDER — GLYCOPYRROLATE 0.2 MG/ML IJ SOLN: 0.2000 mg | INTRAMUSCULAR | Status: DC | PRN

## 2017-09-28 MED ORDER — FUROSEMIDE 10 MG/ML IJ SOLN: 40.0000 mg | Freq: Once | INTRAMUSCULAR | Status: DC

## 2017-09-28 MED ORDER — NOREPINEPHRINE BITARTRATE 1 MG/ML IV SOLN
0.0000 ug/min | Freq: Once | INTRAVENOUS | Status: DC
  Filled 2017-09-28: qty 4

## 2017-09-28 MED ORDER — MORPHINE 100MG IN NS 100ML (1MG/ML) PREMIX INFUSION
10.0000 mg/h | INTRAVENOUS | Status: DC
  Filled 2017-09-28: qty 100

## 2017-09-28 MED ORDER — ETOMIDATE 2 MG/ML IV SOLN
INTRAVENOUS | Status: AC
Start: 2017-09-28 — End: 2017-09-28
  Filled 2017-09-28: qty 10

## 2017-09-28 MED ORDER — FENTANYL CITRATE (PF) 100 MCG/2ML IJ SOLN
INTRAMUSCULAR | Status: AC
  Filled 2017-09-28: qty 2

## 2017-09-28 MED ORDER — MORPHINE SULFATE (PF) 4 MG/ML IV SOLN: 2.0000 mg | INTRAVENOUS | Status: DC | PRN

## 2017-09-28 MED ORDER — LORAZEPAM 2 MG/ML IJ SOLN
5.0000 mg/h | INTRAVENOUS | Status: DC
  Filled 2017-09-28: qty 25

## 2017-09-28 MED ORDER — MORPHINE BOLUS VIA INFUSION
5.0000 mg | INTRAVENOUS | Status: DC | PRN
  Filled 2017-09-28: qty 20

## 2017-09-28 MED ORDER — MIDAZOLAM HCL 2 MG/2ML IJ SOLN
2.0000 mg | Freq: Once | INTRAMUSCULAR | Status: AC
Start: 2017-09-28 — End: 2017-09-28
  Administered 2017-09-28: 2 mg via INTRAVENOUS

## 2017-09-28 MED ORDER — LORAZEPAM BOLUS VIA INFUSION
2.0000 mg | INTRAVENOUS | Status: DC | PRN
  Filled 2017-09-28: qty 5

## 2017-09-28 MED ORDER — CHLORHEXIDINE GLUCONATE 0.12% ORAL RINSE (MEDLINE KIT): 15.0000 mL | Freq: Two times a day (BID) | OROMUCOSAL | Status: DC

## 2017-09-28 MED FILL — Medication: Qty: 1 | Status: AC

## 2017-09-29 ENCOUNTER — Ambulatory Visit: Payer: Medicaid Other

## 2017-09-29 ENCOUNTER — Ambulatory Visit: Payer: Medicaid Other | Admitting: Nurse Practitioner

## 2017-09-29 ENCOUNTER — Other Ambulatory Visit: Payer: Medicaid Other

## 2017-09-29 NOTE — ED Provider Notes (Signed)
I responded to CODE BLUE request on this patient.  On arrival, patient is intubated and compressions are being done.  She has a PEA type rhythm.  End-tidal CO2 monitoring showed an end-tidal of around 25.  ACLS procedures were followed with epinephrine given every 3-5 minutes.  She is also given sodium bicarb.  Bedside ultrasound shows positive organized cardiac activity.  However she intermittently would go into asystole.  The primary care team spoke with the family and it was decided that she would be made comfort care.  Resuscitation efforts were discontinued.   Malvin Johns, MD 09/29/17 [phone]

## 2017-10-03 NOTE — Progress Notes (Signed)
ANTICOAGULATION CONSULT NOTE - Follow Up Consult  Pharmacy Consult for heparin Indication: DVT  No Known Allergies  Patient Measurements: Height: 5' 2.99" (160 cm) Weight: 115 lb 15.4 oz (52.6 kg) IBW/kg (Calculated) : 52.38 Heparin Dosing Weight:   Vital Signs: Temp: 97.7 F (36.5 C) (03/27 0431) Temp Source: Oral (03/27 0431) BP: 103/63 (03/27 0431) Pulse Rate: 143 (03/27 0431)  Labs: Recent Labs    09/25/17 0630  09/26/17 0538  09/27/17 0152 09/27/17 0546 09/27/17 1942 2017-10-12 0407  HGB  --    < > 6.3*  --   --  9.6*  --  10.1*  HCT  --   --  20.2*  --   --  30.3*  --  32.6*  PLT  --   --  234  --   --  243  --  290  APTT  --   --   --    < > 64*  --  74* 78*  HEPARINUNFRC  --   --   --    < > 1.82*  --  0.88* 0.74*  CREATININE 0.65  --  0.49  --   --  0.47  --   --    < > = values in this interval not displayed.    Estimated Creatinine Clearance: 87.4 mL/min (by C-G formula based on SCr of 0.47 mg/dL).   Medications:  Infusions:  . sodium chloride 25 mL/hr at 09/26/17 1407  . albumin human    . heparin 1,200 Units/hr (09/27/17 1236)    Assessment: Patient with PTT at goal again and heparin level above goal again.  PTT ordered with Heparin level until both correlate due to possible drug-lab interaction between oral anticoagulant (rivaroxaban, edoxaban, or apixaban) and anti-Xa level (aka heparin level)   Goal of Therapy:  Heparin level 0.3-0.7 units/ml aPTT 66-102 seconds Monitor platelets by anticoagulation protocol: Yes   Plan:  Continue heparin drip at current rate Recheck level with 3/28 AM labs  Tyler Deis, Shea Stakes [place] 12-Oct-2017,5:33 AM

## 2017-10-03 NOTE — Progress Notes (Signed)
Pt extubated per Dr Gilford Raid at bedside.  Charge RN/MD aware.

## 2017-10-03 NOTE — Progress Notes (Addendum)
1900 hrs. CODE blue in progress. ED MD running code. ACLS in progress. Pt was transferred from floor to ICU this pm for worsening status. Apparently, per nsg, had change of mental status with hypoxia and dyspnea. Upon arrival to ICU, pt had wide complex rhythm and PEA arrest. After 15 mins of coding, no pulse. NP spoke with mother and mother wanted code stopped so pt could "die with dignity.". Mother stated she did not want to "go through all that again" and to let her go in peace. Some electrical activity on monitor. Mother aware and elects comfort care. Code team departed. Versed and Fentanyl given, pt cleaned up and family brought to room. TOD pending at this time.  KJKG, NP [facility] Addendum: Asystole at 1938 hrs. Death certificate completed. Chaplain with family. Large amount of family at bedside. KJKG, NP [facility]

## 2017-10-03 NOTE — Progress Notes (Signed)
Daily Progress Note   Patient Name: Amber Silva       Date: October 26, 2017 DOB: 03/04/1991  Age: 27 y.o. MRN#: [id number] Attending Physician: Reyne Dumas, MD Primary Care Physician: [name]., MD Admit Date: 10/02/2017  Reason for Consultation/Follow-up: Establishing goals of care  Subjective: Notified by Dr. Benay Spice that patient, mother and Aunt would like to attempt to take patient home with Hospice.  Followed up with family for continued [place] discussion. Discussed Hospice philosophy of supporting patient's and families through dying process. Discussed how Hospice will focus more on managing symptoms with medication and avoiding readmission to Hospital. Patient stated her [place] for EOL would be to die at home.   Noted patient is on NRB. Discussed that patient would not be able to go home with NRB. She agrees to try opioid for SOB in attempt to decrease O2 to [place]. She is having continued diffuse pain requiring frequent PRN medication.  Also discussed Code Status. Patient continues to desire Full Code status. Mom and Aunt seem to understand implications more of full code, patient is still under belief that if she dies, then full code and resuscitation will bring her back awake if successful. We discussed that this would not allow her to meet her goals as stated of dying at home. She is reluctant to discuss this further. Noted that is she is able to go home with Hospice that they will continue to discuss code status with her.    ROS  Length of Stay: 6  Current Medications: Scheduled Meds:  . fentaNYL  150 mcg Transdermal Q72H  . fluconazole  100 mg Oral Daily  . ipratropium-albuterol  3 mL Nebulization TID  . pantoprazole  40 mg Oral Daily  . potassium chloride  20 mEq Oral TID     Continuous Infusions: . sodium chloride 25 mL/hr at 09/26/17 1407  . albumin human    . heparin 1,200 Units/hr (09/27/17 1236)    PRN Meds: HYDROmorphone (DILAUDID) injection, HYDROmorphone, ipratropium-albuterol, lip balm, loperamide, magic mouthwash, phenol, promethazine, promethazine, sodium chloride flush  Physical Exam  Cardiovascular:  tachycardic  Pulmonary/Chest:  Labored, SOB  Abdominal: She exhibits distension.  Colostomy in place  Neurological:  Awake, but sleepy  Psychiatric: She has a normal mood and affect. Her behavior is normal.  Nursing note and vitals reviewed.  Vital Signs: BP 103/63 (BP Location: Right Leg)   Pulse (!) 143   Temp 97.7 F (36.5 C) (Oral)   Resp 20   Ht 5' 2.99" (1.6 m)   Wt 52.6 kg (115 lb 15.4 oz)   SpO2 96%   BMI 20.55 kg/m  SpO2: SpO2: 96 % O2 Device: O2 Device: NRB O2 Flow Rate: O2 Flow Rate (L/min): 15 L/min  Intake/output summary:   Intake/Output Summary (Last 24 hours) at Oct 02, 2017 9735 Last data filed at 10-02-17 0600 Gross per 24 hour  Intake 1116 ml  Output -  Net 1116 ml   LBM: Last BM Date: 09/26/17 Baseline Weight: Weight: 52.6 kg (115 lb 15.4 oz) Most recent weight: Weight: 52.6 kg (115 lb 15.4 oz)       Palliative Assessment/Data: PPS: 20%    Flowsheet Rows     Most Recent Value  Intake Tab  Referral Department  Hospitalist  Unit at Time of Referral  Med/Surg Unit  Palliative Care Primary Diagnosis  Cancer  Date Notified  09/23/17  Palliative Care Type  Return patient Palliative Care  Reason for referral  Pain  Date of Admission  09/24/2017  Date first seen by Palliative Care  09/24/17  # of days Palliative referral response time  1 Day(s)  # of days IP prior to Palliative referral  1  Clinical Assessment  Psychosocial & Spiritual Assessment  Palliative Care Outcomes      Patient Active Problem List   Diagnosis Date Noted  . Encounter for palliative care   . Nausea & vomiting  09/20/2017  . Sinus tachycardia 09/07/2017  . Port-A-Cath in place 09/15/2017  . Protein-calorie malnutrition, severe 09/08/2017  . Palliative care by specialist   . Goals of care, counseling/discussion   . Pressure injury of skin 09/02/2017  . Constipation   . Ascites 08/27/2017  . Abdominal pain 08/26/2017  . Rectal cancer ([place]) 08/18/2017  . Malnutrition of moderate degree 08/15/2017  . Malignant ascites 08/12/2017  . Abdominal distention 08/12/2017  . Hyponatremia 08/12/2017    Palliative Care Assessment & Plan   Patient Profile: Amber Silva a 27 y.o.femalewith medical history significant ofstage IV rectal cancer with metastatic disease, ascites,whowas recently discharged on 09/09/17 was sent with a direct admission from her oncologist office today for management of abdominal pain, nausea,vomiting and diarrhea.Patient admitted for pain control. PMT requested to visit again for continued [place].  Assessment/Recommendations/Plan   Increase Fentanyl patch to 28mcg/hr based on equilanalgesic dosing using last 24 hrs of prn requirements, recommend po hydromorphone 4-8mg  q2hr prn pain or SOB  Referral to care management for home with [facility] per Dr. Benay Spice  Continue FULL CODE STATUS- patient may be more willing to transition code status once she is at home and discusses with Hospice, OR it may come down to her Mother having to make that decision once she is unresponsive  They are hopeful for d/c Home with Hospice- however, given declining respiratory status and desire for FULL CODE this may be challenging- will need to stabilize respiratory as much as possible before d/c. She agrees to attempt to use prn opioid for assistance in titrating O2 needs down   Goals of Care and Additional Recommendations:  Limitations on Scope of Treatment: No Lab Draws  Code Status:  Full code  Prognosis:   < 2 weeks  Discharge Planning:  To Be Determined- patient and  family are hopeful for home with Hospice, however, given her worsening respiratory function and  desire for FULL CODE status, this may be a challenge  Care plan was discussed with patient, Mom and Aunt  Thank you for allowing the Palliative Medicine Team to assist in the care of this patient.   Time In: 0945 Time Out: 1030 Total Time 45 mins Prolonged Time Billed no      Greater than 50%  of this time was spent counseling and coordinating care related to the above assessment and plan.  Mariana Kaufman, AGNP-C Palliative Medicine   Please contact Palliative Medicine Team phone at [phone] for questions and concerns.

## 2017-10-03 NOTE — Progress Notes (Signed)
Patient found in bed with non re breather mask down on face. Mask placed back on patient and O2 sats would not register. Sats at 60-80 % patient is alert and oriented. BP 98/36 HR 138. Dr Allyson Sabal paged to make aware of above assessment.

## 2017-10-03 NOTE — Transfer of Care (Signed)
Immediate Anesthesia Transfer of Care Note  Patient: Amber Silva  Procedure(s) Performed: AN AD [place]  Patient Location: ICU  Anesthesia Type:intubation in ICU  Level of Consciousness: Patient remains intubated per anesthesia plan  Airway & Oxygen Therapy: Patient placed on Ventilator (see vital sign flow sheet for setting)  Post-op Assessment: Report given to RN  Post vital signs: Reviewed and stable  Last Vitals:  Vitals Value Taken Time  BP    Temp    Pulse    Resp 0 October 01, 2017  7:39 PM  SpO2    Vitals shown include unvalidated device data.  Last Pain:  Vitals:   10-01-17 1418  TempSrc: Oral  PainSc:       Patients Stated Pain Goal: 0 (09/81/19 1478)  Complications: No apparent anesthesia complications

## 2017-10-03 NOTE — Progress Notes (Addendum)
PROGRESS NOTE    Alexys Lobello  RSW:[id number] DOB: 1990/09/10 DOA: 09/25/2017 PCP: [name]., MD   Principal Problem:   Abdominal pain Active Problems:   Malignant ascites   Abdominal distention   Malnutrition of moderate degree   Rectal cancer (HCC)   Nausea & vomiting   Sinus tachycardia   Encounter for palliative care   Brief Narrative: Mckinze Poirier is a 27 y.o. 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  Patient admitted for pain control.  Please note the patient has refused palliative and hospice services. Now with progressive respiratory failure    Assessment and plan  1)acute hypoxic respiratory failure,Bil Pulm Embolism and Rt internal jugular and SVC clot--venous Dopplers of the upper extremities without acute DVT, however CT chest suggest right internal jugular and SVC clot patient was started on IV heparin on 09/26/17 per Dr. Learta Codding the oncologist. Continue heparin drip , and change to DOAC at discharge,  2)acuteHypoxic Respiratory Failure with bilateral pleural effusions status post bilateral thoracentesis-  she has increasing dyspnea, hypoxia and increasing work of breathing, CT chest from 09/26/2017 suggest bilateral pleural effusions right more than left patient is status post ultrasound-guided thoracentesis with removal of 1.1 L of fluid from the right pleural space on 09/26/2017 . Status post left pleural  thoracentesis 3/26 with removal of 1.3 L of bloody fluid. Continues to worsen from a pulmonary standpoint. Discussed with the patient's aunt and mother.She could have re expnasion pulmonary edema , lymphangitis carcinomatosis . Unable to diuresis due to borderline low BP.May need to move to stepdown on  BiPAP, with need for serial ABGs, and nothing by mouth status. Family has not agreed to changing patient's room to stepdown. They are aware that the patient is getting hypoxic with sats in the 70s on 100% non rebreather. They would like patient to stay in her current room, they will try talking to her to change her CODE STATUS.  3)Abdominal pain with nausea vomiting/recurrent malignant ascites: Secondary to stage IV metastatic rectal cancer with recurrent ascites. Underwent palliative paracentesis on 09/23/17 with removal of 3.1 L of bloody ascites fluid . She had multiple paracentesis in the last few weeks.  Continue pain management.  Palliative care consult appreciated,    continue opiates including Dilaudid, continue antiemetics as needed  4)Stage IV Rectal cancer/S/P prior Colostomy.:She has  completed cycle 3 of FOLFIRI/Panitumumab on 09/15/2017 as per the oncology note.Dr Benay Spice following, Patient has very poor prognosis.  She is still optimistic for cure of her terminal stage cancer.  Palliative care/oncology continuing discussion regarding goals of care /comfort . Patient still wants everything to be done , Ostomy Concerns-at ostomy area looks irritated with some foul-smelling ordor,continue ostomy care as per RN, she continues to have ostomy output though.  5)Hypotension and hemodynamic concerns- BP runs low at baseline, patient is tachycardic, received IV albumin to help intravascular volume and by so doing help BP, and also to minimize third spacing/interstitial fluid, unable to tolerate metoprolol for tachycardia due to low BP  6)History of DVT:  Has history of left femoral/common femoral veins DVT, holding Eliquis, patient is now on IV heparin due to bilateral PE and right internal jugular and SVC clot  7)Acute on  chronic anemia:  patient with normocytic anemia in  the setting of metastatic cancer and ongoing chemotherapy, patient is tachycardic, blood pressure is soft, hemoglobin improved  from 6.3,>9.6>10.1  after transfusion of  1 unit of packed cells, follow CBC  8)Moderate protein calorie malnutrition: Nutrition consultation.  Encourage oral intake.  9)Leukocytosis: Most likely reactive.  Paracentesis fluid analysis and clinical exam does not suggest SBP.  Patient is afebrile  10)Social/Ethics-palliative care consult dated 09/24/2017 appreciated, patient remains a full code, patient remains full scope of treatment, no de-escalation, no limitations to patient treatment at this time, discussed with family would like to take patient home with hospice. Patient is agreeable to do so but would like to be a full code. Requested case management to set up hospice services at home.   11) hypokalemia likely secondary to poor oral intake, repleted  DVT prophylaxis: Iv Heparin Family Communication: None present at the bedside Disposition Plan: Home after  hospice services are set up  Consultants: Oncology, palliative care  Procedures:  Paracentesis on 09/23/17 Rt Sided thoracentesis on 09/26/2017  Antimicrobials:None  Subjective: Continues to decline clinically, currently tachycardic on 100% FiO2 and desaturating as per RN  Objective: Vitals:   10-11-17 0434 October 11, 2017 0448 10-11-17 0835 10-11-17 1009  BP:    90/75  Pulse:    (!) 138  Resp:    18  Temp:    (!) 97.5 F (36.4 C)  TempSrc:    Oral  SpO2: (!) 83% 97% 96% (!) 89%  Weight:      Height:        Intake/Output Summary (Last 24 hours) at October 11, 2017 1050 Last data filed at 10/11/17 0600 Gross per 24 hour  Intake 1116 ml  Output -  Net 1116 ml   Filed Weights   09/23/17 0618 09/23/17 2021  Weight: 52.6 kg (115 lb 15.4 oz) 52.6 kg (115 lb 15.4 oz)    Examination:  General exam: She looks chronically ill, speaking in sentences  HEENT: Hearing is adequate, pupils are reactive Lungs-diminished in bases right more than left, otherwise clear bilaterally,  CV- Sinus tachycardia, S1 & S2 ,   Port-A-Cath on the  right chest clean dry and intact Gastrointestinal system: Abdomen is distended, soft and tender. Hepatomegaly.colostomy bag with stool  Central nervous system: Alert and oriented. No focal neurological deficits.  Generalized weakness Extremities: Warm and dry,   distal peripheral pulses palpable. Skin: some swelling of upper chest and neck areas   Data Reviewed: I have personally reviewed following labs and imaging studies  CBC: Recent Labs  Lab 10/01/2017 1844 09/24/17 0634 09/24/17 1239 09/26/17 0538 09/27/17 0546 10-11-17 0407  WBC 3.2* 14.7* 10.3 5.1 7.1 8.1  NEUTROABS 2.5 13.4* 9.2*  --   --   --   HGB 9.0* 9.9* 8.8* 6.3* 9.6* 10.1*  HCT 28.4* 32.5* 28.4* 20.2* 30.3* 32.6*  MCV 84.3 88.3 87.9 89.4 82.3 83.0  PLT 159 209 200 234 243 573   Basic Metabolic Panel: Recent Labs  Lab 09/24/17 0634 09/25/17 0630 09/26/17 0538 09/27/17 0546 10-11-17 0718  NA 131* 128* 131* 133* 130*  K 4.4 4.1 3.4* 3.1* 4.4  CL 94* 95* 100* 100* 100*  CO2 21* 20* 22 23 18*  GLUCOSE 111* 114* 91 105* 123*  BUN 8 9 6  5* 6  CREATININE 0.73 0.65 0.49 0.47 0.53  CALCIUM 8.4* 8.2* 8.4* 8.0* 8.1*   GFR: Estimated Creatinine Clearance: 87.4 mL/min (by C-G formula based on SCr of 0.53 mg/dL). Liver Function Tests: Recent Labs  Lab 09/20/2017 1844  09/27/17 0546 25-Oct-2017 0718  AST 22 23 31   ALT 12* 11* 9*  ALKPHOS 177* 148* 158*  BILITOT 0.6 0.5 0.5  PROT 6.0* 5.6* 5.1*  ALBUMIN 1.8* 2.8* 2.3*   No results for input(s): LIPASE, AMYLASE in the last 168 hours. No results for input(s): AMMONIA in the last 168 hours. Coagulation Profile: No results for input(s): INR, PROTIME in the last 168 hours. Cardiac Enzymes: No results for input(s): CKTOTAL, CKMB, CKMBINDEX, TROPONINI in the last 168 hours. BNP (last 3 results) No results for input(s): PROBNP in the last 8760 hours. HbA1C: No results for input(s): HGBA1C in the last 72 hours. CBG: No results for input(s): GLUCAP in the last 168  hours. Lipid Profile: No results for input(s): CHOL, HDL, LDLCALC, TRIG, CHOLHDL, LDLDIRECT in the last 72 hours. Thyroid Function Tests: No results for input(s): TSH, T4TOTAL, FREET4, T3FREE, THYROIDAB in the last 72 hours. Anemia Panel: No results for input(s): VITAMINB12, FOLATE, FERRITIN, TIBC, IRON, RETICCTPCT in the last 72 hours. Sepsis Labs: No results for input(s): PROCALCITON, LATICACIDVEN in the last 168 hours.  Recent Results (from the past 240 hour(s))  Body fluid culture     Status: None   Collection Time: 09/23/17 12:33 PM  Result Value Ref Range Status   Specimen Description   Final    PERITONEAL Performed at [facility] [address] [place], [place] [address]    Special Requests   Final    NONE Performed at [facility], [place] [address] [place], [place] [address]    Gram Stain NO WBC SEEN NO ORGANISMS SEEN   Final   Culture   Final    NO GROWTH 3 DAYS Performed at [facility], [place] [address] [place], [place] [address]    Report Status 09/26/2017 FINAL  Final         Radiology Studies: Dg Chest 1 View  Result Date: 09/27/2017 CLINICAL DATA:  Post left thoracentesis EXAM: CHEST  1 VIEW COMPARISON:  Chest x-ray of 09/26/2017 FINDINGS: Much of the left pleural effusion has been evacuated after left thoracentesis. No left pneumothorax is seen. There is however small to moderate size right pleural effusion present. Right-sided Port-A-Cath tip lies near the expected SVC-RA junction and heart size is stable. IMPRESSION: 1. Evacuation of much of the left pleural effusion by thoracentesis. No pneumothorax on the left. 2. Right pleural effusion. Electronically Signed   By: Ivar Drape M.D.   On: 09/27/2017 12:40   Dg Chest 1 View  Result Date: 09/26/2017 CLINICAL DATA:  Status post right thoracentesis. EXAM: CHEST  1 VIEW COMPARISON:  Radiograph of September 24, 2017. FINDINGS: The heart size and mediastinal contours are  within normal limits. Right lung is clear. Right internal jugular Port-A-Cath is unchanged in position. Stable moderate left pleural effusion is noted which most likely is loculated. Probable underlying atelectasis or infiltrate is noted. The visualized skeletal structures are unremarkable. IMPRESSION: Stable moderate probable loculated left pleural effusion with underlying atelectasis or infiltrate. Electronically Signed   By: Marijo Conception, M.D.   On: 09/26/2017 15:45   US Thoracentesis Asp Pleural Space W/img Guide  Result Date: 09/27/2017 INDICATION: Patient with history of metastatic rectal cancer, bilateral pleural effusions, status post right thoracentesis on 09/26/17; now presents for diagnostic and therapeutic left thoracentesis. EXAM: ULTRASOUND GUIDED DIAGNOSTIC AND THERAPEUTIC LEFT THORACENTESIS MEDICATIONS: None COMPLICATIONS: None immediate. PROCEDURE: An ultrasound guided thoracentesis was thoroughly discussed with the patient and questions answered. The  benefits, risks, alternatives and complications were also discussed. The patient understands and wishes to proceed with the procedure. Written consent was obtained. Ultrasound was performed to localize and mark an adequate pocket of fluid in the left chest. The area was then prepped and draped in the normal sterile fashion. 1% Lidocaine was used for local anesthesia. Under ultrasound guidance a 6 Fr Safe-T-Centesis catheter was introduced. Thoracentesis was performed. The catheter was removed and a dressing applied. FINDINGS: A total of approximately 1.3 liters of bloody fluid was removed. Samples were sent to the laboratory as requested by the clinical team. IMPRESSION: Successful ultrasound guided diagnostic and therapeutic left thoracentesis yielding 1.3 liters of pleural fluid. Read by: Rowe Robert, PA-C Electronically Signed   By: Corrie Mckusick D.O.   On: 09/27/2017 12:14   US Thoracentesis Asp Pleural Space W/img Guide  Result Date:  09/26/2017 INDICATION: Metastatic rectal cancer, dyspnea, bilateral pleural effusions; request made for diagnostic and therapeutic right thoracentesis. EXAM: ULTRASOUND GUIDED DIAGNOSTIC AND THERAPEUTIC RIGHT THORACENTESIS MEDICATIONS: None COMPLICATIONS: None immediate. PROCEDURE: An ultrasound guided thoracentesis was thoroughly discussed with the patient and questions answered. The benefits, risks, alternatives and complications were also discussed. The patient understands and wishes to proceed with the procedure. Written consent was obtained. Ultrasound was performed to localize and mark an adequate pocket of fluid in the right chest. The area was then prepped and draped in the normal sterile fashion. 1% Lidocaine was used for local anesthesia. Under ultrasound guidance a 6 Fr Safe-T-Centesis catheter was introduced. Thoracentesis was performed. The catheter was removed and a dressing applied. FINDINGS: A total of approximately 1.1 liters of bloody fluid was removed. Samples were sent to the laboratory as requested by the clinical team. IMPRESSION: Successful ultrasound guided diagnostic and therapeutic right thoracentesis yielding 1.1 liters of pleural fluid. Read by: Rowe Robert, PA-C Electronically Signed   By: Marybelle Killings M.D.   On: 09/26/2017 15:24     Scheduled Meds: . fentaNYL  200 mcg Transdermal Q72H  . fluconazole  100 mg Oral Daily  . ipratropium-albuterol  3 mL Nebulization TID  . pantoprazole  40 mg Oral Daily  . potassium chloride  20 mEq Oral TID   Continuous Infusions: . sodium chloride 25 mL/hr at 09/26/17 1407  . albumin human    . heparin 1,200 Units/hr (09/27/17 1236)     LOS: 6 days   Reyne Dumas, MD [facility]   If 7PM-7AM, please contact night-coverage [email or web address] Password [email or web address] 2017-10-01, 10:50 AM

## 2017-10-03 NOTE — Progress Notes (Signed)
Report given to Sapna RN at the bedside.

## 2017-10-03 NOTE — Progress Notes (Signed)
Shortly after intubation, patient went into PEA arrest at [place]. Code cart already at bedside. Code Called overhead. CPR and ACLS initiated at time PEA arrest.  Anesthesia also at bedside assisting with patient. Mother and other family members outside of room being updated in regards to situation.

## 2017-10-03 NOTE — Progress Notes (Signed)
RT came to patients room to give PRN treatment. Patient sats were 73%  on 5 liters. RT placed patient on a venti mask at 50%. Patient sats only got up to 83%. Patient now has a non re breather at 15 liters. Patient is sating 96%. RN aware.

## 2017-10-03 NOTE — Progress Notes (Signed)
CPR stopped and code called.

## 2017-10-03 NOTE — Anesthesia Procedure Notes (Signed)
Procedure Name: Intubation Date/Time: 2017/10/01 6:55 PM Performed by: Anne Fu, CRNA Pre-anesthesia Checklist: Patient identified, Emergency Drugs available, Suction available, Patient being monitored and Timeout performed Patient Re-evaluated:Patient Re-evaluated prior to induction Oxygen Delivery Method: Circle system utilized Preoxygenation: Pre-oxygenation with 100% oxygen Induction Type: IV induction Ventilation: Mask ventilation without difficulty Laryngoscope Size: Mac and 4 Grade View: Grade I Tube type: Oral Tube size: 7.5 mm Number of attempts: 1 Airway Equipment and Method: Stylet Placement Confirmation: ETT inserted through vocal cords under direct vision,  positive ETCO2 and breath sounds checked- equal and bilateral Secured at: 21 cm Tube secured with: Tape Dental Injury: Teeth and Oropharynx as per pre-operative assessment  Comments:

## 2017-10-03 NOTE — Progress Notes (Addendum)
IP PROGRESS NOTE  Subjective:   Amber Silva has developed increased respiratory distress.  She reports burning at the tongue.  Pain is under adequate control. Objective: Vital signs in last 24 hours: Blood pressure 103/63, pulse (!) 143, temperature 97.7 F (36.5 C), temperature source Oral, resp. rate 20, height 5' 2.99" (1.6 m), weight 115 lb 15.4 oz (52.6 kg), SpO2 96 %.  Intake/Output from previous day: 03/26 0701 - 03/27 0700 In: 1116 [P.O.:840; I.V.:276] Out: -   Physical Exam: HEENT: Erythema at tip of the tongue, no thrush Cardiac: Regular rate and rhythm, tachycardia Lungs: Decreased breath sounds at the right lower chest, good air movement bilaterally abdomen: Distended, firm masslike fullness throughout the right and left abdomen, left lower quadrant ostomy Vascular: Trace edema at the left lower leg, and arms.  Facial edema appears improved Skin: Sacral decubitus covered with a dressing  Portacath/PICC-without erythema  Lab Results: Recent Labs    09/27/17 0546 10-27-2017 0407  WBC 7.1 8.1  HGB 9.6* 10.1*  HCT 30.3* 32.6*  PLT 243 290    BMET Recent Labs    09/27/17 0546 10/27/17 0718  NA 133* 130*  K 3.1* 4.4  CL 100* 100*  CO2 23 18*  GLUCOSE 105* 123*  BUN 5* 6  CREATININE 0.47 0.53  CALCIUM 8.0* 8.1*    Medications: I have reviewed the patient's current medications.  Assessment/Plan: 1. Rectal cancer July 2018  Colonoscopy 01/25/2017-biopsy showedinvasive moderately differentiated adenocarcinoma with mucinous features.   CT chest/abdomen/pelvis 02/11/2017-very large ulcerated rectal mass with severe wall thickening over the distal 11.2 cm of the rectum extending to the anus with surrounding perirectal adenopathy and pelvic sidewall adenopathy.At least 2 metastatic lesions in the right hepatic lobe.   MRI of the pelvis 02/14/2017-T4b,N2bulky rectal mass with deep infiltrative invasion of the bilateral pelvic sidewalls, presacral space,  mesorectal fascia, distal branches of the internal iliac vasculature and sacral plexus. There was abnormal signal intensity along both sciatic nerves possibly representing perineural extension. The mass was noted to approximate the posterior vaginal wall without definite invasion.   PET scan 02/15/2017-colorectal malignancy with mesorectal fascia infiltration and 2 FDG avid hepatic metastases. Regional lymphadenopathy found on prior imaging was not FDG avid on the PET scan.   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  Foundation 1-KRAS/NRASwild-type; microsatellite stable; tumor mutational burden 3; BRCA1 rearrangement intron 2  4 cycles of FOLFIRINOX9/11/2016 through 04/27/2017.   CT abdomen/pelvis 05/06/2017-large rectal mass and known hepatic metastases similar to recent comparison.  Status postpartial course of radiation/Xeloda.   CTs 07/12/2017-large necrotic invasive rectal mass; increased size of known liver metastatic lesions; interval increase in size of peritoneal implants; new development of moderate volume abdominal ascites; gas again present within the anterior rectal wall closely opposing the gas-filled vagina. Rectovaginal fistula not excluded.   Admitted to [facility] 08/12/2017 with abdominal pain and swelling  CT abdomen/pelvis 08/11/2017-large rectal mass that appeared necrotic and with discrete tract extending toward the vagina/perineum; interval development of massive ascites; diffuse caking of the omentum; descending colostomy bulging due to peritoneal metastases; hepatic metastases measuring up to 4.7 cm in the upper right liver; large thrombus in the left femoral and common femoral veins.  Cycle 1 FOLFIRI/panitumumab 08/18/2017  CT 08/24/2017-enlargement of liver lesions and peritoneal tumor burden  Cycle 2 FOLFIRI/panitumumab 09/02/2017  Cycle 3 FOLFIRI/Panitumumab 09/15/2017 2. Left lower extremity DVT.   Pulmonary emboli noted on CT 09/26/2017,  anticoagulation changed to heparin 3. Ascitessecondary to #1. Status post repeat  paracentesis procedures 4. Tachycardia-likely secondary to anemia, intravascular depletion, critical illness 5. Anemia secondary to chronic disease, chemotherapy/radiation, and phlebotomy. Red cell transfusion 08/19/2017, 09/04/2017 6. Leukopenia secondary to chemotherapy 7. Pain secondary to metastatic rectal cancer.On Duragesic patch and oxycodone prior to hospital admission 8. Diarrhea-likely secondary to irinotecan 9. Facial/arm edema -clinical evidence of upper extremity venous occlusion  Upper extremity Dopplers negative on 09/26/2017  CT 09/26/2017-nonocclusive thrombus at the left brachiocephalic, right internal jugular, and SVC 10. Dyspnea- multifactorial-anemia, large pleural effusions, pulmonary emboli  Status post a palliative right thoracentesis 09/26/2017  Status post a palliative left thoracentesis 09/27/2017  Amber Silva has advanced metastatic rectal cancer.  There is clinical evidence of disease progression.  She has developed respiratory distress and hypoxia over the past several days.  This may be related to lymphatic tumor spread in the lungs complicated by pulmonary emboli and pleural effusions/atelectasis.  A chest x-ray today is pending. I discussed the poor prognosis with Amber Silva, her mother, and aunt.  They understand the current evidence of disease progression and her poor performance status preclude further systemic therapy.  I recommend Hospice care.  She would like to return home to be with her family.  She agrees to a referral to [place].  We discussed CPR and ACLS issues.  She would like to remain on a full CODE STATUS for now.  Recommendations: 1.  Refer to [facility] for home care, potential transition to residential hospice 2.  Continue discussions regarding CODE STATUS 3.  Continue management of respiratory failure per the medical service, wean  oxygen as tolerated, consider trial of steroids if today's chest x-ray confirms progressive infiltrates 4.  Continue heparin anticoagulation with the plan to resume a  DOAC at discharge  I met with the patient's mother and aunt at the bedside this morning.  I met with the aunt last night.   LOS: 6 days   Betsy Coder, MD   September 29, 2017, 10:06 AM

## 2017-10-03 NOTE — Progress Notes (Signed)
   October 16, 2017 1900  Clinical Encounter Type  Visited With Family;Health care provider  Visit Type Initial;Code;Death  Referral From Nurse  Consult/Referral To Faith community  Spiritual Encounters  Spiritual Needs Emotional;Grief support  Stress Factors  Patient Stress Factors  (Family declined spiritual care services )    Name: [name], [name]  Location: ICU [facility] of Call: code blue and death. ---Stage IV rectal cancer  Chaplain called due to a code blue of a 27 year old [place] American female. The code was unsuccessful. When Chaplain arrived mother was walking towards the Pt's room. Family (mother) told chaplain that the family did not need spiritual care services and that her brother was the family's pastor. Chaplain respected the family's wishes.  However, if they change their mind about spiritual care services please page the number that follows.  [phone]

## 2017-10-03 NOTE — Significant Event (Addendum)
Rapid Response Event Note  Overview: Time Called: 7846 Arrival Time: 1809 Event Type: Respiratory Notified by [facility] that patient in 1340 needed rapid response. Upon arrival, patient was utilizing accessory muscles to breathe. Patient's respirations were 40s-50s.Patient on NRB. Patient alert and oriented x4. Patient stated breathing "was fair". Respiratory called at bedside. Respiratory provided a breathing treatment. MD Karleen Hampshire notified. Initial Focused Assessment: Neuro: Alert and oriented x4. Patient very weak but able to follow simple commands. Pupils 2-3+ reactive to light, round, and sluggish. Cardiac: ST HR 140s, S1 and S2 heard upon auscultation. Pulses in radial and pedal locations bilaterally were 1+. BP 90/64 (73) at 1826  Pulmonary: Breath sounds clear and diminished throughout all lung fields, RR 40s-50s, O2 Sats low 80s on NRB. Pulse Ox waveform poor. Placing pulse ox at different sites but yielded poor quality.   Interventions: Trx to ICU Obtain ABG  Upon arrival to ICU, patient began to decompensate. Patient became less responsive. ABG obtained. Anesthesia called to bedside to intubate at [place]. Code Cart placed at bedside. Mother at bedside communicating with patient. Patient and mother made decision to proceed with intubation. Heparin gtt stopped at 1832 to access IV for push line. Anesthesia intubated at bedside. 46mcg of Fentanyl given and 58mcg of etomidated given for intubation. MD Karleen Hampshire made aware of ABG results and CCM consult placed.      Dalina Samara C

## 2017-10-03 NOTE — Progress Notes (Addendum)
Patient transferred to 2W37 with rapid response,bedside nurse and respiratory  therapy. Family is at bedside and updated .

## 2017-10-03 NOTE — Progress Notes (Signed)
Pt has pulse and placed back on vent.

## 2017-10-03 NOTE — Progress Notes (Signed)
Dr Allyson Sabal returned call and made aware of O2 sats  And BP. Md spoke with family on phone. This writer calls rapid response for support .

## 2017-10-03 NOTE — Progress Notes (Signed)
Respiratory paged to assess patient and give respirator treatment per Dr. Ledell Peoples recommendations. Respiratory therapist is on the unit assessing patient and giving respiratory treatment.

## 2017-10-03 NOTE — Discharge Summary (Signed)
.. ..                          DISCHARGE SUMMARY  Name: Amber Silva MRN: [id number] DOB: Apr 09, 1991    ADMISSION DATE:  10/03/17 DATE OF DEATH:  Oct 09, 2017 TIME OF DEATH: 46   HISTORY OF PRESENT ILLNESS:  27 yr old Stage IV Rectal Cancer s/p colostomy s/p 3 cycles of FOLFIRI/Panitumumab was being followed by Oncology and deemed to have a poor prognosis. Her course was complicated by Recurrent Malignant Ascites for which she received palliative paracentesis last on 09/23/17 and Bilateral pleural effusions for which she had left thoracentesis on 3/25 and 3/26. Despite drainage her respiratory status worsened ( possibly due to reexpansion edema and lymphangitic carcinomatosis) and she developed Acute Hypoxic Respiratory Failure requiring intubation. S/p PEA arrest she was coded for 15 mins and family decided to make her comfortable. Pt was terminally extubated and passed at 1738.  PAST MEDICAL HISTORY :   has a past medical history of Ascites, Bronchitis, Colon cancer ([place]), Pregnancy induced hypertension, and S/P colostomy ([place]).  has a past surgical history that includes Colon surgery. Prior to Admission medications   Medication Sig Start Date End Date Taking? Authorizing Provider  ELIQUIS 5 MG TABS tablet Take 5 mg by mouth 2 (two) times daily. 08/06/17  Yes [provider]  fentaNYL (DURAGESIC - DOSED MCG/HR) 75 MCG/HR Place 2 patches (150 mcg total) onto the skin every 3 (three) days. 09/09/17  Yes Bonnielee Haff, MD  fluconazole (DIFLUCAN) 100 MG tablet Take 1 tablet (100 mg total) by mouth daily. 09/09/17  Yes Bonnielee Haff, MD  metoCLOPramide (REGLAN) 5 MG tablet Take 1 tablet (5 mg total) by mouth 4 (four) times daily -  before meals and at bedtime. 09/09/17  Yes Bonnielee Haff, MD  metoprolol tartrate (LOPRESSOR) 25 MG tablet Take 0.5 tablets (12.5 mg total) by mouth 2 (two) times daily. 08/23/17 10-03-17 Yes Elodia Florence., MD  Oxycodone HCl 10 MG TABS Take 1-2 tablets  (10-20 mg total) by mouth every 4 (four) hours as needed. 09/15/17  Yes Owens Shark, NP  potassium chloride (K-DUR) 10 MEQ tablet Take 10 mEq by mouth daily.   Yes [provider]  promethazine (PHENERGAN) 12.5 MG tablet Take 1 tablet (12.5 mg total) by mouth every 6 (six) hours as needed for nausea or vomiting. 08/23/17  Yes Elodia Florence., MD  haloperidol (HALDOL) 0.5 MG tablet Take 0.5 mg by mouth 2 (two) times daily as needed. 07/20/17   [provider]  lactulose (CHRONULAC) 10 GM/15ML solution Take 30 mLs (20 g total) by mouth 3 (three) times daily. Patient not taking: Reported on 2017-10-03 09/09/17   Bonnielee Haff, MD  ondansetron (ZOFRAN) 4 MG tablet Take 1 tablet (4 mg total) by mouth 3 (three) times daily before meals. Take before meals for 5 days and then as needed for nausea Patient not taking: Reported on 10-03-17 09/09/17   Bonnielee Haff, MD  polyethylene glycol [facility] / Floria Raveling) packet Take 17 g by mouth 2 (two) times daily. Patient not taking: Reported on October 03, 2017 09/09/17   Bonnielee Haff, MD  senna (SENOKOT) 8.6 MG TABS tablet Take 2 tablets (17.2 mg total) by mouth 2 (two) times daily. Patient not taking: Reported on 2017/10/03 09/09/17   Bonnielee Haff, MD   No Known Allergies  FAMILY HISTORY:  family history is not on file. SOCIAL HISTORY:  reports that she has never  smoked. She has never used smokeless tobacco. She reports that she does not drink alcohol or use drugs.  REVIEW OF SYSTEMS:   Constitutional: Negative for fever, chills, weight loss, malaise/fatigue and diaphoresis.  HENT: Negative for hearing loss, ear pain, nosebleeds, congestion, sore throat, neck pain, tinnitus and ear discharge.   Eyes: Negative for blurred vision, double vision, photophobia, pain, discharge and redness.  Respiratory: Negative for cough, hemoptysis, sputum production, shortness of breath, wheezing and stridor.   Cardiovascular: Negative for chest pain,  palpitations, orthopnea, claudication, leg swelling and PND.  Gastrointestinal: Negative for heartburn, nausea, vomiting, abdominal pain, diarrhea, constipation, blood in stool and melena.  Genitourinary: Negative for dysuria, urgency, frequency, hematuria and flank pain.  Musculoskeletal: Negative for myalgias, back pain, joint pain and falls.  Skin: Negative for itching and rash.  Neurological: Negative for dizziness, tingling, tremors, sensory change, speech change, focal weakness, seizures, loss of consciousness, weakness and headaches.  Endo/Heme/Allergies: Negative for environmental allergies and polydipsia. Does not bruise/bleed easily.  SUBJECTIVE:   VITAL SIGNS: Temp:  [97.4 F (36.3 C)-98.3 F (36.8 C)] 97.4 F (36.3 C) (03/27 1418) Pulse Rate:  [138-147] 147 (03/27 1418) Resp:  [18-22] 18 (03/27 1418) BP: (88-123)/(62-89) 88/62 (03/27 1418) SpO2:  [74 %-97 %] 85 % (03/27 1418) FiO2 (%):  [50 %-100 %] 60 % (03/27 1418)  PHYSICAL EXAMINATION: General:  deceased Neuro:  No neurological findings, no reflexes, and no response to pain HEENT:  Normocephalic atraumatic Cardiovascular:  No S1 or S2 no pulse Lungs:  No breath sounds Abdomen:  Distended abdomen no BS Musculoskeletal:  No reflexes,  Skin:  Grossly intact  Recent Labs  Lab 09/26/17 0538 09/27/17 0546 10-25-17 0718  NA 131* 133* 130*  K 3.4* 3.1* 4.4  CL 100* 100* 100*  CO2 22 23 18*  BUN 6 5* 6  CREATININE 0.49 0.47 0.53  GLUCOSE 91 105* 123*   Recent Labs  Lab 09/26/17 0538 09/27/17 0546 25-Oct-2017 0407  HGB 6.3* 9.6* 10.1*  HCT 20.2* 30.3* 32.6*  WBC 5.1 7.1 8.1  PLT 234 243 290    Diagnoses  Preliminary cause of death:  Secondary Diagnoses (including complications and co-morbidities):  Principal Problem:   Abdominal pain Active Problems:   Malignant ascites   Abdominal distention   Malnutrition of moderate degree   Rectal cancer (HCC)   Endotracheally intubated   Nausea & vomiting    Sinus tachycardia   Encounter for palliative care   Cardiac arrest ([place])   Acute respiratory failure with hypoxia (HCC)   Chronic bilateral pleural effusions    Pertinent Labs and Studies  Significant Diagnostic Studies Dg Chest 1 View  Result Date: 09/27/2017 CLINICAL DATA:  Post left thoracentesis EXAM: CHEST  1 VIEW COMPARISON:  Chest x-ray of 09/26/2017 FINDINGS: Much of the left pleural effusion has been evacuated after left thoracentesis. No left pneumothorax is seen. There is however small to moderate size right pleural effusion present. Right-sided Port-A-Cath tip lies near the expected SVC-RA junction and heart size is stable. IMPRESSION: 1. Evacuation of much of the left pleural effusion by thoracentesis. No pneumothorax on the left. 2. Right pleural effusion. Electronically Signed   By: Ivar Drape M.D.   On: 09/27/2017 12:40   Dg Chest 1 View  Result Date: 09/26/2017 CLINICAL DATA:  Status post right thoracentesis. EXAM: CHEST  1 VIEW COMPARISON:  Radiograph of September 24, 2017. FINDINGS: The heart size and mediastinal contours are within normal limits. Right lung is clear. Right  internal jugular Port-A-Cath is unchanged in position. Stable moderate left pleural effusion is noted which most likely is loculated. Probable underlying atelectasis or infiltrate is noted. The visualized skeletal structures are unremarkable. IMPRESSION: Stable moderate probable loculated left pleural effusion with underlying atelectasis or infiltrate. Electronically Signed   By: Marijo Conception, M.D.   On: 09/26/2017 15:45   Ct Chest W Contrast  Result Date: 09/26/2017 CLINICAL DATA:  Colon cancer diagnosed in 2019. Chemotherapy ongoing. Chest pain with shortness of breath. EXAM: CT CHEST WITH CONTRAST TECHNIQUE: Multidetector CT imaging of the chest was performed during intravenous contrast administration. CONTRAST:  52mL ISOVUE-300 IOPAMIDOL (ISOVUE-300) INJECTION 61% COMPARISON:  Portable chest 09/24/2017.  Chest CT 07/06/2017. Outside abdominal CT 07/28/2017 and additional abdominal CT 09/01/2017. FINDINGS: Cardiovascular: Study was not performed as a dedicated CTA, and there is suboptimal opacification of the pulmonary arteries. However, there are several ill-defined filling defects within the peripheral pulmonary arteries bilaterally, at least moderately suspicious for pulmonary embolism, possibly subacute in age. The central pulmonary arteries are patent. No systemic arterial abnormalities are identified. There is a partial filling defect along the posterior wall of the left brachiocephalic vein (best seen on the coronal and sagittal images), suspicious for adherent thrombus. There is possible thrombus within the right external jugular vein and the SVC, the latter surrounding the Port-A-Cath tip. No definite venous occlusion, although limited opacification of the subclavian veins. Right IJ Port-A-Cath extends to the superior cavoatrial junction. The heart size is normal. There is no pericardial effusion. Mediastinum/Nodes: Evaluation limited by extensive soft tissue edema. No enlarged mediastinal, hilar or axillary lymph nodes are seen. The thyroid gland, trachea and esophagus demonstrate no significant findings. Lungs/Pleura: There are new large dependent pleural effusions bilaterally (new from prior chest CT and enlarged from more recent abdominal CT). There is resulting dependent atelectasis in both lungs. In addition, there is asymmetric patchy airspace disease which is greatest within the right upper and lower lobes. No focal pulmonary nodule or endobronchial lesion seen. Upper abdomen: Multifocal hepatic metastatic disease is similar in size to the most recent study. Largest lesion posteriorly in the right lobe measures 5.0 x 4.5 cm on image 92/2 (previously 5.1 x 4.1 cm). There are new thin calcifications along the right hemidiaphragm. Extensive peritoneal tumor in the left upper quadrant is lower in density  and less well-defined, likely partially treated. There is a moderate amount of ascites. There is slightly increased scalloping of the liver and spleen, attributed to peritoneal disease. There is increased soft tissue edema throughout subcutaneous fat. Musculoskeletal/Chest wall: Anasarca with increased generalized soft tissue edema. No osseous metastases identified. IMPRESSION: 1. Anasarca with increasing generalized soft tissue edema, enlarging bilateral pleural effusions and ascites. 2. At least moderate suspicion of bilateral subsegmental pulmonary emboli, possibly subacute in age. Nonocclusive/adherent thrombus along the posterior wall of the left brachiocephalic vein with possible additional nonocclusive thrombi in the right internal jugular vein and SVC. Limited opacification of the subclavian veins. No signs of right heart strain. Given the patient's upper extremity and neck swelling, consider upper extremity venous doppler evaluation. 3. Similar appearance of treated multifocal hepatic metastatic disease. Peritoneal tumor appears less obvious, likely partly treated. 4. Critical Value/emergent results were called by telephone at the time of interpretation on 09/26/2017 at 9:30 am to Dr. Betsy Coder , who verbally acknowledged these results. Electronically Signed   By: Richardean Sale M.D.   On: 09/26/2017 09:41   Ct Abdomen Pelvis W Contrast  Result Date: 09/01/2017 CLINICAL  DATA:  Patient with abdominal pain and distention. History of colon cancer. EXAM: CT ABDOMEN AND PELVIS WITH CONTRAST TECHNIQUE: Multidetector CT imaging of the abdomen and pelvis was performed using the standard protocol following bolus administration of intravenous contrast. CONTRAST:  176mL ISOVUE-300 IOPAMIDOL (ISOVUE-300) INJECTION 61% COMPARISON:  CT abdomen pelvis 08/11/2017 FINDINGS: Lower chest: Normal heart size. Small pericardial effusion. Moderate layering bilateral pleural effusions. Dependent atelectasis within the  bilateral lower lobes. Hepatobiliary: Multiple low-attenuation lesions are demonstrated within the liver compatible with hepatic metastatic disease. Interval increase in size of right hepatic lobe metastatic lesion measuring 4.1 x 5.1 cm (image 20; series 2), previously 4.7 x 3.7 cm. Interval increase in size of a patent dome lesion measuring 3.5 x 3.4 cm (image 18; series 2), previously 3.2 x 3.1 cm. Interval increase in size of left hepatic lobe lesion measuring 1.2 cm (image 29; series 2), previously 1.0 cm. Gallbladder is unremarkable. Pancreas: Unremarkable Spleen: Unremarkable Adrenals/Urinary Tract: Normal adrenal glands. Kidneys enhance symmetrically with contrast. Urinary bladder is unremarkable. Stomach/Bowel: Re demonstrated necrotic rectal mass. Re demonstrated surrounding dystrophic calcification, potentially reflective of post treatment change. Re demonstrated tract extending towards the perineum/vagina. Centralization of small bowel loops. Normal morphology of the stomach. No evidence for small bowel obstruction. Left lower quadrant colostomy demonstrated. Vascular/Lymphatic: Normal caliber abdominal aorta. No retroperitoneal lymphadenopathy. Filling defect within the left femoral vein compatible with thrombosis (image 93; series 2). Reproductive: Uterus and adnexal structures are unremarkable. Other: Re demonstrated massive ascites. Interval increase in extensive omental caking and peritoneal metastatic disease. Bulky mass within the left upper quadrant measures 13 x 8 cm (image 21; series 2), previously 13 x 4.7 cm. Increased peritoneal metastatic disease throughout the right hemiabdomen. Re demonstrated metastatic disease about the colostomy within the left lower abdomen (image 60; series 2). Musculoskeletal: No aggressive or acute appearing osseous lesions. Soft tissue anasarca. IMPRESSION: 1. Re demonstrated partially necrotic large rectal mass. 2. Interval increase in extensive omental and  peritoneal metastatic disease with large volume ascites. 3. Interval increase in size of hepatic metastatic disease. 4. New moderate layering bilateral pleural effusions. 5. Extensive DVT within the left femoral vein. Electronically Signed   By: Lovey Newcomer M.D.   On: 09/01/2017 16:01   US Paracentesis  Result Date: 09/23/2017 INDICATION: Patient with history of metastatic rectal cancer with recurrent malignant ascites. Request made for diagnostic and therapeutic paracentesis. EXAM: ULTRASOUND GUIDED DIAGNOSTIC AND THERAPEUTIC PARACENTESIS MEDICATIONS: None COMPLICATIONS: None immediate. PROCEDURE: Informed written consent was obtained from the patient after a discussion of the risks, benefits and alternatives to treatment. A timeout was performed prior to the initiation of the procedure. Initial ultrasound scanning demonstrates a moderate amount of ascites within the right lower abdominal quadrant. The right lower abdomen was prepped and draped in the usual sterile fashion. 1% lidocaine was used for local anesthesia. Following this, a 19 gauge, 7-cm, Yueh catheter was introduced. An ultrasound image was saved for documentation purposes. The paracentesis was performed. The catheter was removed and a dressing was applied. The patient tolerated the procedure well without immediate post procedural complication. FINDINGS: A total of approximately 3.1 liters of bloody fluid was removed. Samples were sent to the laboratory as requested by the clinical team. IMPRESSION: Successful ultrasound-guided diagnostic and therapeutic paracentesis yielding 3.1 liters of peritoneal fluid. Read by: Rowe Robert, PA-C Electronically Signed   By: Jacqulynn Cadet M.D.   On: 09/23/2017 13:34   US Paracentesis  Result Date: 09/16/2017 INDICATION: Metastatic rectal cancer, recurrent  malignant ascites. Request made for therapeutic paracentesis. EXAM: ULTRASOUND GUIDED THERAPEUTIC PARACENTESIS MEDICATIONS: None COMPLICATIONS: None  immediate. PROCEDURE: Informed written consent was obtained from the patient after a discussion of the risks, benefits and alternatives to treatment. A timeout was performed prior to the initiation of the procedure. Initial ultrasound scanning demonstrates a moderate amount of ascites within the right lower abdominal quadrant. The right lower abdomen was prepped and draped in the usual sterile fashion. 1% lidocaine was used for local anesthesia. Following this, a Yueh catheter was introduced. An ultrasound image was saved for documentation purposes. The paracentesis was performed. The catheter was removed and a dressing was applied. The patient tolerated the procedure well without immediate post procedural complication. FINDINGS: A total of approximately 3.1 liters of bloody fluid was removed. IMPRESSION: Successful ultrasound-guided therapeutic paracentesis yielding 3.1 liters of peritoneal fluid. Read by: Rowe Robert, PA-C Electronically Signed   By: Lucrezia Europe M.D.   On: 09/16/2017 11:49   US Paracentesis  Result Date: 09/09/2017 INDICATION: Metastatic rectal carcinoma with recurrent malignant ascites. Request made for therapeutic paracentesis. EXAM: ULTRASOUND GUIDED THERAPEUTIC PARACENTESIS MEDICATIONS: None COMPLICATIONS: None immediate. PROCEDURE: Informed written consent was obtained from the patient after a discussion of the risks, benefits and alternatives to treatment. A timeout was performed prior to the initiation of the procedure. Initial ultrasound scanning demonstrates a moderate to large amount of ascites within the right mid to lower abdominal quadrant. The right mid to lower abdomen was prepped and draped in the usual sterile fashion. 1% lidocaine was used for local anesthesia. Following this, a 6 Fr Safe-T-Centesis catheter was introduced. An ultrasound image was saved for documentation purposes. The paracentesis was performed. The catheter was removed and a dressing was applied. The patient  tolerated the procedure well without immediate post procedural complication. FINDINGS: A total of approximately 3.3 liters of bloody fluid was removed. IMPRESSION: Successful ultrasound-guided therapeutic paracentesis yielding 3.3 liters of peritoneal fluid. Read by: Rowe Robert, PA-C Electronically Signed   By: Aletta Edouard M.D.   On: 09/09/2017 12:50   US Paracentesis  Result Date: 09/04/2017 INDICATION: Patient with history of metastatic rectal carcinoma with recurrent malignant ascites. Request made for therapeutic paracentesis. EXAM: ULTRASOUND GUIDED THERAPEUTIC PARACENTESIS MEDICATIONS: None COMPLICATIONS: None immediate. PROCEDURE: Informed written consent was obtained from the patient after a discussion of the risks, benefits and alternatives to treatment. A timeout was performed prior to the initiation of the procedure. Initial ultrasound scanning demonstrates a moderate to large amount of ascites within the right lower abdominal quadrant. The right lower abdomen was prepped and draped in the usual sterile fashion. 1% lidocaine was used for local anesthesia. Following this, a Yueh catheter was introduced. An ultrasound image was saved for documentation purposes. The paracentesis was performed. The catheter was removed and a dressing was applied. The patient tolerated the procedure well without immediate post procedural complication. FINDINGS: A total of approximately 3.2 liters of bloody fluid was removed. IMPRESSION: Successful ultrasound-guided therapeutic paracentesis yielding 3.2 liters of peritoneal fluid. Read by: Rowe Robert, PA-C Electronically Signed   By: Sandi Mariscal M.D.   On: 09/04/2017 11:59   US Paracentesis  Result Date: 08/30/2017 INDICATION: Patient with history of metastatic rectal carcinoma with recurrent malignant ascites. Request made for therapeutic paracentesis. EXAM: ULTRASOUND GUIDED THERAPEUTIC PARACENTESIS MEDICATIONS: None. COMPLICATIONS: None immediate. PROCEDURE:  Informed written consent was obtained from the patient after a discussion of the risks, benefits and alternatives to treatment. A timeout was performed prior to the initiation  of the procedure. Initial ultrasound scanning demonstrates a moderate amount of ascites within the left mid to lower abdominal quadrant. The left mid to lower abdomen was prepped and draped in the usual sterile fashion. 1% lidocaine was used for local anesthesia. Following this, a Yueh catheter was introduced. An ultrasound image was saved for documentation purposes. The paracentesis was performed. The catheter was removed and a dressing was applied. The patient tolerated the procedure well without immediate post procedural complication. FINDINGS: A total of approximately 3 liters of bloody fluid was removed. IMPRESSION: Successful ultrasound-guided therapeutic paracentesis yielding 3 liters of peritoneal fluid. Read by: Rowe Robert, PA-C Electronically Signed   By: Sandi Mariscal M.D.   On: 08/30/2017 15:31   Dg Chest Port 1 View  Result Date: October 15, 2017 CLINICAL DATA:  Hypoxia EXAM: PORTABLE CHEST 1 VIEW COMPARISON:  Yesterday FINDINGS: Bilateral interstitial and airspace opacity especially increased on the left since prior. Bilateral pleural effusion with signs of loculation, increased on the left. Differential layering may account for some of this change in pleural fluid, given the short interval. Normal heart size. Porta catheter on the right with tip at the upper cavoatrial junction. IMPRESSION: 1. Increased bilateral lung opacity with symmetry favoring edema. 2. Bilateral pleural effusion with possible loculation, progressed on the left. Electronically Signed   By: Monte Fantasia M.D.   On: 10-15-17 10:49   Dg Chest Port 1 View  Result Date: 09/24/2017 CLINICAL DATA:  Chest discomfort and known history of lung carcinoma EXAM: PORTABLE CHEST 1 VIEW COMPARISON:  08/31/2017 FINDINGS: Cardiac shadow is stable. Right chest wall port  is seen. Some patchy atelectatic changes are noted in the right base. Previously seen right pleural effusion has decreased in the interval from the prior exam. There has however been interval development of a moderate left pleural effusion with underlying atelectasis/infiltrate. IMPRESSION: Increasing left effusion and basilar changes as described. Electronically Signed   By: Inez Catalina M.D.   On: 09/24/2017 20:17   Dg Chest Port 1 View  Result Date: 08/31/2017 CLINICAL DATA:  Chest pain.  History of colonic malignancy. EXAM: PORTABLE CHEST 1 VIEW COMPARISON:  Portable chest x-ray of August 26, 2017 FINDINGS: The lungs are less well inflated today. There is a smaller moderate-sized right pleural effusion. There is no alveolar infiltrate. There is no pneumothorax. The heart is top-normal in size. The pulmonary vascularity is not engorged. The power port catheter tip projects over the distal third of the SVC. The bowel gas pattern in the upper abdomen is unremarkable. The bony thorax exhibits no acute abnormality. IMPRESSION: Smaller moderate-sized right pleural effusion more conspicuous than on the earlier study. Bilateral hypoinflation. Electronically Signed   By: David  Martinique M.D.   On: 08/31/2017 11:03   Dg Abd Portable 1v  Result Date: 09/24/2017 CLINICAL DATA:  Distended abdomen, history of colon carcinoma EXAM: PORTABLE ABDOMEN - 1 VIEW COMPARISON:  09/01/2017 FINDINGS: Changes consistent with left-sided ostomy. Scattered bowel gas is noted without obstructive change. Ingested pill is noted within the distal stomach. No acute bony abnormality is noted. IMPRESSION: Nonspecific abdomen. Electronically Signed   By: Inez Catalina M.D.   On: 09/24/2017 20:16   US Thoracentesis Asp Pleural Space W/img Guide  Result Date: 09/27/2017 INDICATION: Patient with history of metastatic rectal cancer, bilateral pleural effusions, status post right thoracentesis on 09/26/17; now presents for diagnostic and  therapeutic left thoracentesis. EXAM: ULTRASOUND GUIDED DIAGNOSTIC AND THERAPEUTIC LEFT THORACENTESIS MEDICATIONS: None COMPLICATIONS: None immediate. PROCEDURE: An ultrasound guided  thoracentesis was thoroughly discussed with the patient and questions answered. The benefits, risks, alternatives and complications were also discussed. The patient understands and wishes to proceed with the procedure. Written consent was obtained. Ultrasound was performed to localize and mark an adequate pocket of fluid in the left chest. The area was then prepped and draped in the normal sterile fashion. 1% Lidocaine was used for local anesthesia. Under ultrasound guidance a 6 Fr Safe-T-Centesis catheter was introduced. Thoracentesis was performed. The catheter was removed and a dressing applied. FINDINGS: A total of approximately 1.3 liters of bloody fluid was removed. Samples were sent to the laboratory as requested by the clinical team. IMPRESSION: Successful ultrasound guided diagnostic and therapeutic left thoracentesis yielding 1.3 liters of pleural fluid. Read by: Rowe Robert, PA-C Electronically Signed   By: Corrie Mckusick D.O.   On: 09/27/2017 12:14   US Thoracentesis Asp Pleural Space W/img Guide  Result Date: 09/26/2017 INDICATION: Metastatic rectal cancer, dyspnea, bilateral pleural effusions; request made for diagnostic and therapeutic right thoracentesis. EXAM: ULTRASOUND GUIDED DIAGNOSTIC AND THERAPEUTIC RIGHT THORACENTESIS MEDICATIONS: None COMPLICATIONS: None immediate. PROCEDURE: An ultrasound guided thoracentesis was thoroughly discussed with the patient and questions answered. The benefits, risks, alternatives and complications were also discussed. The patient understands and wishes to proceed with the procedure. Written consent was obtained. Ultrasound was performed to localize and mark an adequate pocket of fluid in the right chest. The area was then prepped and draped in the normal sterile fashion. 1%  Lidocaine was used for local anesthesia. Under ultrasound guidance a 6 Fr Safe-T-Centesis catheter was introduced. Thoracentesis was performed. The catheter was removed and a dressing applied. FINDINGS: A total of approximately 1.1 liters of bloody fluid was removed. Samples were sent to the laboratory as requested by the clinical team. IMPRESSION: Successful ultrasound guided diagnostic and therapeutic right thoracentesis yielding 1.1 liters of pleural fluid. Read by: Rowe Robert, PA-C Electronically Signed   By: Marybelle Killings M.D.   On: 09/26/2017 15:24    Microbiology Recent Results (from the past 240 hour(s))  Body fluid culture     Status: None   Collection Time: 09/23/17 12:33 PM  Result Value Ref Range Status   Specimen Description   Final    PERITONEAL Performed at [facility] [address], [place], [place] [address]    Special Requests   Final    NONE Performed at [facility], [place] [address] [place], [place] [address]    Gram Stain NO WBC SEEN NO ORGANISMS SEEN   Final   Culture   Final    NO GROWTH 3 DAYS Performed at [facility], [place] [address], [place], [place] [address]    Report Status 09/26/2017 FINAL  Final    Lab Basic Metabolic Panel: Recent Labs  Lab 09/24/17 0634 09/25/17 0630 09/26/17 0538 09/27/17 0546 10-26-17 0718  NA 131* 128* 131* 133* 130*  K 4.4 4.1 3.4* 3.1* 4.4  CL 94* 95* 100* 100* 100*  CO2 21* 20* 22 23 18*  GLUCOSE 111* 114* 91 105* 123*  BUN 8 9 6  5* 6  CREATININE 0.73 0.65 0.49 0.47 0.53  CALCIUM 8.4* 8.2* 8.4* 8.0* 8.1*   Liver Function Tests: Recent Labs  Lab 09/07/2017 1844 09/27/17 0546 October 26, 2017 0718  AST 22 23 31   ALT 12* 11* 9*  ALKPHOS 177* 148* 158*  BILITOT 0.6 0.5 0.5  PROT 6.0* 5.6* 5.1*  ALBUMIN 1.8* 2.8* 2.3*   No results for  input(s): LIPASE, AMYLASE in the last 168 hours. No results for input(s): AMMONIA in the last 168 hours. CBC: Recent Labs  Lab  09/02/2017 1844 09/24/17 0634 09/24/17 1239 09/26/17 0538 09/27/17 0546 10/22/17 0407  WBC 3.2* 14.7* 10.3 5.1 7.1 8.1  NEUTROABS 2.5 13.4* 9.2*  --   --   --   HGB 9.0* 9.9* 8.8* 6.3* 9.6* 10.1*  HCT 28.4* 32.5* 28.4* 20.2* 30.3* 32.6*  MCV 84.3 88.3 87.9 89.4 82.3 83.0  PLT 159 209 200 234 243 290   Cardiac Enzymes: No results for input(s): CKTOTAL, CKMB, CKMBINDEX, TROPONINI in the last 168 hours. Sepsis Labs: Recent Labs  Lab 09/24/17 1239 09/26/17 0538 09/27/17 0546 22-Oct-2017 0407  WBC 10.3 5.1 7.1 8.1     Signed Dr Seward Carol Pulmonary Critical [facility] Loc[facility]

## 2017-10-03 NOTE — Progress Notes (Addendum)
Paged by RN regarding worsening hypoxia at 5:54 pm by RN Gave orders for lasix ,bipap ,ABG and neb and transferred patient to step down where she got intubated  Dr Karleen Hampshire , was at bedside and assessed patient  I talked to the family multiple times today prior to this RRT and had already apprised them of impending code  Family wanted to continue her care at current status with ongoing discussions with patient to change her code status from full code to DNR In the event that patient was unable to make her own decisions, the mother was in agreement to make her a DNR

## 2017-10-03 NOTE — Progress Notes (Signed)
Per [facility] rep, pt is appropriate for home hospice. She will order equipment for home and it will be delivered by tomorrow. MD made aware. Marney Doctor RN,BSN,NCM [phone]

## 2017-10-03 NOTE — Progress Notes (Signed)
Rapid response nurse and respiratory remain at bedside with patient. Dr. Karleen Hampshire  At bedside to assess patient for Dr. Allyson Sabal. Dr. Karleen Hampshire has spoken with family and family is agreeable to transfer patient to step down to initiate bipap.

## 2017-10-03 NOTE — Progress Notes (Signed)
Pt intubated and CPR started immediately. MD, RT, CRNA and RN at bedside.

## 2017-10-03 NOTE — Consult Note (Signed)
.. ..  Name: Amber Silva MRN: [id number] DOB: [birth year]    ADMISSION DATE:  09/12/2017 CONSULTATION DATE:  2017-10-23  REFERRING MD :  Posey Pronto MD  CHIEF COMPLAINT:  S/p cardiac arrest, acute hypoxic respiratory failure, terminal cancer  BRIEF PATIENT DESCRIPTION: 27 yr old female with PMHx stage IV rectal cancer with Acute Hypoxic Respiratory Failure secondary to bilateral effusions was intubated and is now s/p cardiac arrest. Family has made pt comfort care and decided to terminally extubate  PAST MEDICAL HISTORY :   has a past medical history of Ascites, Bronchitis, Colon cancer ([place]), Pregnancy induced hypertension, and S/P colostomy ([place]).  has a past surgical history that includes Colon surgery. Prior to Admission medications   Medication Sig Start Date End Date Taking? Authorizing Provider  ELIQUIS 5 MG TABS tablet Take 5 mg by mouth 2 (two) times daily. 08/06/17  Yes [provider]  fentaNYL (DURAGESIC - DOSED MCG/HR) 75 MCG/HR Place 2 patches (150 mcg total) onto the skin every 3 (three) days. 09/09/17  Yes Bonnielee Haff, MD  fluconazole (DIFLUCAN) 100 MG tablet Take 1 tablet (100 mg total) by mouth daily. 09/09/17  Yes Bonnielee Haff, MD  metoCLOPramide (REGLAN) 5 MG tablet Take 1 tablet (5 mg total) by mouth 4 (four) times daily -  before meals and at bedtime. 09/09/17  Yes Bonnielee Haff, MD  metoprolol tartrate (LOPRESSOR) 25 MG tablet Take 0.5 tablets (12.5 mg total) by mouth 2 (two) times daily. 08/23/17 09/29/2017 Yes Elodia Florence., MD  Oxycodone HCl 10 MG TABS Take 1-2 tablets (10-20 mg total) by mouth every 4 (four) hours as needed. 09/15/17  Yes Owens Shark, NP  potassium chloride (K-DUR) 10 MEQ tablet Take 10 mEq by mouth daily.   Yes [provider]  promethazine (PHENERGAN) 12.5 MG tablet Take 1 tablet (12.5 mg total) by mouth every 6 (six) hours as needed for nausea or vomiting. 08/23/17  Yes Elodia Florence., MD  haloperidol (HALDOL) 0.5 MG  tablet Take 0.5 mg by mouth 2 (two) times daily as needed. 07/20/17   [provider]  lactulose (CHRONULAC) 10 GM/15ML solution Take 30 mLs (20 g total) by mouth 3 (three) times daily. Patient not taking: Reported on 09/07/2017 09/09/17   Bonnielee Haff, MD  ondansetron (ZOFRAN) 4 MG tablet Take 1 tablet (4 mg total) by mouth 3 (three) times daily before meals. Take before meals for 5 days and then as needed for nausea Patient not taking: Reported on 09/25/2017 09/09/17   Bonnielee Haff, MD  polyethylene glycol [facility] / Floria Raveling) packet Take 17 g by mouth 2 (two) times daily. Patient not taking: Reported on 09/15/2017 09/09/17   Bonnielee Haff, MD  senna (SENOKOT) 8.6 MG TABS tablet Take 2 tablets (17.2 mg total) by mouth 2 (two) times daily. Patient not taking: Reported on 09/18/2017 09/09/17   Bonnielee Haff, MD   No Known Allergies  FAMILY HISTORY:  family history is not on file. SOCIAL HISTORY:  reports that she has never smoked. She has never used smokeless tobacco. She reports that she does not drink alcohol or use drugs.  REVIEW OF SYSTEMS:   Constitutional: Negative for fever, chills, weight loss, malaise/fatigue and diaphoresis.  HENT: Negative for hearing loss, ear pain, nosebleeds, congestion, sore throat, neck pain, tinnitus and ear discharge.   Eyes: Negative for blurred vision, double vision, photophobia, pain, discharge and redness.  Respiratory: Negative for cough, hemoptysis, sputum production, shortness of breath, wheezing and stridor.  Cardiovascular: Negative for chest pain, palpitations, orthopnea, claudication, leg swelling and PND.  Gastrointestinal: Negative for heartburn, nausea, vomiting, abdominal pain, diarrhea, constipation, blood in stool and melena.  Genitourinary: Negative for dysuria, urgency, frequency, hematuria and flank pain.  Musculoskeletal: Negative for myalgias, back pain, joint pain and falls.  Skin: Negative for itching and rash.    Neurological: Negative for dizziness, tingling, tremors, sensory change, speech change, focal weakness, seizures, loss of consciousness, weakness and headaches.  Endo/Heme/Allergies: Negative for environmental allergies and polydipsia. Does not bruise/bleed easily.  SUBJECTIVE:   VITAL SIGNS: Temp:  [97.4 F (36.3 C)-98.3 F (36.8 C)] 97.4 F (36.3 C) (03/27 1418) Pulse Rate:  [138-147] 147 (03/27 1418) Resp:  [18-22] 18 (03/27 1418) BP: (88-123)/(62-89) 88/62 (03/27 1418) SpO2:  [74 %-97 %] 85 % (03/27 1418) FiO2 (%):  [50 %-100 %] 60 % (03/27 1418)  PHYSICAL EXAMINATION: General:  Not awake or alert Neuro:  GCS 3 no response to painful stimuli HEENT:  ETT removed. Dry mucosa Cardiovascular:  Faint pulse S1 and S2 Lungs:  Diffuse coarse breath sounds when bagged. Now extubated minimal Abdomen:  Distended no BS Musculoskeletal:  No gross deformity Skin:  Poor turgor  Recent Labs  Lab 09/26/17 0538 09/27/17 0546 10/22/2017 0718  NA 131* 133* 130*  K 3.4* 3.1* 4.4  CL 100* 100* 100*  CO2 22 23 18*  BUN 6 5* 6  CREATININE 0.49 0.47 0.53  GLUCOSE 91 105* 123*   Recent Labs  Lab 09/26/17 0538 09/27/17 0546 Oct 22, 2017 0407  HGB 6.3* 9.6* 10.1*  HCT 20.2* 30.3* 32.6*  WBC 5.1 7.1 8.1  PLT 234 243 290   Dg Chest 1 View  Result Date: 09/27/2017 CLINICAL DATA:  Post left thoracentesis EXAM: CHEST  1 VIEW COMPARISON:  Chest x-ray of 09/26/2017 FINDINGS: Much of the left pleural effusion has been evacuated after left thoracentesis. No left pneumothorax is seen. There is however small to moderate size right pleural effusion present. Right-sided Port-A-Cath tip lies near the expected SVC-RA junction and heart size is stable. IMPRESSION: 1. Evacuation of much of the left pleural effusion by thoracentesis. No pneumothorax on the left. 2. Right pleural effusion. Electronically Signed   By: Ivar Drape M.D.   On: 09/27/2017 12:40   Dg Chest Port 1 View  Result Date:  Oct 22, 2017 CLINICAL DATA:  Hypoxia EXAM: PORTABLE CHEST 1 VIEW COMPARISON:  Yesterday FINDINGS: Bilateral interstitial and airspace opacity especially increased on the left since prior. Bilateral pleural effusion with signs of loculation, increased on the left. Differential layering may account for some of this change in pleural fluid, given the short interval. Normal heart size. Porta catheter on the right with tip at the upper cavoatrial junction. IMPRESSION: 1. Increased bilateral lung opacity with symmetry favoring edema. 2. Bilateral pleural effusion with possible loculation, progressed on the left. Electronically Signed   By: Monte Fantasia M.D.   On: October 22, 2017 10:49   US Thoracentesis Asp Pleural Space W/img Guide  Result Date: 09/27/2017 INDICATION: Patient with history of metastatic rectal cancer, bilateral pleural effusions, status post right thoracentesis on 09/26/17; now presents for diagnostic and therapeutic left thoracentesis. EXAM: ULTRASOUND GUIDED DIAGNOSTIC AND THERAPEUTIC LEFT THORACENTESIS MEDICATIONS: None COMPLICATIONS: None immediate. PROCEDURE: An ultrasound guided thoracentesis was thoroughly discussed with the patient and questions answered. The benefits, risks, alternatives and complications were also discussed. The patient understands and wishes to proceed with the procedure. Written consent was obtained. Ultrasound was performed to localize and mark an adequate pocket  of fluid in the left chest. The area was then prepped and draped in the normal sterile fashion. 1% Lidocaine was used for local anesthesia. Under ultrasound guidance a 6 Fr Safe-T-Centesis catheter was introduced. Thoracentesis was performed. The catheter was removed and a dressing applied. FINDINGS: A total of approximately 1.3 liters of bloody fluid was removed. Samples were sent to the laboratory as requested by the clinical team. IMPRESSION: Successful ultrasound guided diagnostic and therapeutic left  thoracentesis yielding 1.3 liters of pleural fluid. Read by: Rowe Robert, PA-C Electronically Signed   By: Corrie Mckusick D.O.   On: 09/27/2017 12:14    ASSESSMENT / PLAN: 27 yr old female with PMHx of Stage 4 Rectal Cancer s/p colostomy and had completed 3 cycles of FOLFIRI/Panitumumab. With the following issues: 1. Cardiac arrest- Cardiogenic Hypovolemic shock 2. Acute Hypoxic Respiratory Failure secondary to bilateral pleural effusions- intubated emergently 3. Malignant Ascites s/p palliative paracentesis 4. Protein malnutrition  Patient was to be discharged to home hospice prior to her decompensation today. When she was verbal she asked to be intubated and to try everything. Given the multiple issues and poor prognosis patient's family has made the decision to make her comfort care. After discussion about current clinical status patient is now terminally extubated. Family is at bedside and Chaplain services are present for support. Patient has their own Pastoral counsel present. Care team is aware of plan.   I, Dr Seward Carol have personally reviewed patient's available data, including medical history, events of note, physical examination and test results as part of my evaluation. I have discussed with NP and other care providers such as pharmacist, RN and Warren Lacy. The patient is critically ill with multiple organ systems failure and requires high complexity decision making for assessment and support, frequent evaluation and titration of therapies, application of advanced monitoring technologies and extensive interpretation of multiple databases. Critical Care Time devoted to patient care services described in this note is 45 Minutes. This time reflects time of care of this signee Dr Seward Carol. This critical care time does not reflect procedure time, or teaching time or supervisory time of NP but could involve care discussion time     CC TIME: 45 mins PROGNOSIS: poor FAMILY:  present at bedside Palliative and Chaplain consulted and on board    Signed Dr Seward Carol Pulmonary and [place]  03-Oct-2017, 7:36 PM

## 2017-10-03 NOTE — Progress Notes (Signed)
Referral called to [facility] per MD order. Marney Doctor RN,BSN,NCM [phone]

## 2017-10-03 NOTE — Progress Notes (Signed)
Physical Therapy Discharge Patient Details Name: Amber Silva MRN: [id number] DOB: Nov 04, 1990 Today's Date: October 13, 2017 Time:  -     Patient discharged from PT services secondary to medical decline - will need to re-order PT to resume therapy services.  RN reports Sats not staying up with maobility attempts, on NRB mask. Note plans for home with Hospice.   GP     Marcelino Freestone PT [phone]  10/13/17, 2:55 PM

## 2017-10-03 NOTE — Addendum Note (Signed)
Addendum  created 2017-10-06 2000 by Anne Fu, CRNA   Charge Capture section accepted, Diagnosis association updated, Visit diagnoses modified

## 2017-10-03 DEATH — deceased

## 2017-10-20 DIAGNOSIS — Z7189 Other specified counseling: Secondary | ICD-10-CM

## 2018-07-30 IMAGING — US US PARACENTESIS
1 series · 5 of 5 positions shown · non-contrast
Comparison: none

INDICATION: Metastatic rectal carcinoma with recurrent malignant ascites.
Request made for therapeutic paracentesis.

[Series 1: us paracentesis · 0.26mm/px · 5 of 5 slices shown]
[im 1/5]
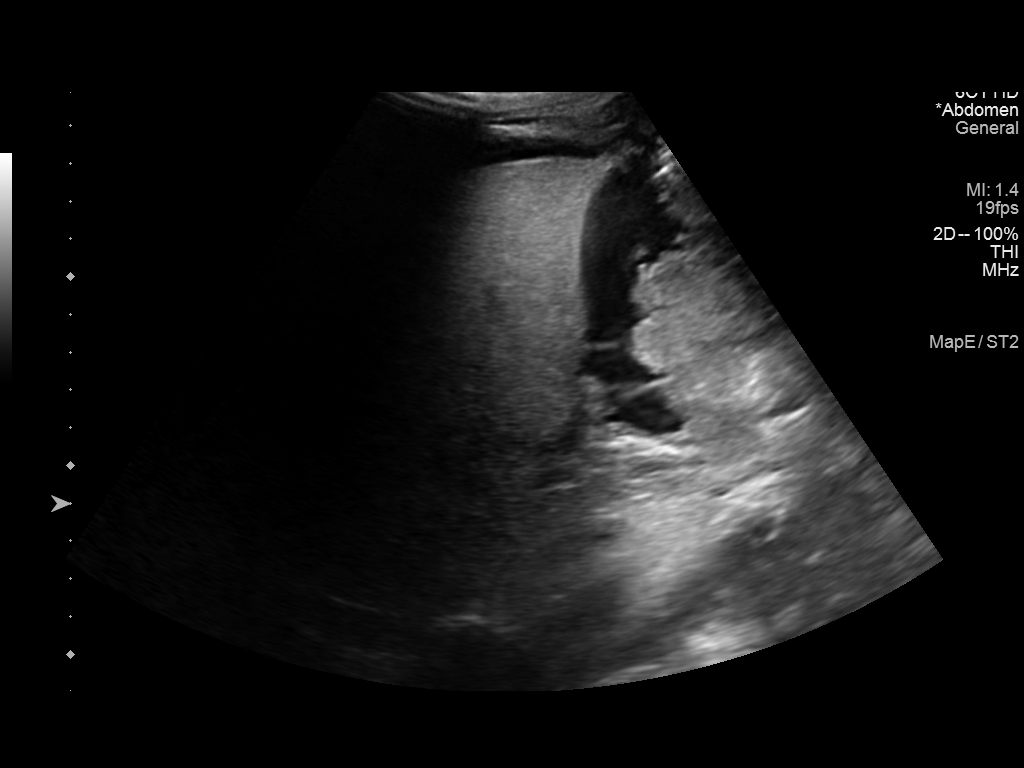
[im 2/5]
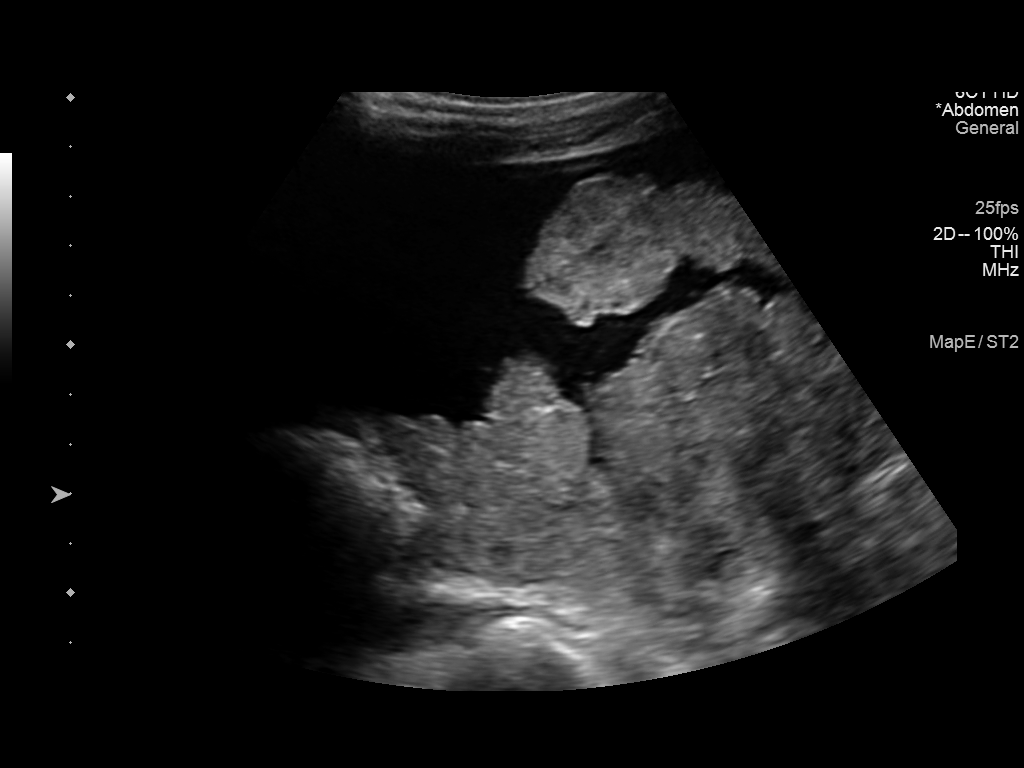
[im 3/5]
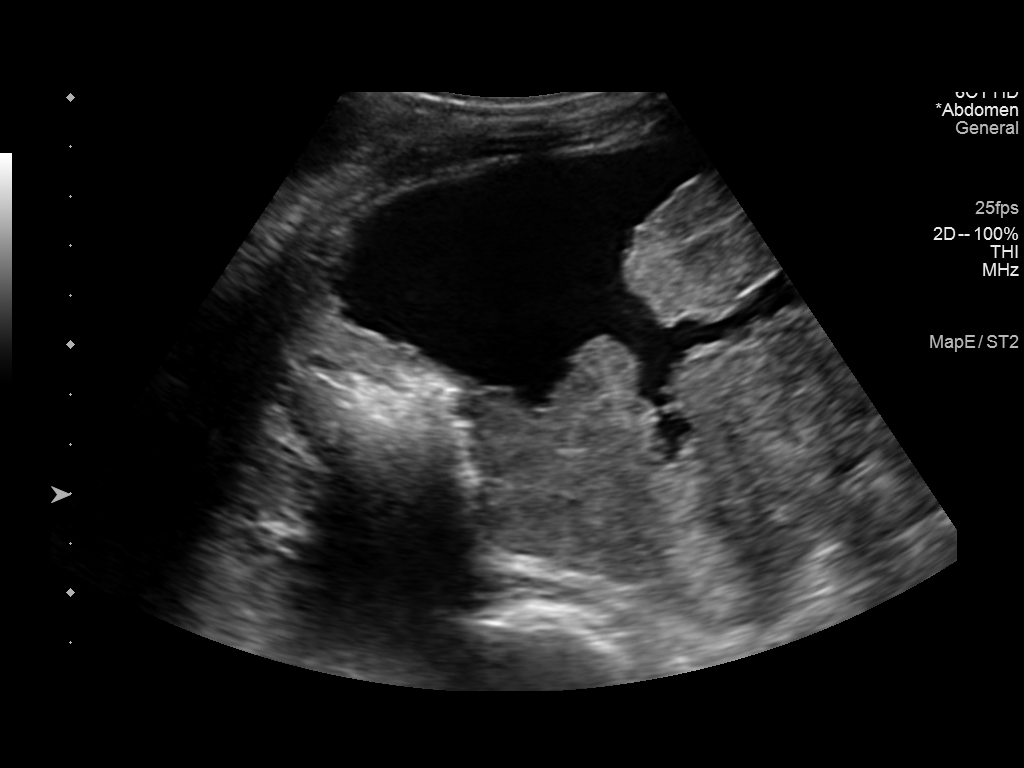
[im 4/5]
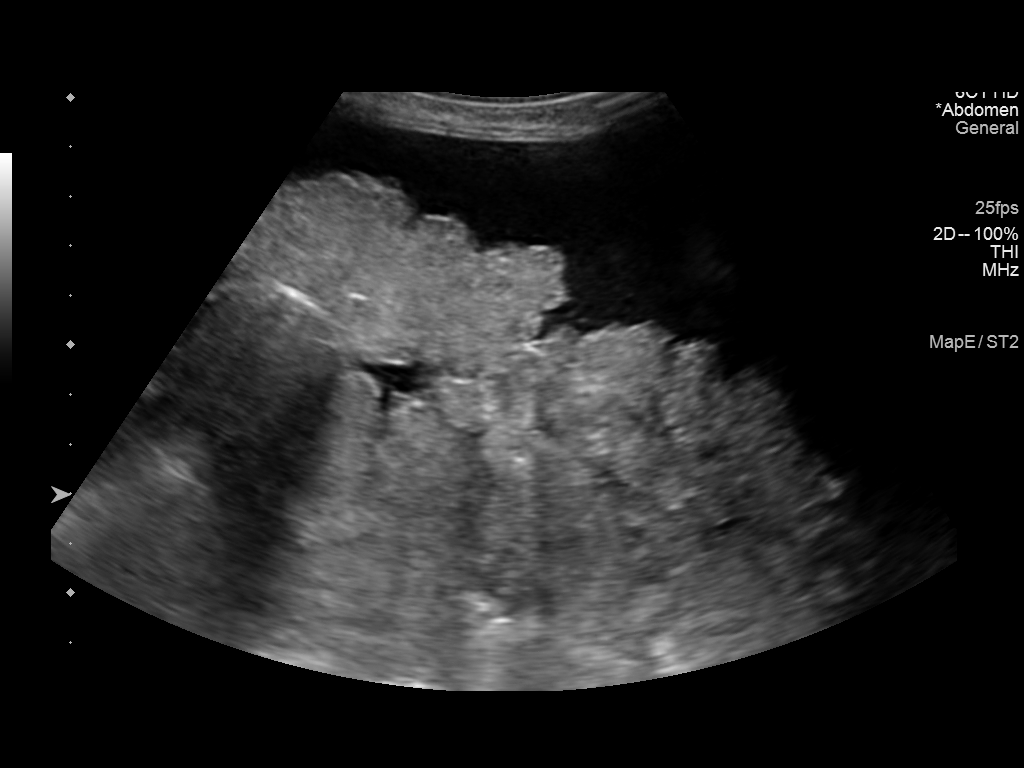
[im 5/5]
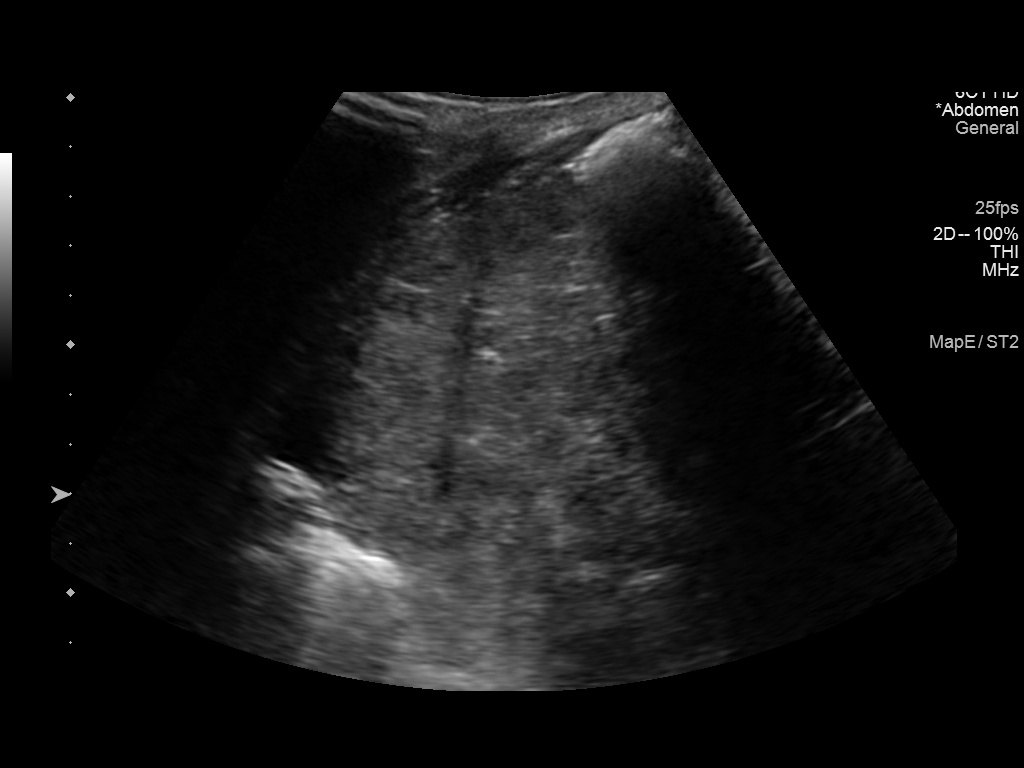

[5 of 5 positions shown; findings below may reference images not displayed]

EXAM:
ULTRASOUND GUIDED THERAPEUTIC PARACENTESIS

MEDICATIONS:
None

COMPLICATIONS:
None immediate.

PROCEDURE:
Informed written consent was obtained from the patient after a
discussion of the risks, benefits and alternatives to treatment. A
timeout was performed prior to the initiation of the procedure.

Initial ultrasound scanning demonstrates a moderate to large amount
of ascites within the right mid to lower abdominal quadrant. The
right mid to lower abdomen was prepped and draped in the usual
sterile fashion. 1% lidocaine was used for local anesthesia.

Following this, a 6 Fr Safe-T-Centesis catheter was introduced. An
ultrasound image was saved for documentation purposes. The
paracentesis was performed. The catheter was removed and a dressing
was applied. The patient tolerated the procedure well without
immediate post procedural complication.
FINDINGS: A total of approximately 3.3 liters of bloody fluid was removed.
IMPRESSION: Successful ultrasound-guided therapeutic paracentesis yielding
liters of peritoneal fluid.

## 2018-08-06 IMAGING — US US PARACENTESIS
1 series · 4 of 4 positions shown · non-contrast
Comparison: none

INDICATION: Metastatic rectal cancer, recurrent malignant ascites. Request made
for therapeutic paracentesis.

[Series 1: us paracentesis · 0.25mm/px · 4 of 4 slices shown]
[im 1/4]
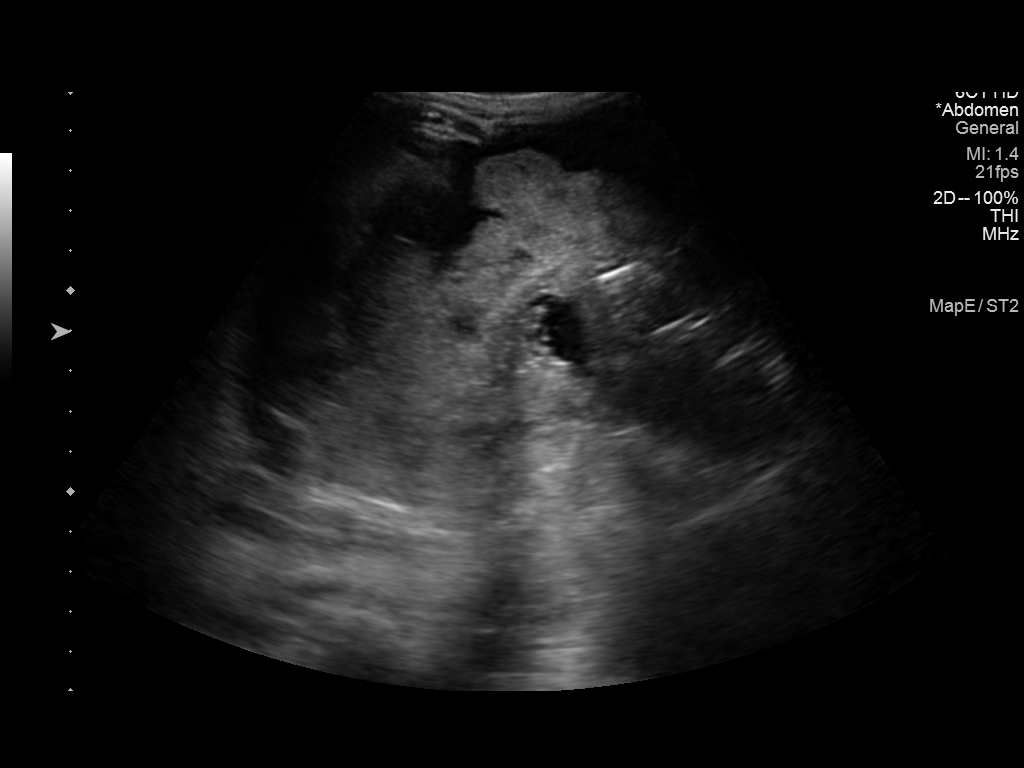
[im 2/4]
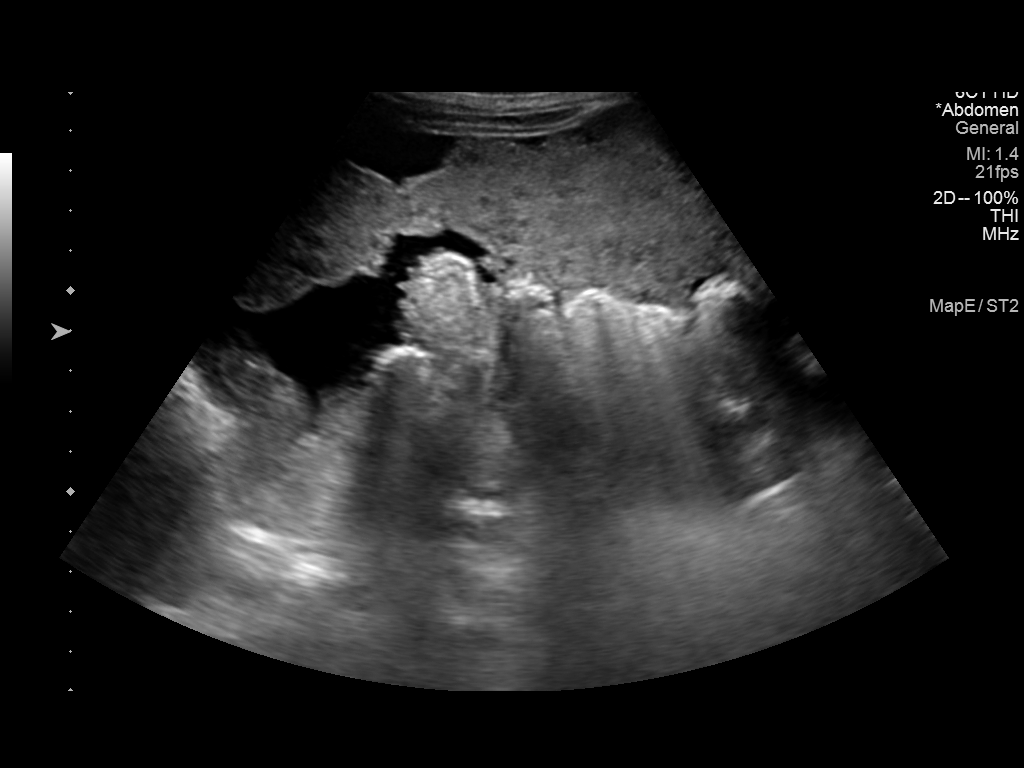
[im 3/4]
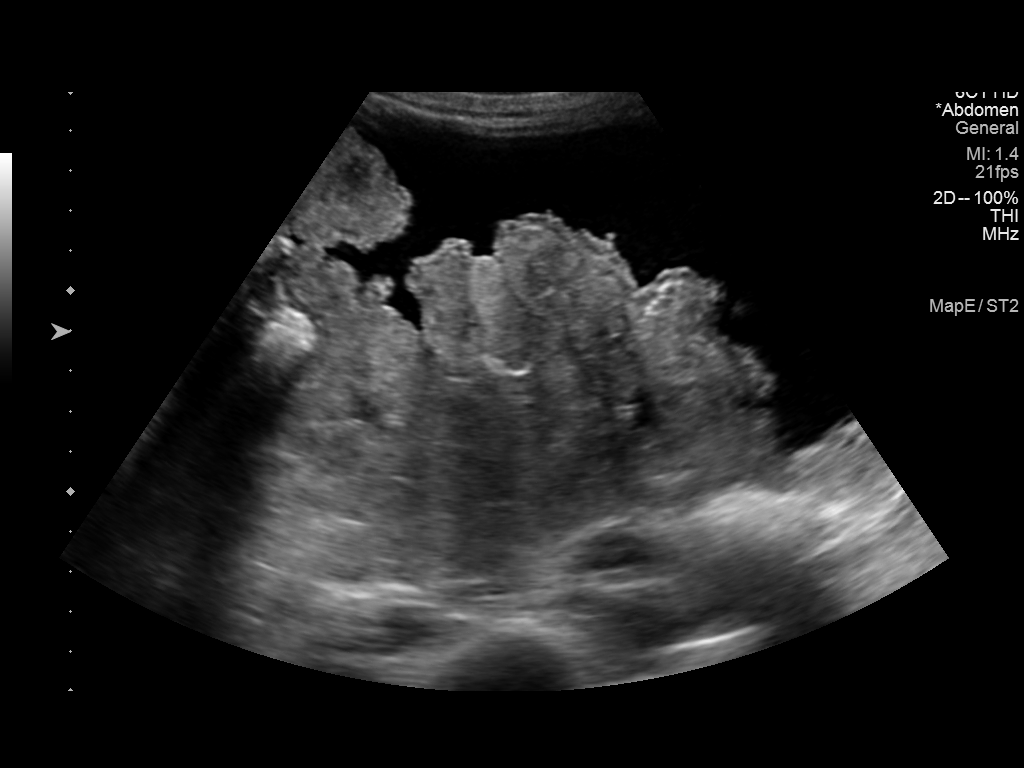
[im 4/4]
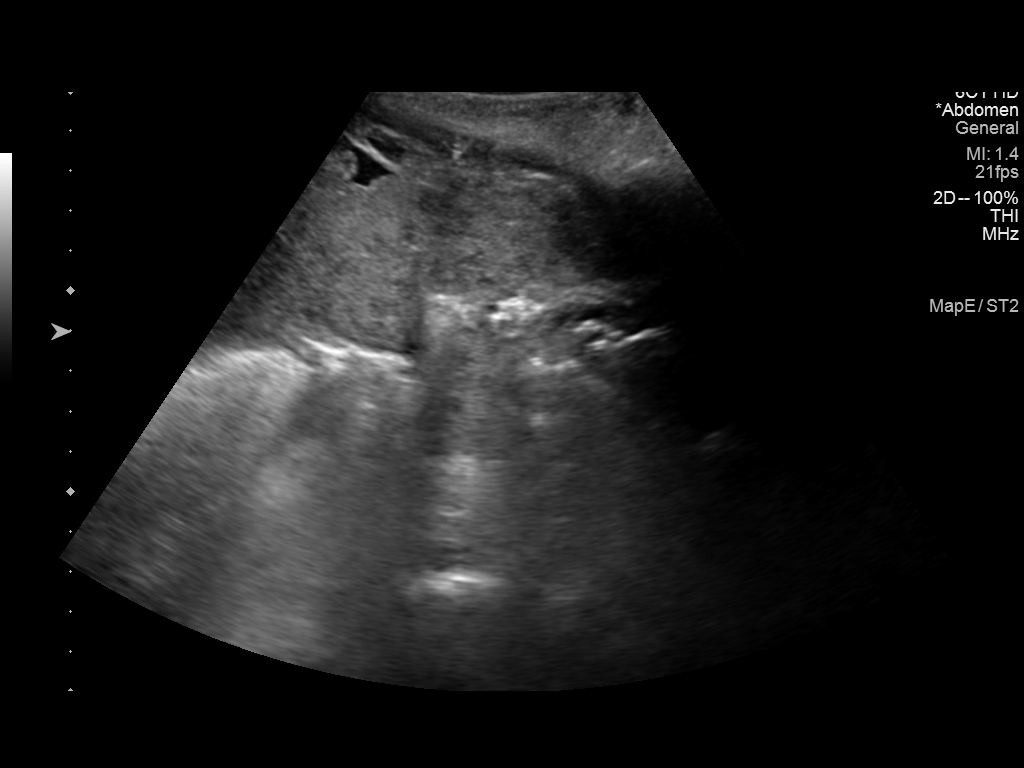

[4 of 4 positions shown; findings below may reference images not displayed]

EXAM:
ULTRASOUND GUIDED THERAPEUTIC PARACENTESIS

MEDICATIONS:
None

COMPLICATIONS:
None immediate.

PROCEDURE:
Informed written consent was obtained from the patient after a
discussion of the risks, benefits and alternatives to treatment. A
timeout was performed prior to the initiation of the procedure.

Initial ultrasound scanning demonstrates a moderate amount of
ascites within the right lower abdominal quadrant. The right lower
abdomen was prepped and draped in the usual sterile fashion. 1%
lidocaine was used for local anesthesia.

Following this, a Yueh catheter was introduced. An ultrasound image
was saved for documentation purposes. The paracentesis was
performed. The catheter was removed and a dressing was applied. The
patient tolerated the procedure well without immediate post
procedural complication.
FINDINGS: A total of approximately 3.1 liters of bloody fluid was removed.
IMPRESSION: Successful ultrasound-guided therapeutic paracentesis yielding
liters of peritoneal fluid.

## 2018-08-16 IMAGING — CT CT CHEST W/ CM
2 of 4 series · 13 of 36 positions shown, 16 images · IV contrast (ISOVUE 300)
Comparison: Portable chest 09/24/2017.

CLINICAL DATA: Colon cancer diagnosed in 9678. Chemotherapy
ongoing. Chest pain with shortness of breath.

EXAM:
CT CHEST WITH CONTRAST
TECHNIQUE: Multidetector CT imaging of the chest was performed during
intravenous contrast administration.
CONTRAST:  75mL WTSOCO-7HH IOPAMIDOL (WTSOCO-7HH) INJECTION 61%

[Series 2: axial st · axial · 0.73mm/px · z∈[+1120,+1354]mm · 10 of 135 slices shown, 13 images]
[im 9/135  mediastinal]
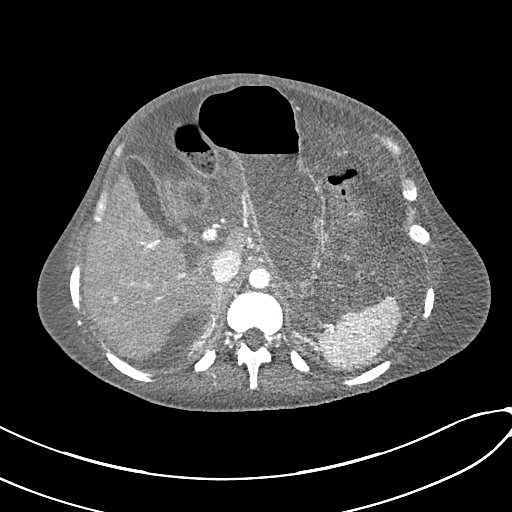
[im 9/135  lung]
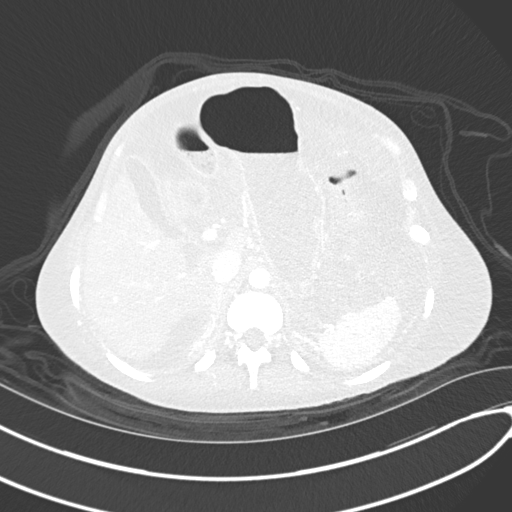
[im 27/135  lung]
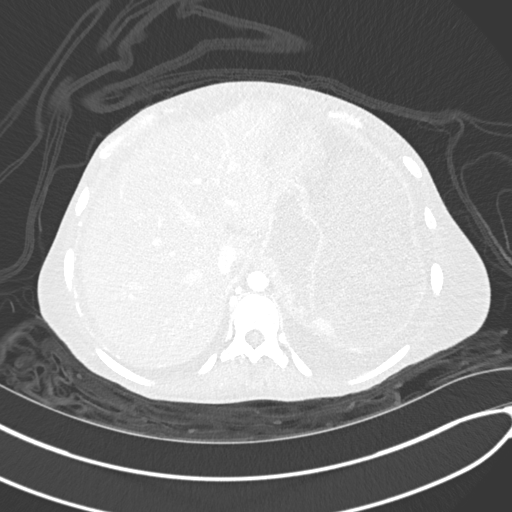
[im 36/135  lung]
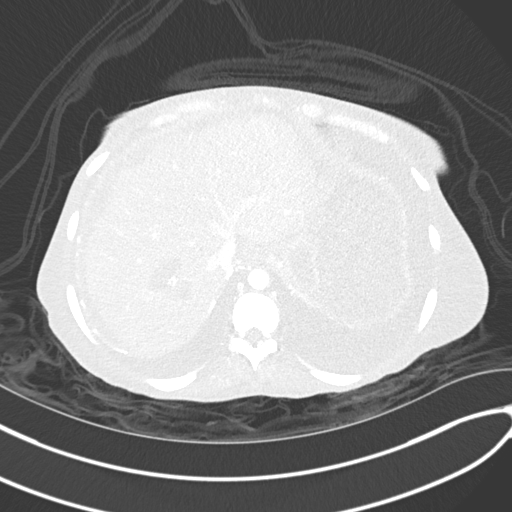
[im 45/135  lung]
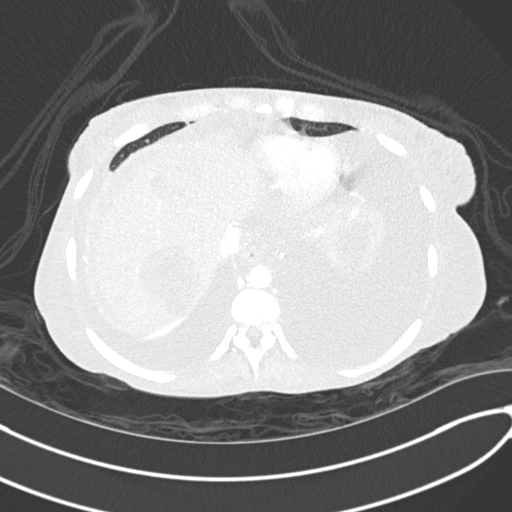
[im 63/135  mediastinal]
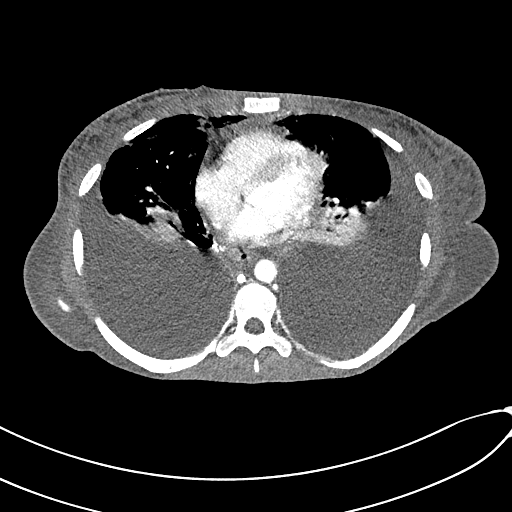
[im 63/135  lung]
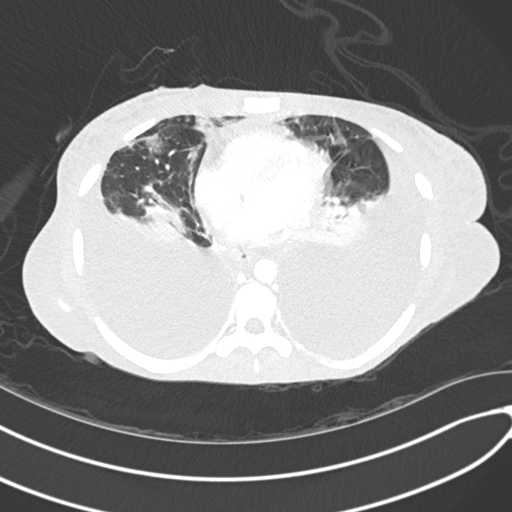
[im 72/135  lung]
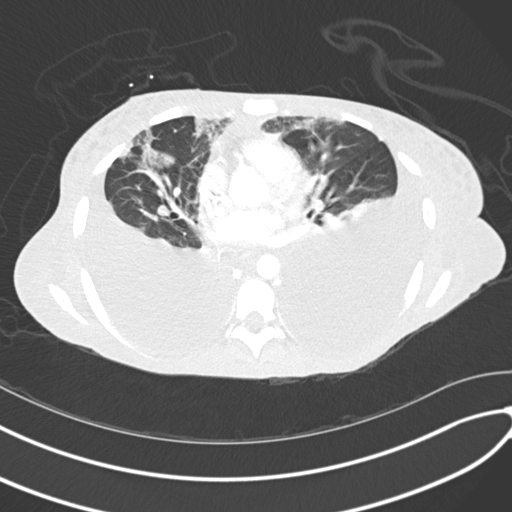
[im 90/135  lung]
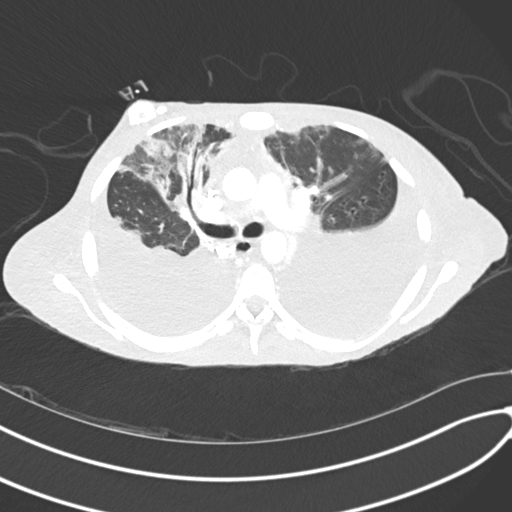
[im 99/135  lung]
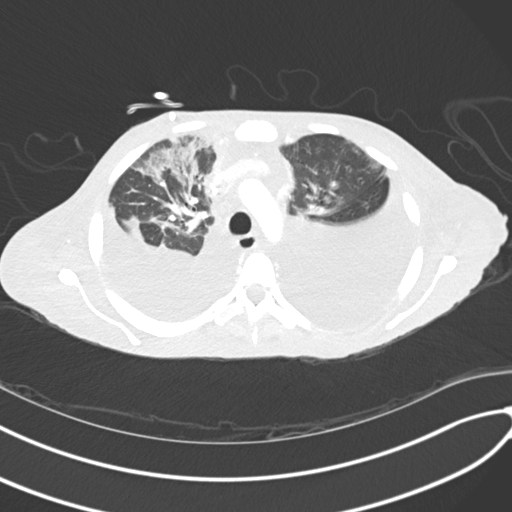
[im 108/135  mediastinal]
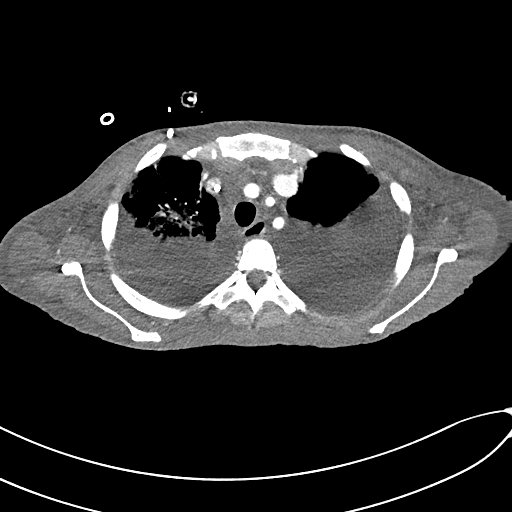
[im 108/135  lung]
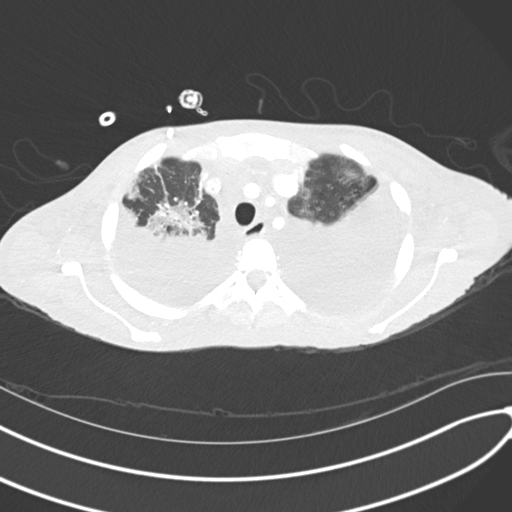
[im 126/135  lung]
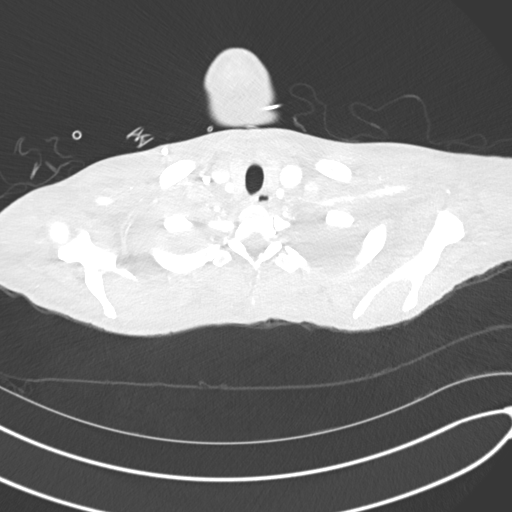

[Series 6: coronal · coronal · 0.54mm/px · 3 of 134 slices shown]
[im 27/134  lung]
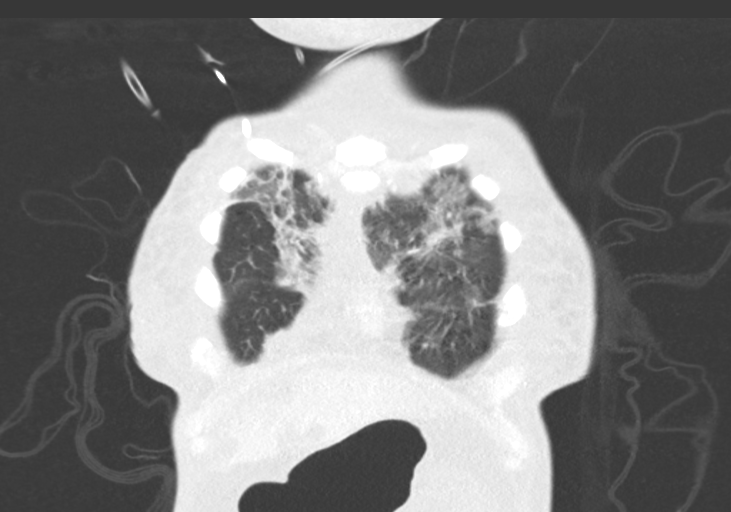
[im 54/134  lung]
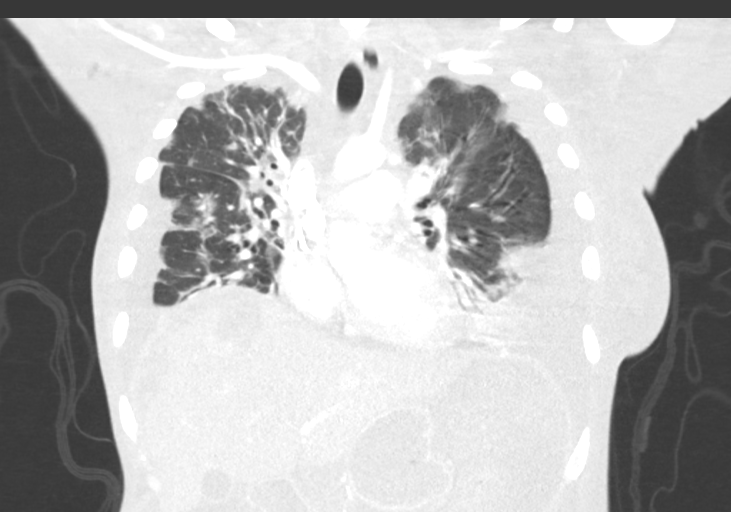
[im 80/134  lung]
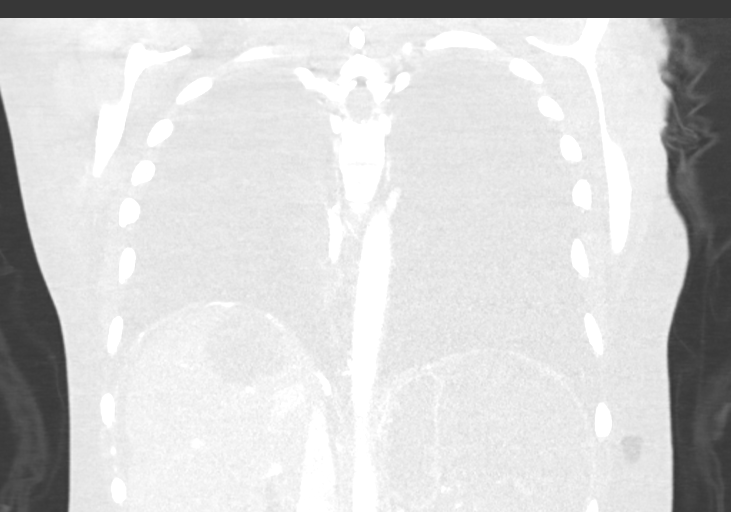

[13 of 36 positions shown; findings below may reference images not displayed]

Chest CT 07/06/2017. Outside
abdominal CT 07/28/2017 and additional abdominal CT 09/01/2017.
FINDINGS: Cardiovascular: Study was not performed as a dedicated CTA, and
there is suboptimal opacification of the pulmonary arteries.
However, there are several ill-defined filling defects within the
peripheral pulmonary arteries bilaterally, at least moderately
suspicious for pulmonary embolism, possibly subacute in age. The
central pulmonary arteries are patent. No systemic arterial
abnormalities are identified. There is a partial filling defect
along the posterior wall of the left brachiocephalic vein (best seen
on the coronal and sagittal images), suspicious for adherent
thrombus. There is possible thrombus within the right external
jugular vein and the SVC, the latter surrounding the Port-A-Cath
tip. No definite venous occlusion, although limited opacification of
the subclavian veins. Right IJ Port-A-Cath extends to the superior
cavoatrial junction. The heart size is normal. There is no
pericardial effusion.

Mediastinum/Nodes: Evaluation limited by extensive soft tissue
edema. No enlarged mediastinal, hilar or axillary lymph nodes are
seen. The thyroid gland, trachea and esophagus demonstrate no
significant findings.

Lungs/Pleura: There are new large dependent pleural effusions
bilaterally (new from prior chest CT and enlarged from more recent
abdominal CT). There is resulting dependent atelectasis in both
lungs. In addition, there is asymmetric patchy airspace disease
which is greatest within the right upper and lower lobes. No focal
pulmonary nodule or endobronchial lesion seen.

Upper abdomen: Multifocal hepatic metastatic disease is similar in
size to the most recent study. Largest lesion posteriorly in the
right lobe measures 5.0 x 4.5 cm on image 92/2 (previously 5.1 x
cm). There are new thin calcifications along the right
hemidiaphragm. Extensive peritoneal tumor in the left upper quadrant
is lower in density and less well-defined, likely partially treated.
There is a moderate amount of ascites. There is slightly increased
scalloping of the liver and spleen, attributed to peritoneal
disease. There is increased soft tissue edema throughout
subcutaneous fat.

Musculoskeletal/Chest wall: Anasarca with increased generalized soft
tissue edema. No osseous metastases identified.
IMPRESSION: 1. Anasarca with increasing generalized soft tissue edema, enlarging
bilateral pleural effusions and ascites.
2. At least moderate suspicion of bilateral subsegmental pulmonary
emboli, possibly subacute in age. Nonocclusive/adherent thrombus
along the posterior wall of the left brachiocephalic vein with
possible additional nonocclusive thrombi in the right internal
jugular vein and SVC. Limited opacification of the subclavian veins.
No signs of right heart strain. Given the patient's upper extremity
and neck swelling, consider upper extremity venous doppler
evaluation.
3. Similar appearance of treated multifocal hepatic metastatic
disease. Peritoneal tumor appears less obvious, likely partly
treated.
4. Critical Value/emergent results were called by telephone at the
time of interpretation on 09/26/2017 at [DATE] to Dr. LYNNETTE TRAPANI
, who verbally acknowledged these results.
